# Patient Record
Sex: Female | Born: 1980 | Hispanic: Yes | Marital: Married | Smoking: Never smoker
Health system: Southern US, Community
[De-identification: ages and names within clinical notes are randomized; demographics above are authoritative.]

## PROBLEM LIST (undated history)

## (undated) ENCOUNTER — Inpatient Hospital Stay (HOSPITAL_COMMUNITY): Payer: Self-pay

## (undated) DIAGNOSIS — Z789 Other specified health status: Secondary | ICD-10-CM

## (undated) DIAGNOSIS — O24419 Gestational diabetes mellitus in pregnancy, unspecified control: Secondary | ICD-10-CM

## (undated) DIAGNOSIS — N96 Recurrent pregnancy loss: Secondary | ICD-10-CM

## (undated) DIAGNOSIS — J40 Bronchitis, not specified as acute or chronic: Secondary | ICD-10-CM

## (undated) HISTORY — PX: NO PAST SURGERIES: SHX2092

## (undated) HISTORY — DX: Gestational diabetes mellitus in pregnancy, unspecified control: O24.419

---
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
## 2000-01-30 ENCOUNTER — Emergency Department (HOSPITAL_COMMUNITY): Admission: EM | Admit: 2000-01-30 | Discharge: 2000-01-30 | Payer: Self-pay | Admitting: Emergency Medicine

## 2000-02-06 ENCOUNTER — Encounter: Admission: RE | Admit: 2000-02-06 | Discharge: 2000-02-06 | Payer: Self-pay | Admitting: Family Medicine

## 2000-02-18 ENCOUNTER — Encounter: Admission: RE | Admit: 2000-02-18 | Discharge: 2000-02-18 | Payer: Self-pay | Admitting: Family Medicine

## 2000-03-26 ENCOUNTER — Encounter: Admission: RE | Admit: 2000-03-26 | Discharge: 2000-03-26 | Payer: Self-pay | Admitting: Sports Medicine

## 2000-04-05 ENCOUNTER — Ambulatory Visit (HOSPITAL_COMMUNITY): Admission: RE | Admit: 2000-04-05 | Discharge: 2000-04-05 | Payer: Self-pay | Admitting: *Deleted

## 2000-04-30 ENCOUNTER — Encounter: Admission: RE | Admit: 2000-04-30 | Discharge: 2000-04-30 | Payer: Self-pay | Admitting: Family Medicine

## 2000-05-31 ENCOUNTER — Encounter: Admission: RE | Admit: 2000-05-31 | Discharge: 2000-05-31 | Payer: Self-pay | Admitting: Family Medicine

## 2000-06-16 ENCOUNTER — Encounter: Admission: RE | Admit: 2000-06-16 | Discharge: 2000-06-16 | Payer: Self-pay | Admitting: Family Medicine

## 2000-07-01 ENCOUNTER — Encounter: Admission: RE | Admit: 2000-07-01 | Discharge: 2000-07-01 | Payer: Self-pay | Admitting: Family Medicine

## 2000-07-12 ENCOUNTER — Encounter: Admission: RE | Admit: 2000-07-12 | Discharge: 2000-07-12 | Payer: Self-pay | Admitting: Family Medicine

## 2000-07-20 ENCOUNTER — Encounter: Admission: RE | Admit: 2000-07-20 | Discharge: 2000-07-20 | Payer: Self-pay | Admitting: Sports Medicine

## 2000-07-28 ENCOUNTER — Encounter: Admission: RE | Admit: 2000-07-28 | Discharge: 2000-07-28 | Payer: Self-pay | Admitting: Family Medicine

## 2000-08-05 ENCOUNTER — Encounter: Admission: RE | Admit: 2000-08-05 | Discharge: 2000-08-05 | Payer: Self-pay | Admitting: Family Medicine

## 2000-08-11 ENCOUNTER — Encounter (HOSPITAL_COMMUNITY): Admission: RE | Admit: 2000-08-11 | Discharge: 2000-08-16 | Payer: Self-pay | Admitting: Obstetrics

## 2000-08-11 ENCOUNTER — Encounter: Admission: RE | Admit: 2000-08-11 | Discharge: 2000-08-11 | Payer: Self-pay | Admitting: Family Medicine

## 2000-08-13 ENCOUNTER — Inpatient Hospital Stay (HOSPITAL_COMMUNITY): Admission: AD | Admit: 2000-08-13 | Discharge: 2000-08-16 | Payer: Self-pay | Admitting: *Deleted

## 2000-09-16 ENCOUNTER — Encounter: Admission: RE | Admit: 2000-09-16 | Discharge: 2000-09-16 | Payer: Self-pay | Admitting: Sports Medicine

## 2002-05-17 ENCOUNTER — Encounter: Admission: RE | Admit: 2002-05-17 | Discharge: 2002-05-17 | Payer: Self-pay | Admitting: Family Medicine

## 2002-05-24 ENCOUNTER — Encounter: Admission: RE | Admit: 2002-05-24 | Discharge: 2002-05-24 | Payer: Self-pay | Admitting: Family Medicine

## 2002-05-24 ENCOUNTER — Encounter (INDEPENDENT_AMBULATORY_CARE_PROVIDER_SITE_OTHER): Payer: Self-pay | Admitting: *Deleted

## 2002-05-30 ENCOUNTER — Encounter: Admission: RE | Admit: 2002-05-30 | Discharge: 2002-05-30 | Payer: Self-pay | Admitting: Sports Medicine

## 2002-06-01 ENCOUNTER — Ambulatory Visit (HOSPITAL_COMMUNITY): Admission: RE | Admit: 2002-06-01 | Discharge: 2002-06-01 | Payer: Self-pay | Admitting: Family Medicine

## 2002-06-28 ENCOUNTER — Encounter: Admission: RE | Admit: 2002-06-28 | Discharge: 2002-06-28 | Payer: Self-pay | Admitting: Family Medicine

## 2002-07-14 ENCOUNTER — Encounter: Admission: RE | Admit: 2002-07-14 | Discharge: 2002-07-14 | Payer: Self-pay | Admitting: Family Medicine

## 2002-07-25 ENCOUNTER — Encounter: Admission: RE | Admit: 2002-07-25 | Discharge: 2002-07-25 | Payer: Self-pay | Admitting: Family Medicine

## 2002-08-01 ENCOUNTER — Encounter: Admission: RE | Admit: 2002-08-01 | Discharge: 2002-08-01 | Payer: Self-pay | Admitting: Sports Medicine

## 2002-08-09 ENCOUNTER — Encounter: Admission: RE | Admit: 2002-08-09 | Discharge: 2002-08-09 | Payer: Self-pay | Admitting: Family Medicine

## 2002-08-14 ENCOUNTER — Ambulatory Visit (HOSPITAL_COMMUNITY): Admission: RE | Admit: 2002-08-14 | Discharge: 2002-08-14 | Payer: Self-pay | Admitting: Family Medicine

## 2002-08-14 ENCOUNTER — Encounter: Admission: RE | Admit: 2002-08-14 | Discharge: 2002-08-14 | Payer: Self-pay | Admitting: *Deleted

## 2002-08-17 ENCOUNTER — Encounter: Admission: RE | Admit: 2002-08-17 | Discharge: 2002-08-17 | Payer: Self-pay | Admitting: Family Medicine

## 2002-08-17 ENCOUNTER — Encounter: Admission: RE | Admit: 2002-08-17 | Discharge: 2002-08-17 | Payer: Self-pay | Admitting: *Deleted

## 2002-08-18 ENCOUNTER — Inpatient Hospital Stay (HOSPITAL_COMMUNITY): Admission: AD | Admit: 2002-08-18 | Discharge: 2002-08-20 | Payer: Self-pay | Admitting: *Deleted

## 2002-10-09 ENCOUNTER — Encounter: Admission: RE | Admit: 2002-10-09 | Discharge: 2002-10-09 | Payer: Self-pay | Admitting: Family Medicine

## 2002-11-15 ENCOUNTER — Encounter: Admission: RE | Admit: 2002-11-15 | Discharge: 2002-11-15 | Payer: Self-pay | Admitting: Family Medicine

## 2003-02-05 ENCOUNTER — Encounter: Admission: RE | Admit: 2003-02-05 | Discharge: 2003-02-05 | Payer: Self-pay | Admitting: Family Medicine

## 2003-09-27 ENCOUNTER — Encounter: Admission: RE | Admit: 2003-09-27 | Discharge: 2003-09-27 | Payer: Self-pay | Admitting: Family Medicine

## 2003-11-01 ENCOUNTER — Encounter: Admission: RE | Admit: 2003-11-01 | Discharge: 2003-11-01 | Payer: Self-pay | Admitting: Sports Medicine

## 2004-08-25 ENCOUNTER — Ambulatory Visit: Payer: Self-pay | Admitting: Family Medicine

## 2005-11-21 ENCOUNTER — Encounter (INDEPENDENT_AMBULATORY_CARE_PROVIDER_SITE_OTHER): Payer: Self-pay | Admitting: *Deleted

## 2005-12-09 ENCOUNTER — Ambulatory Visit: Payer: Self-pay | Admitting: Family Medicine

## 2005-12-16 ENCOUNTER — Ambulatory Visit: Payer: Self-pay | Admitting: Family Medicine

## 2006-05-21 ENCOUNTER — Encounter (INDEPENDENT_AMBULATORY_CARE_PROVIDER_SITE_OTHER): Payer: Self-pay | Admitting: *Deleted

## 2006-07-15 ENCOUNTER — Ambulatory Visit: Payer: Self-pay | Admitting: Sports Medicine

## 2007-02-04 ENCOUNTER — Ambulatory Visit: Payer: Self-pay | Admitting: Family Medicine

## 2007-02-07 ENCOUNTER — Ambulatory Visit: Payer: Self-pay | Admitting: Family Medicine

## 2007-02-23 ENCOUNTER — Ambulatory Visit: Payer: Self-pay | Admitting: Family Medicine

## 2007-02-23 ENCOUNTER — Encounter (INDEPENDENT_AMBULATORY_CARE_PROVIDER_SITE_OTHER): Payer: Self-pay | Admitting: Family Medicine

## 2007-02-23 LAB — CONVERTED CEMR LAB

## 2007-02-28 ENCOUNTER — Encounter: Payer: Self-pay | Admitting: Family Medicine

## 2007-02-28 LAB — CONVERTED CEMR LAB
Basophils Relative: 0 % (ref 0–1)
Eosinophils Relative: 1 % (ref 0–5)
Lymphs Abs: 1.8 10*3/uL (ref 0.7–4.0)
MCV: 90.7 fL (ref 78.0–100.0)
Neutro Abs: 4.8 10*3/uL (ref 1.7–7.7)
Neutrophils Relative %: 69 % (ref 43–77)
Platelets: 266 10*3/uL (ref 150–400)
RBC: 4.42 M/uL (ref 3.87–5.11)
RDW: 13.9 % (ref 11.5–15.5)
Rh Type: POSITIVE

## 2007-03-02 ENCOUNTER — Encounter (INDEPENDENT_AMBULATORY_CARE_PROVIDER_SITE_OTHER): Payer: Self-pay | Admitting: Family Medicine

## 2007-03-02 ENCOUNTER — Ambulatory Visit: Payer: Self-pay | Admitting: Family Medicine

## 2007-03-02 DIAGNOSIS — E669 Obesity, unspecified: Secondary | ICD-10-CM | POA: Insufficient documentation

## 2007-03-02 LAB — CONVERTED CEMR LAB
Blood in Urine, dipstick: NEGATIVE
Glucose, Urine, Semiquant: NEGATIVE
Nitrite: NEGATIVE
Urobilinogen, UA: 1
WBC Urine, dipstick: NEGATIVE
Whiff Test: NEGATIVE
pH: 8.5

## 2007-03-03 ENCOUNTER — Telehealth (INDEPENDENT_AMBULATORY_CARE_PROVIDER_SITE_OTHER): Payer: Self-pay | Admitting: *Deleted

## 2007-03-07 ENCOUNTER — Ambulatory Visit (HOSPITAL_COMMUNITY): Admission: RE | Admit: 2007-03-07 | Discharge: 2007-03-07 | Payer: Self-pay | Admitting: Sports Medicine

## 2007-03-07 ENCOUNTER — Encounter (INDEPENDENT_AMBULATORY_CARE_PROVIDER_SITE_OTHER): Payer: Self-pay | Admitting: *Deleted

## 2007-03-08 ENCOUNTER — Ambulatory Visit: Payer: Self-pay | Admitting: Family Medicine

## 2007-03-08 LAB — CONVERTED CEMR LAB: GTT: 168

## 2007-03-10 ENCOUNTER — Ambulatory Visit: Payer: Self-pay | Admitting: Family Medicine

## 2007-03-10 ENCOUNTER — Encounter (INDEPENDENT_AMBULATORY_CARE_PROVIDER_SITE_OTHER): Payer: Self-pay | Admitting: Family Medicine

## 2007-03-14 ENCOUNTER — Encounter (INDEPENDENT_AMBULATORY_CARE_PROVIDER_SITE_OTHER): Payer: Self-pay | Admitting: *Deleted

## 2007-03-18 ENCOUNTER — Inpatient Hospital Stay (HOSPITAL_COMMUNITY): Admission: AD | Admit: 2007-03-18 | Discharge: 2007-03-18 | Payer: Self-pay | Admitting: Obstetrics and Gynecology

## 2007-03-20 ENCOUNTER — Inpatient Hospital Stay (HOSPITAL_COMMUNITY): Admission: AD | Admit: 2007-03-20 | Discharge: 2007-03-21 | Payer: Self-pay | Admitting: Obstetrics & Gynecology

## 2007-03-20 ENCOUNTER — Encounter: Payer: Self-pay | Admitting: Obstetrics & Gynecology

## 2007-03-23 ENCOUNTER — Telehealth (INDEPENDENT_AMBULATORY_CARE_PROVIDER_SITE_OTHER): Payer: Self-pay | Admitting: *Deleted

## 2007-04-06 ENCOUNTER — Ambulatory Visit: Payer: Self-pay | Admitting: Family Medicine

## 2007-04-06 LAB — CONVERTED CEMR LAB: Whiff Test: NEGATIVE

## 2007-07-22 ENCOUNTER — Encounter: Payer: Self-pay | Admitting: *Deleted

## 2007-08-03 ENCOUNTER — Ambulatory Visit: Payer: Self-pay | Admitting: Family Medicine

## 2007-10-17 ENCOUNTER — Encounter (INDEPENDENT_AMBULATORY_CARE_PROVIDER_SITE_OTHER): Payer: Self-pay | Admitting: Family Medicine

## 2007-10-17 ENCOUNTER — Ambulatory Visit: Payer: Self-pay | Admitting: Family Medicine

## 2007-10-17 LAB — CONVERTED CEMR LAB
Basophils Absolute: 0 10*3/uL (ref 0.0–0.1)
Hepatitis B Surface Ag: NEGATIVE
Lymphocytes Relative: 25 % (ref 12–46)
MCHC: 33 g/dL (ref 30.0–36.0)
MCV: 90.2 fL (ref 78.0–100.0)
Monocytes Absolute: 0.3 10*3/uL (ref 0.1–1.0)
Monocytes Relative: 4 % (ref 3–12)
Neutro Abs: 5 10*3/uL (ref 1.7–7.7)
Neutrophils Relative %: 69 % (ref 43–77)
Platelets: 251 10*3/uL (ref 150–400)
WBC: 7.3 10*3/uL (ref 4.0–10.5)

## 2007-10-24 ENCOUNTER — Encounter (INDEPENDENT_AMBULATORY_CARE_PROVIDER_SITE_OTHER): Payer: Self-pay | Admitting: Family Medicine

## 2007-10-24 ENCOUNTER — Ambulatory Visit: Payer: Self-pay | Admitting: Sports Medicine

## 2007-10-24 LAB — CONVERTED CEMR LAB
Chlamydia, DNA Probe: NEGATIVE
GC Probe Amp, Genital: NEGATIVE
Glucose, Urine, Semiquant: NEGATIVE

## 2007-10-25 ENCOUNTER — Encounter (INDEPENDENT_AMBULATORY_CARE_PROVIDER_SITE_OTHER): Payer: Self-pay | Admitting: Family Medicine

## 2007-10-26 ENCOUNTER — Ambulatory Visit: Payer: Self-pay | Admitting: Family Medicine

## 2007-10-26 LAB — CONVERTED CEMR LAB: GTT, 1 hr: 163 mg/dL

## 2007-11-01 ENCOUNTER — Encounter (INDEPENDENT_AMBULATORY_CARE_PROVIDER_SITE_OTHER): Payer: Self-pay | Admitting: Family Medicine

## 2007-11-01 ENCOUNTER — Ambulatory Visit: Payer: Self-pay | Admitting: Family Medicine

## 2007-11-18 ENCOUNTER — Encounter (INDEPENDENT_AMBULATORY_CARE_PROVIDER_SITE_OTHER): Payer: Self-pay | Admitting: Family Medicine

## 2007-11-18 ENCOUNTER — Ambulatory Visit: Payer: Self-pay | Admitting: Family Medicine

## 2007-11-18 LAB — CONVERTED CEMR LAB: Glucose, Urine, Semiquant: NEGATIVE

## 2007-12-13 ENCOUNTER — Encounter (INDEPENDENT_AMBULATORY_CARE_PROVIDER_SITE_OTHER): Payer: Self-pay | Admitting: Family Medicine

## 2007-12-13 ENCOUNTER — Ambulatory Visit (HOSPITAL_COMMUNITY): Admission: RE | Admit: 2007-12-13 | Discharge: 2007-12-13 | Payer: Self-pay | Admitting: Family Medicine

## 2007-12-19 ENCOUNTER — Ambulatory Visit: Payer: Self-pay | Admitting: Family Medicine

## 2007-12-19 LAB — CONVERTED CEMR LAB: Glucose, Urine, Semiquant: NEGATIVE

## 2008-01-12 ENCOUNTER — Telehealth: Payer: Self-pay | Admitting: *Deleted

## 2008-01-13 ENCOUNTER — Ambulatory Visit: Payer: Self-pay | Admitting: Family Medicine

## 2008-01-17 ENCOUNTER — Ambulatory Visit: Payer: Self-pay | Admitting: Family Medicine

## 2008-01-17 LAB — CONVERTED CEMR LAB: Glucose, Urine, Semiquant: NEGATIVE

## 2008-02-13 ENCOUNTER — Encounter (INDEPENDENT_AMBULATORY_CARE_PROVIDER_SITE_OTHER): Payer: Self-pay | Admitting: Family Medicine

## 2008-02-13 ENCOUNTER — Ambulatory Visit: Payer: Self-pay | Admitting: Family Medicine

## 2008-02-13 LAB — CONVERTED CEMR LAB

## 2008-02-20 ENCOUNTER — Ambulatory Visit: Payer: Self-pay | Admitting: Family Medicine

## 2008-02-20 ENCOUNTER — Encounter (INDEPENDENT_AMBULATORY_CARE_PROVIDER_SITE_OTHER): Payer: Self-pay | Admitting: Family Medicine

## 2008-02-28 ENCOUNTER — Ambulatory Visit: Payer: Self-pay | Admitting: Family Medicine

## 2008-03-13 ENCOUNTER — Ambulatory Visit: Payer: Self-pay | Admitting: Family Medicine

## 2008-03-13 ENCOUNTER — Encounter (INDEPENDENT_AMBULATORY_CARE_PROVIDER_SITE_OTHER): Payer: Self-pay | Admitting: Family Medicine

## 2008-03-29 ENCOUNTER — Ambulatory Visit: Payer: Self-pay | Admitting: Family Medicine

## 2008-04-12 ENCOUNTER — Encounter (INDEPENDENT_AMBULATORY_CARE_PROVIDER_SITE_OTHER): Payer: Self-pay | Admitting: Family Medicine

## 2008-04-12 ENCOUNTER — Ambulatory Visit: Payer: Self-pay | Admitting: Family Medicine

## 2008-04-18 ENCOUNTER — Encounter (INDEPENDENT_AMBULATORY_CARE_PROVIDER_SITE_OTHER): Payer: Self-pay | Admitting: Family Medicine

## 2008-04-19 ENCOUNTER — Ambulatory Visit: Payer: Self-pay | Admitting: Family Medicine

## 2008-04-26 ENCOUNTER — Ambulatory Visit: Payer: Self-pay | Admitting: Family Medicine

## 2008-05-02 ENCOUNTER — Ambulatory Visit: Payer: Self-pay | Admitting: Family Medicine

## 2008-05-09 ENCOUNTER — Ambulatory Visit: Payer: Self-pay | Admitting: Family Medicine

## 2008-05-11 ENCOUNTER — Ambulatory Visit: Payer: Self-pay | Admitting: Obstetrics & Gynecology

## 2008-05-15 ENCOUNTER — Ambulatory Visit: Payer: Self-pay | Admitting: Family Medicine

## 2008-05-15 ENCOUNTER — Ambulatory Visit: Payer: Self-pay | Admitting: Obstetrics & Gynecology

## 2008-05-17 ENCOUNTER — Inpatient Hospital Stay (HOSPITAL_COMMUNITY): Admission: AD | Admit: 2008-05-17 | Discharge: 2008-05-18 | Payer: Self-pay | Admitting: Obstetrics & Gynecology

## 2008-05-17 ENCOUNTER — Ambulatory Visit: Payer: Self-pay | Admitting: Advanced Practice Midwife

## 2008-05-17 ENCOUNTER — Ambulatory Visit: Payer: Self-pay | Admitting: Family Medicine

## 2008-06-28 ENCOUNTER — Ambulatory Visit: Payer: Self-pay | Admitting: Family Medicine

## 2008-06-28 DIAGNOSIS — R51 Headache: Secondary | ICD-10-CM | POA: Insufficient documentation

## 2008-06-28 DIAGNOSIS — R300 Dysuria: Secondary | ICD-10-CM | POA: Insufficient documentation

## 2008-06-28 DIAGNOSIS — R519 Headache, unspecified: Secondary | ICD-10-CM | POA: Insufficient documentation

## 2008-06-28 LAB — CONVERTED CEMR LAB
Ketones, urine, test strip: NEGATIVE
Nitrite: NEGATIVE
Protein, U semiquant: NEGATIVE
Specific Gravity, Urine: 1.015
Urobilinogen, UA: 0.2
WBC Urine, dipstick: NEGATIVE
pH: 7

## 2008-08-07 ENCOUNTER — Telehealth (INDEPENDENT_AMBULATORY_CARE_PROVIDER_SITE_OTHER): Payer: Self-pay | Admitting: *Deleted

## 2008-08-15 ENCOUNTER — Ambulatory Visit: Payer: Self-pay | Admitting: Family Medicine

## 2008-08-15 ENCOUNTER — Encounter (INDEPENDENT_AMBULATORY_CARE_PROVIDER_SITE_OTHER): Payer: Self-pay | Admitting: Family Medicine

## 2008-08-15 LAB — CONVERTED CEMR LAB: Beta hcg, urine, semiquantitative: NEGATIVE

## 2008-11-06 ENCOUNTER — Ambulatory Visit: Payer: Self-pay | Admitting: Family Medicine

## 2008-12-15 IMAGING — US US OB DETAIL+14 WK
2 series · 14 of 28 positions shown · non-contrast
Comparison: none

OBSTETRICAL ULTRASOUND:
 This ultrasound exam was performed in the [HOSPITAL] Ultrasound Department.  The OB US report was generated in the AS system, and faxed to the ordering physician.  This report is also available in [REDACTED] PACS.

[Series 1: us ob detail +14 wk · 76 acquisitions, 13 frames shown (1 of 2)]
[im 3/76]
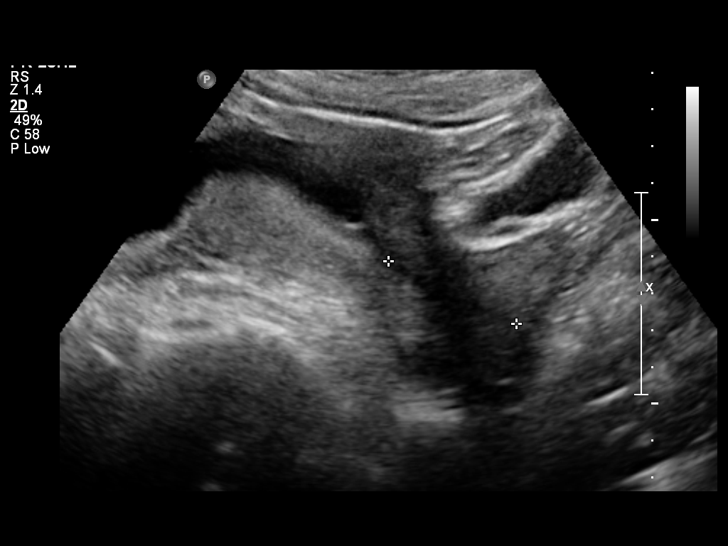
[im 9/76]
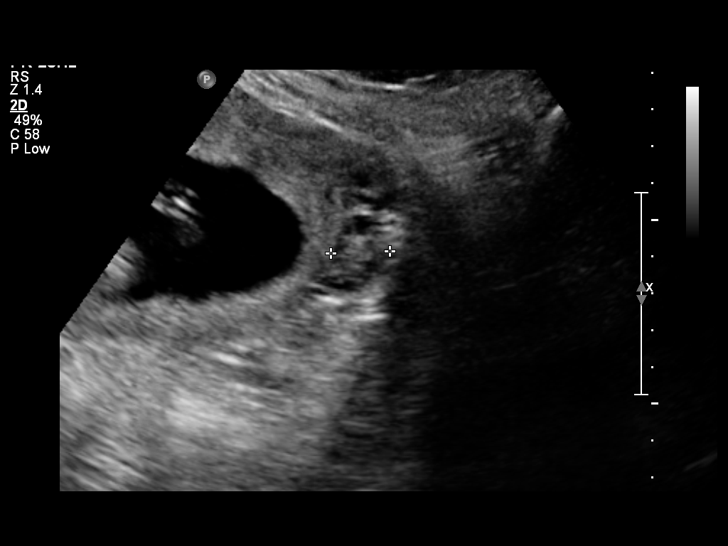
[im 15/76]
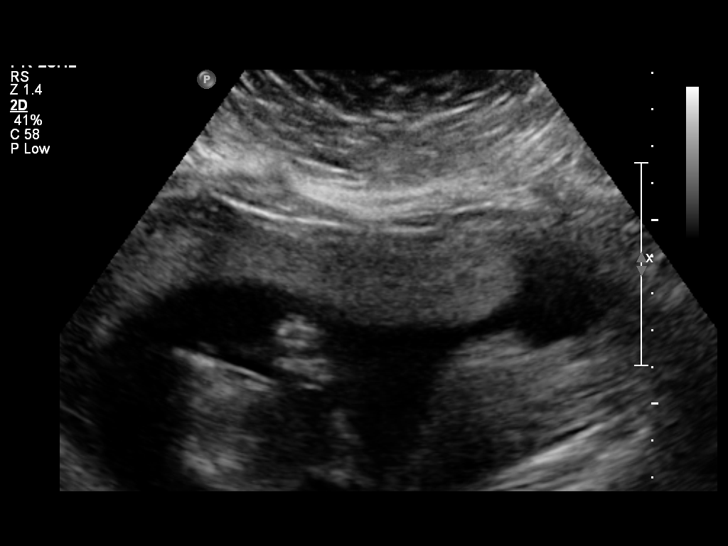
[im 21/76]
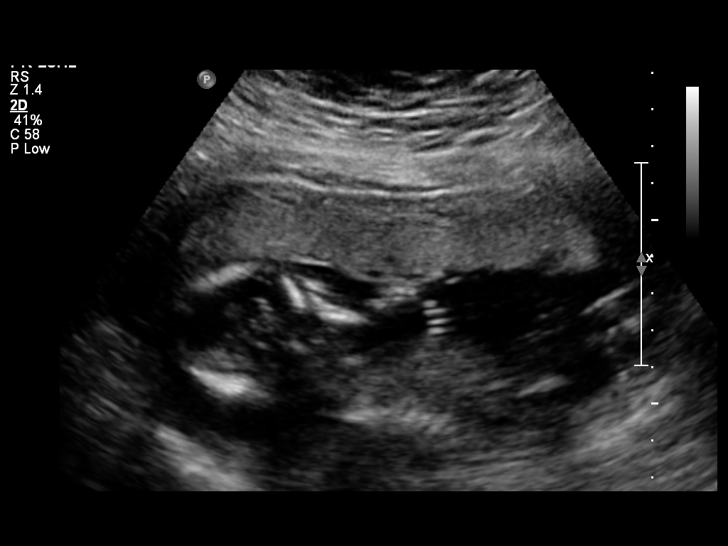
[im 26/76]
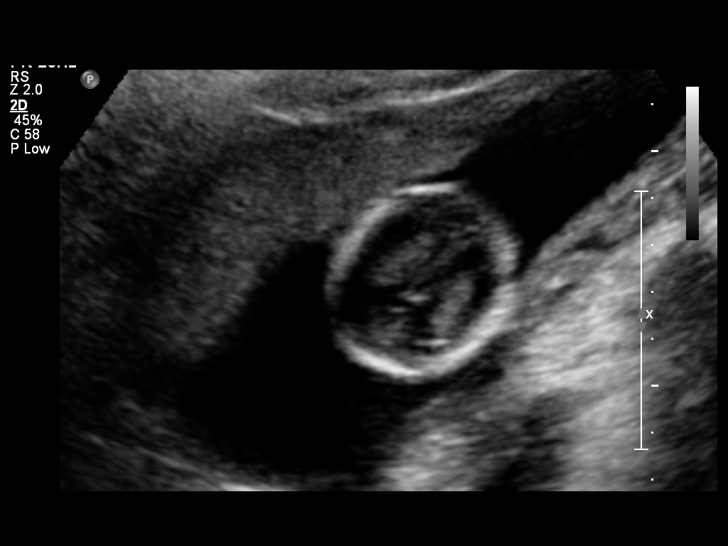
[im 32/76]
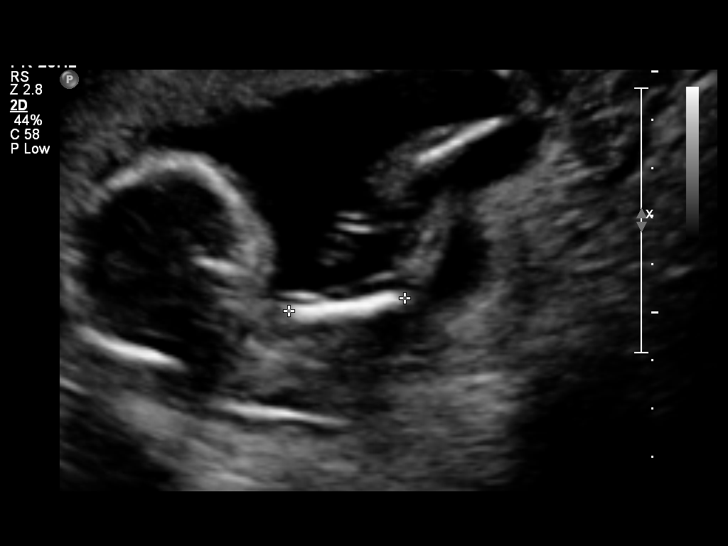
[im 38/76]
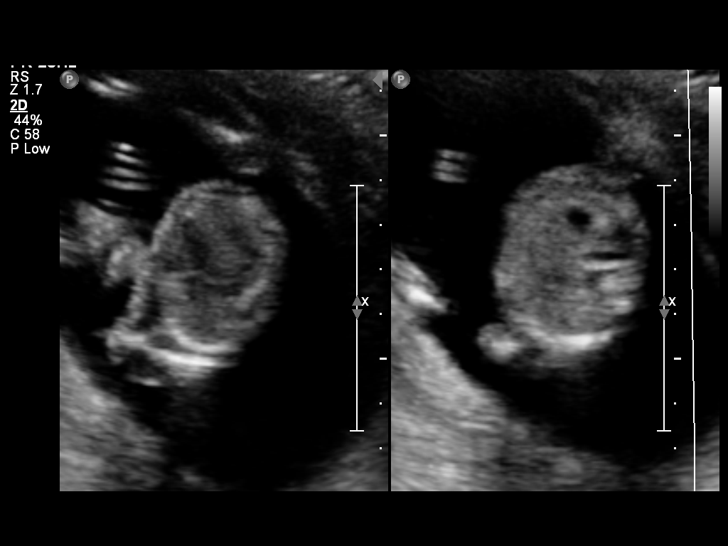
[im 44/76]
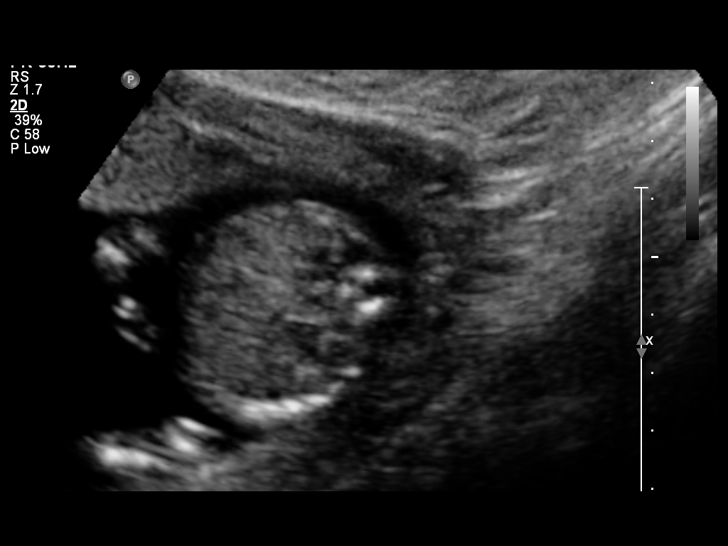
[im 50/76]
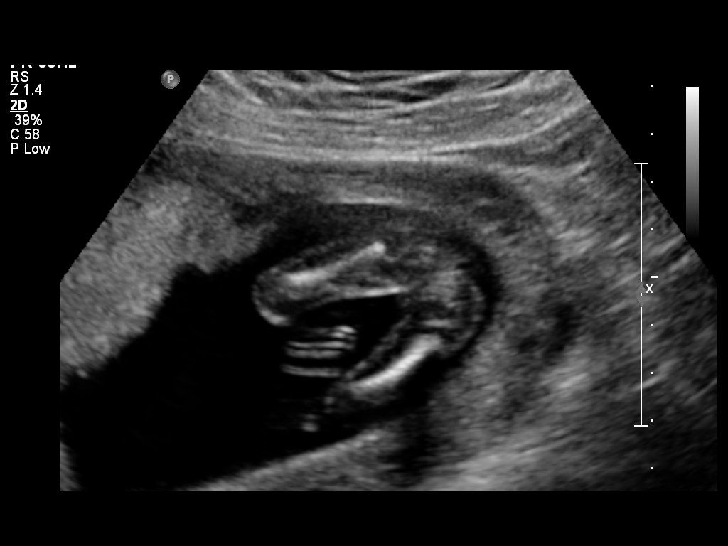
[im 55/76]
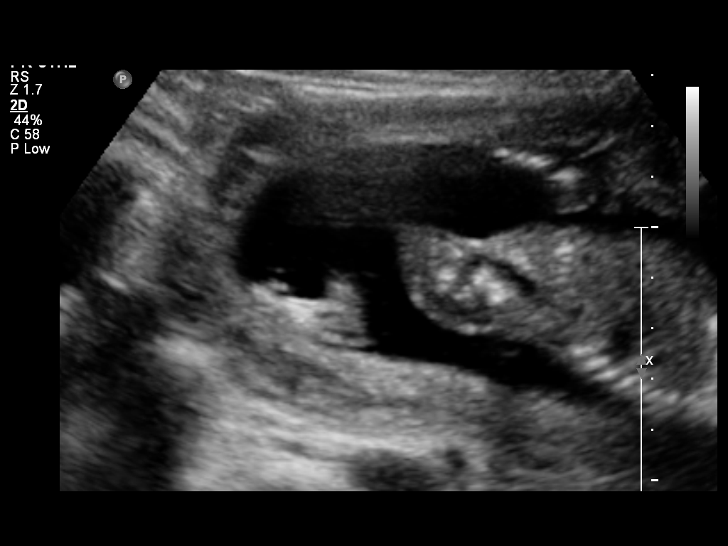
[im 61/76]
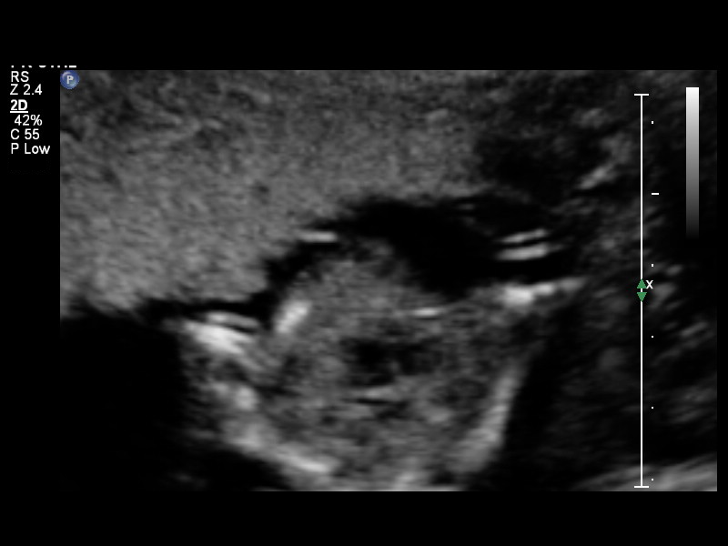
[im 67/76]
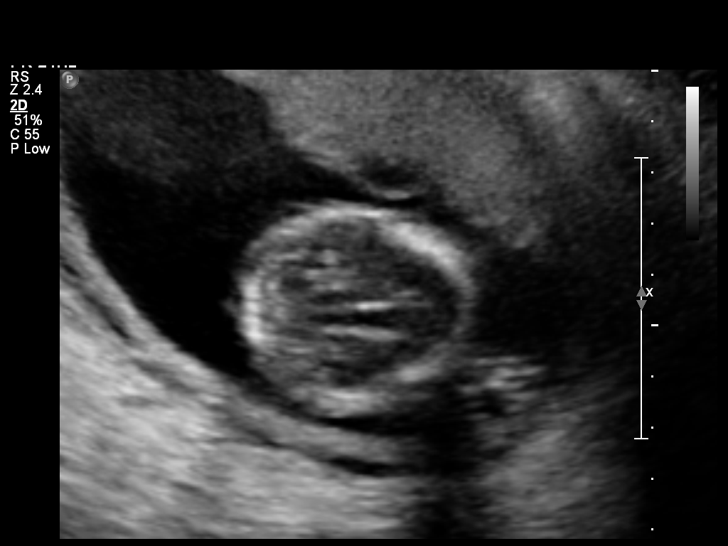
[im 73/76]
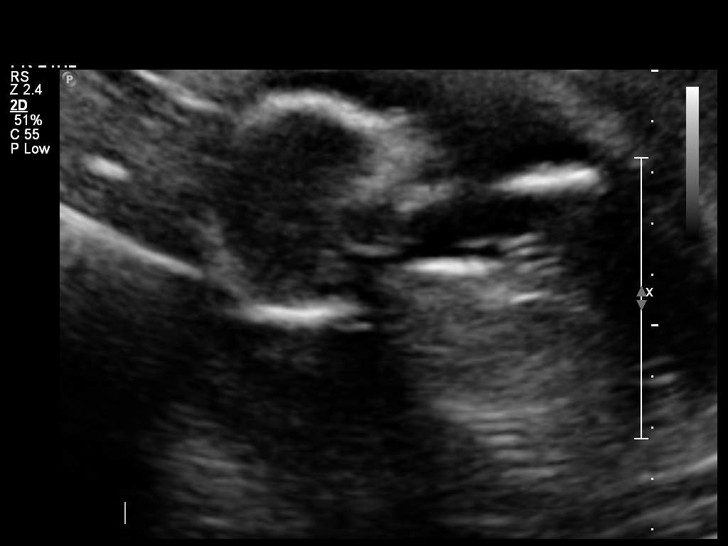

[Series 1: us ob detail +14 wk · 1 of 2 slices shown (2 of 2)]
[im 1/2]
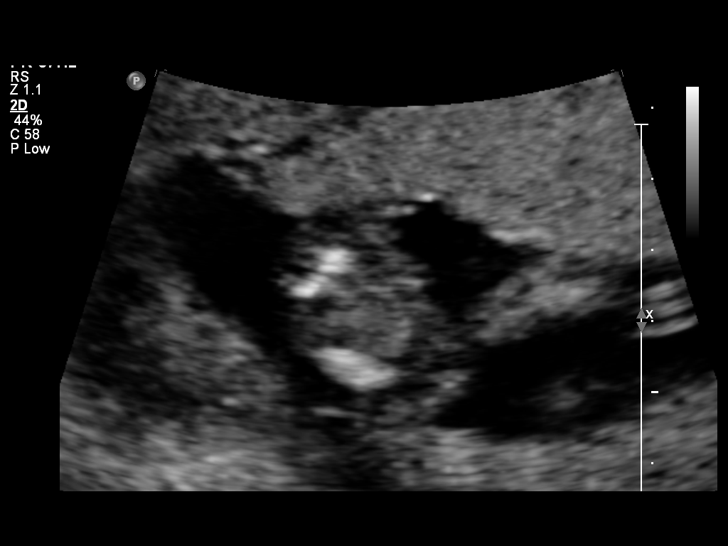

[14 of 28 positions shown; findings below may reference images not displayed]

IMPRESSION: See AS Obstetric US report.

## 2009-06-03 ENCOUNTER — Inpatient Hospital Stay (HOSPITAL_COMMUNITY): Admission: AD | Admit: 2009-06-03 | Discharge: 2009-06-03 | Payer: Self-pay | Admitting: Obstetrics & Gynecology

## 2009-06-07 ENCOUNTER — Encounter: Payer: Self-pay | Admitting: Family Medicine

## 2009-06-11 ENCOUNTER — Encounter (INDEPENDENT_AMBULATORY_CARE_PROVIDER_SITE_OTHER): Payer: Self-pay | Admitting: Family Medicine

## 2009-06-11 ENCOUNTER — Ambulatory Visit: Payer: Self-pay | Admitting: Family Medicine

## 2009-06-11 LAB — CONVERTED CEMR LAB
MCV: 93.5 fL (ref 78.0–100.0)
Platelets: 219 10*3/uL (ref 150–400)
WBC: 4.9 10*3/uL (ref 4.0–10.5)
hCG, Beta Chain, Quant, S: 14.5 milliintl units/mL

## 2009-06-12 ENCOUNTER — Telehealth: Payer: Self-pay | Admitting: *Deleted

## 2010-04-13 ENCOUNTER — Encounter: Payer: Self-pay | Admitting: Sports Medicine

## 2010-04-13 ENCOUNTER — Encounter: Payer: Self-pay | Admitting: Family Medicine

## 2010-04-22 NOTE — Assessment & Plan Note (Signed)
Summary: ob visit/per ennis/eo   Vital Signs:  Patient Profile:   30 Years Old Female Height:     60 inches Weight:      180.1 pounds BMI:     35.30 Pulse rate:   96 / minute BP sitting:   101 / 71  (left arm)  Pt. in pain?   no  Vitals Entered By: Modesta Messing LPN (April 19, 2008 1:48 PM)                  Chief Complaint:  Follow up OB..    Current Allergies: No known allergies     Risk Factors:       Impression & Recommendations:  Problem # 1:  PREGNANCY, NORMAL, MULTIGRAVIDA (ICD-V22.1) Assessment: Unchanged 30 yo O+ [id number] at 36 6/7 (EDC 05/11/08) by sure LMP (18 week Anatomy scan [facility] 05/19/08 - thus kept LMP date).  Had 1st trimester SAB in 12/08.  Taking Prenatal vitamins.  Declined Integrated/Quad Screen and CF Screening.  Anatomy scan normal.  Passed early 3 hr GTT screen.  Failed 1 hr GTT, but passed 3 hr GTT at 27 weeks.   Gaining weight appropriately - not too much.  HIV, RPR, and Hgb normal at 27 weeks.  H1N1 and Flu vaccine given previously. GC/CT negative. GBS negative.  FHT reassuring with good fetal movement.  RTC in 1 week.  Labor precautions.    Baby Girl, Mia Pediatrician: [facility] Breast/Bottle Feeding:  Breast PP Contraception:  Depo prior to leaving hospital, then Mirena   Orders: Other OB visit- [facility] ([facility])   Complete Medication List: 1)  Prenatal Vitamins 0.8 Mg Tabs (Prenatal multivit-min-fe-fa) .... One daily   Patient Instructions: 1)  Please schedule a return OB visit in 1 week with Dr. Deirdre Peer. 2)  Llame la clinica si tiene mas pression, calambres, contracciones, flujo, o sangrando.  Tambien llame si no puede sentir su bebe [employer]. 3)  Si no hay alguien a la clinica y tiene problemas, vaya a la hospital de [place]. 4)  Continue sus vitaminas diario.      Flowsheet View for Follow-up Visit    Estimated weeks of       gestation:     36 6/7    Weight:     180.1    Blood pressure:   101 / 71    Hx headache?     No  Nausea/vomiting?   No    Edema?     TrLE    Bleeding?     no    Leakage/discharge?   no    Fetal activity:       yes    Labor symptoms?   few ctx    Fundal height:      38    FHR:       140s    Cx dilation:     FT    Cx effacement:   30%    Fetal station:     -3    Taking Vitamins?   Y    Smoking PPD:   n/a    Comment:     Doing well. No concerns. Few contractions.     Next visit:     1 wk    Resident:     Earlene Plater    Preceptor:     Chambliss  Prenatal Visit      This is a 30 years old female G4, T2, PT0, LC2 who is in for a prenatal visit.  Since the last visit, she notes that she is doing well and has no concerns.

## 2010-04-22 NOTE — Progress Notes (Signed)
  Phone Note Outgoing Call   Call placed by: Jettie Pagan MD Call placed to: Patient Reason for Call: Discuss lab or test results Summary of Call: Spoke with patient using interpreter Eda Royal at [facility], regarding results of labs for follow up on SAB (miscarriage).  BHCG 14.5, trended down appropriately and CBC normal.  At this time a TVUS is not necessary as labs are reassuring that SAB is now complete.  Patient states pain relieved with Ibuprofen.  Patient is self-pay.  Please cancel Ultrasound.  Will also refill her OCPs at her request.  She would like an IUD placed in the near future (6 wks post SAB).  She will call to make an appointment for IUD placement.  Thank you - Jettie Pagan MD  Follow-up for Phone Call        Ultrasound canceled. Follow-up by: Garen Grams LPN,  June 12, 2009 11:54 AM    Prescriptions: SPRINTEC 28 0.25-35 MG-MCG TABS (NORGESTIMATE-ETH ESTRADIOL) One tablet by mouth at the same time each day for birth control. Start the first pill on Sunday 11/11/08.  #1 x 12   Entered and Authorized by:   Leslie Drapiza MD   Signed by:   Leslie Drapiza MD on 06/12/2009   Method used:   [facility]  [address] [id number]       [address]       [place], [place]  [address]       Ph: [phone]       Fax: [phone]   RxID:   [id number]

## 2010-04-22 NOTE — Assessment & Plan Note (Signed)
Summary: f/u miscarriage/[place]/breen   Vital Signs:  Patient profile:   30 year old female Height:      60 inches Weight:      169 pounds BMI:     33.12 BSA:     1.74 Temp:     97.9 degrees F Pulse rate:   67 / minute BP sitting:   117 / 79  Vitals Entered By: Jone Baseman CMA (June 11, 2009 9:09 AM) CC: f/u miscarriage Is Patient Diabetic? No Pain Assessment Patient in pain? yes     Location: stomach Intensity: 4 Type: cramps   Primary Care Provider:  Drue Dun MD  CC:  f/u miscarriage.  History of Present Illness: Patient is a [id number] hispainc female, here today for continued pain after miscarriage one week ago.  She was seen at [facility] [place] at that time c/w Missed Ab.  Patient states she went home and the next day passed tissue that looked like a small sac.  She continued to bleed for 4 days, then it stopped abruptly. She has been taking Ibuprofen 200mg  two times a day for the pain.  Although the pregnancy was unintentional, she was not on birth control at the time of conception. She was on OCPs previously.  We discussed that her Ibuprofen dose was likely not high enough.  Her main concern is that this miscarriage was very different from her last one, where she bled for much longer and her pain was not as severe.  Interview conducted with Spanish interpreter.  Habits & Providers  Alcohol-Tobacco-Diet     Tobacco Status: never  Allergies: No Known Drug Allergies  Physical Exam  General:  Well-developed,well-nourished,in no acute distress; alert,appropriate and cooperative throughout examination Lungs:  Normal respiratory effort, chest expands symmetrically. Lungs are clear to auscultation, no crackles or wheezes. Heart:  Normal rate and regular rhythm. S1 and S2 normal without gallop, murmur, click, rub or other extra sounds. Abdomen:  Bowel sounds positive,abdomen soft and  mildly tender in suprapubic area and LLQ, without masses.   Impression &  Recommendations:  Problem # 1:  MISSED ABORTION (ICD-632) Will check BHCG to insure it is trending down.  Will also check CBC and TVUS to r/o retained POCs or other pathology.  Will contact patient with results. [phone]. Rx for higher dose of Ibuprofen.  Orders: CBC-[facility] ([address]) B-HCG Quant-[facility] ([id number]) Ultrasound (Ultrasound) [facility]- Est Level  3 ([address]657)  Complete Medication List: 1)  Prenatal Vitamins 0.8 Mg Tabs (Prenatal multivit-min-fe-fa) .... One daily 2)  Sprintec 28 0.25-35 Mg-mcg Tabs (Norgestimate-eth estradiol) .... One tablet by mouth at the same time each day for birth control. start the first pill on sunday 11/11/08. 3)  Ibuprofen 600 Mg Tabs (Ibuprofen) .... One tablet by mouth q 6 hours as needed pain Prescriptions: IBUPROFEN 600 MG TABS (IBUPROFEN) one tablet by mouth q 6 hours as needed pain  #30 x 0   Entered and Authorized by:   Ubaldo Daywalt MD   Signed by:   Kirrah Mustin MD on 06/11/2009   Method used:   [facility]  [address] [id number]       [address]       [place], [place]  [address]       Ph: [phone]       Fax: [phone]   RxID:   [id number]

## 2010-04-22 NOTE — Miscellaneous (Signed)
Summary: pain  Clinical Lists Changes pt called c/o pain from miscarriage . work in at [employer]. interpretor arranged.Golden Circle RN  June 07, 2009 9:17 AM

## 2010-05-14 ENCOUNTER — Encounter: Payer: Self-pay | Admitting: *Deleted

## 2010-06-13 LAB — CBC
MCHC: 34.2 g/dL (ref 30.0–36.0)
MCV: 92 fL (ref 78.0–100.0)
Platelets: 197 10*3/uL (ref 150–400)
RBC: 4.03 MIL/uL (ref 3.87–5.11)
WBC: 5.6 10*3/uL (ref 4.0–10.5)

## 2010-06-13 LAB — POCT PREGNANCY, URINE: Preg Test, Ur: POSITIVE

## 2010-06-13 LAB — GC/CHLAMYDIA PROBE AMP, GENITAL: Chlamydia, DNA Probe: NEGATIVE

## 2010-06-13 LAB — WET PREP, GENITAL: Trich, Wet Prep: NONE SEEN

## 2010-06-13 LAB — URINALYSIS, ROUTINE W REFLEX MICROSCOPIC
Bilirubin Urine: NEGATIVE
Ketones, ur: NEGATIVE mg/dL
Leukocytes, UA: NEGATIVE
Nitrite: NEGATIVE
Urobilinogen, UA: 0.2 mg/dL (ref 0.0–1.0)
pH: 5.5 (ref 5.0–8.0)

## 2010-06-13 LAB — HCG, QUANTITATIVE, PREGNANCY: hCG, Beta Chain, Quant, S: 5991 m[IU]/mL — ABNORMAL HIGH (ref <5)

## 2010-06-13 LAB — ABO/RH: ABO/RH(D): O POS

## 2010-07-08 LAB — CBC
HCT: 30.5 % — ABNORMAL LOW (ref 36.0–46.0)
HCT: 36.5 % (ref 36.0–46.0)
Hemoglobin: 10.3 g/dL — ABNORMAL LOW (ref 12.0–15.0)
Hemoglobin: 12.1 g/dL (ref 12.0–15.0)
MCHC: 33.7 g/dL (ref 30.0–36.0)
MCV: 86.7 fL (ref 78.0–100.0)
MCV: 86.8 fL (ref 78.0–100.0)
RBC: 3.52 MIL/uL — ABNORMAL LOW (ref 3.87–5.11)
RDW: 17.5 % — ABNORMAL HIGH (ref 11.5–15.5)
WBC: 7.3 10*3/uL (ref 4.0–10.5)

## 2010-11-18 ENCOUNTER — Encounter: Payer: Self-pay | Admitting: Family Medicine

## 2010-11-18 ENCOUNTER — Other Ambulatory Visit (HOSPITAL_COMMUNITY)
Admission: RE | Admit: 2010-11-18 | Discharge: 2010-11-18 | Disposition: A | Discharge status: Home / Self Care | Payer: Self-pay | Source: Ambulatory Visit | Attending: Family Medicine | Admitting: Family Medicine

## 2010-11-18 ENCOUNTER — Ambulatory Visit (INDEPENDENT_AMBULATORY_CARE_PROVIDER_SITE_OTHER): Payer: Self-pay | Admitting: Family Medicine

## 2010-11-18 VITALS — BP 119/78 | HR 72 | Wt 175.0 lb

## 2010-11-18 DIAGNOSIS — R300 Dysuria: Secondary | ICD-10-CM

## 2010-11-18 DIAGNOSIS — Z124 Encounter for screening for malignant neoplasm of cervix: Secondary | ICD-10-CM

## 2010-11-18 DIAGNOSIS — Z01419 Encounter for gynecological examination (general) (routine) without abnormal findings: Secondary | ICD-10-CM | POA: Insufficient documentation

## 2010-11-18 DIAGNOSIS — Z309 Encounter for contraceptive management, unspecified: Secondary | ICD-10-CM | POA: Insufficient documentation

## 2010-11-18 LAB — POCT UA - MICROSCOPIC ONLY

## 2010-11-18 LAB — POCT URINALYSIS DIPSTICK
Ketones, UA: NEGATIVE
Protein, UA: NEGATIVE
Spec Grav, UA: 1.025
pH, UA: 5.5

## 2010-11-18 MED ORDER — NORGESTIMATE-ETH ESTRADIOL 0.25-35 MG-MCG PO TABS: 1.0000 | ORAL_TABLET | Freq: Every day | ORAL | Status: DC

## 2010-11-18 NOTE — Assessment & Plan Note (Signed)
Patient prefers to have IUD removed and return to OCPs.  Nonsmoker, previously tolerated SPrintec without difficulty. IUD removed today.  Counseled to use alternative method to prevent pregnancy for the first month of OCP use.  PAP collected today.  UA collected due to ROS positive for occasional dysuria, UA negative.

## 2010-11-18 NOTE — Progress Notes (Signed)
  Subjective:    Patient ID: Amber Branch, female    DOB: 1980/06/11, 30 y.o.   MRN: [id number]  HPI Visit conducted in Spanish.  Patient comes in with complaint of pelvic bloating and dyspareunia that began after placement of Mirena IUD in March 2011 at [facility] Department.  She has been uncomfortable with the absence of menses which disappeared about 4 months after IUD placement. Feels emotional lability which started after IUD placement.  She did not ever have depressive symptoms before; now she is prone to crying without cause, Gets aggravated with children very easily.   Gets tired easily.   Would like the IUD removed; prefers to have menses.  Had been on Sprintec OCPs before IUD; tolerated them well. Switched to IUD for convenience.   Review of Systems  Denies fevers or chjlls.  Denies vaginal discharge.  Reports occasional urinary discomfort when she voids.      Objective:   Physical Exam Well appearing, no apparent distress.  HEENT Neck supple. MMM Pelvic: Abdomen soft and nontender. No masses.  No CMT on bimanual. Cervix not friable.  IUD strings visible.  Removed without difficulty.       Assessment & Plan:

## 2010-11-18 NOTE — Patient Instructions (Addendum)
Fue un Research officer, trade union.  Hoy sacamos el dispositivo; mande' la receta para las pastillas a la farmacia en [employer] [employer].  Debe usar otro metodo (como el preservativo) por el proximo mes para Location manager.  Quiero verle de nuevo en 4 a 6 semanas.  Le llamo con el resultado de la prueba de orina que hicimos en la oficina hoy.  PATIENT FOR FOLLOW UP APPT WITH DR Mauricio Po IN 4 TO 6 WEEKS  DAUGHTER MYA TOLEDO-CRUZ FOR APPT WITH DR Mauricio Po ALSO, IN COMING 2 WEEKS

## 2010-11-20 ENCOUNTER — Encounter: Payer: Self-pay | Admitting: Family Medicine

## 2010-12-12 ENCOUNTER — Encounter: Payer: Self-pay | Admitting: Family Medicine

## 2010-12-12 ENCOUNTER — Ambulatory Visit (INDEPENDENT_AMBULATORY_CARE_PROVIDER_SITE_OTHER): Payer: Self-pay | Admitting: Family Medicine

## 2010-12-12 VITALS — BP 97/70 | HR 74 | Temp 98.3°F | Wt 177.7 lb

## 2010-12-12 DIAGNOSIS — L309 Dermatitis, unspecified: Secondary | ICD-10-CM

## 2010-12-12 DIAGNOSIS — E669 Obesity, unspecified: Secondary | ICD-10-CM

## 2010-12-12 DIAGNOSIS — Z309 Encounter for contraceptive management, unspecified: Secondary | ICD-10-CM

## 2010-12-12 DIAGNOSIS — R5383 Other fatigue: Secondary | ICD-10-CM

## 2010-12-12 DIAGNOSIS — Z23 Encounter for immunization: Secondary | ICD-10-CM

## 2010-12-12 DIAGNOSIS — L259 Unspecified contact dermatitis, unspecified cause: Secondary | ICD-10-CM

## 2010-12-12 DIAGNOSIS — R5381 Other malaise: Secondary | ICD-10-CM

## 2010-12-12 LAB — COMPREHENSIVE METABOLIC PANEL
ALT: 12 U/L (ref 0–35)
AST: 14 U/L (ref 0–37)
Calcium: 9 mg/dL (ref 8.4–10.5)
Chloride: 101 mEq/L (ref 96–112)
Creat: 0.38 mg/dL — ABNORMAL LOW (ref 0.50–1.10)
Potassium: 4.1 mEq/L (ref 3.5–5.3)
Sodium: 138 mEq/L (ref 135–145)
Total Protein: 7.3 g/dL (ref 6.0–8.3)

## 2010-12-12 LAB — TSH: TSH: 1.323 u[IU]/mL (ref 0.350–4.500)

## 2010-12-12 LAB — LIPID PANEL: Total CHOL/HDL Ratio: 4 Ratio

## 2010-12-12 MED ORDER — TRIAMCINOLONE ACETONIDE 0.5 % EX OINT: TOPICAL_OINTMENT | Freq: Two times a day (BID) | CUTANEOUS | Status: DC

## 2010-12-12 NOTE — Assessment & Plan Note (Signed)
Has worsening problems with low energy and perceived weight gain. Increased stressors at home.  Depression screen today is negative.  Will do lab evaluation and to call her at [phone] cell phone when results available.   She has family history of DM and she has acanthosis nigricans; today's labs are fasting (she confirms that she has not had anything to eat or drink today); therefore, any sugar greater than 100 will prompt an A1c check.

## 2010-12-12 NOTE — Progress Notes (Signed)
  Subjective:    Patient ID: Amber Branch, female    DOB: 1980-06-26, 30 y.o.   MRN: [id number]  HPI Visit conducted in Spanish.  Feels better than she did before the IUD was removed.  Had a 3-day menstrual bleed which started the day after IUD removal.   Feels stressed by economic pressures on husband's job; he is a Surveyor, minerals and has trouble getting reliable help.  His workers frequently get drunk and then don't show up for work.  He does not drink.  She would like to register him [facility]) with me here.  She does not feel that she is depressed; her PHQ9 is 5 today (none for Q#9; 3 for #4 (low energy) and 1 each for #3 and #5 (sleep, appetite).  Sister in law had thyroid checked, Teondra wouuld like hers checked as well.  No family history of thyroid disease in blood relatives; father's family all have obesity issues, mother has DM.   Review of Systems  Trouble with low energy; perceives increased weight not borne out by reviewing flow chart; anxious which appears situational.  No diarrhea or constipation; reports some generalized hair loss. Less abdominal bloating than before IUD was removed.  Reports good marital relationship with husband, no concerns about home safety.      Objective:   Physical Exam Well appearing, upbeat.  No apparent distress.  HEENT Neck supple. No thyroid nodules; question of mild acanthosis nigricans along neck line and axillae.  No cervical adenopathy. COR RRR, no extra sounds PULM Clear bilaterally, no rales or wheezes.  LEs; No edema noted; palpable dp pulses bilaterally.       Assessment & Plan:

## 2010-12-12 NOTE — Assessment & Plan Note (Signed)
Doing well on Sprintec. No concerns.  To continue.

## 2010-12-12 NOTE — Progress Notes (Signed)
Addended by: Farrell Ours on: 12/12/2010 04:51 PM   Modules accepted: Orders

## 2010-12-12 NOTE — Patient Instructions (Signed)
Fue un Research officer, trade union.  Estamos chequandole la tiroide, la glucosa y el perfil metabolico y colesterol en [place].  Quiero volver a verle en 4 a 6 semanas; le llamo al [phone] la semana que viene con los resultados y ahi vemos si hay que hacer alguna intervencion sobre ellos.  MAKE FOLLOW UP APPT WITH DR Mauricio Po IN 4 TO 6 WEEKS.

## 2010-12-17 ENCOUNTER — Telehealth: Payer: Self-pay | Admitting: Family Medicine

## 2010-12-17 NOTE — Telephone Encounter (Signed)
Called patient to discuss labs, call completed in Spanish.  Discussed normal glucose (83), normal TSH, elevated TGs.  Discussed dietary changes to reduce starches and sugars, increase fiber.  Initiate physical activity on regular basis to help with her tiredness.  Keep her usual regulated sleep hygiene.  She is to keep upcoming appointment with me to discuss; consider nutrition consult or health coach with interpreter. Amber Branch O

## 2010-12-23 LAB — GLUCOSE, CAPILLARY
Glucose-Capillary: 139 — ABNORMAL HIGH
Glucose-Capillary: 96

## 2010-12-26 LAB — CBC
HCT: 36
MCV: 87.1
Platelets: 235
RBC: 4.13
RDW: 13.8
WBC: 6.3

## 2010-12-26 LAB — GC/CHLAMYDIA PROBE AMP, GENITAL
Chlamydia, DNA Probe: NEGATIVE
GC Probe Amp, Genital: NEGATIVE

## 2010-12-26 LAB — URINALYSIS, ROUTINE W REFLEX MICROSCOPIC
Bilirubin Urine: NEGATIVE
Ketones, ur: NEGATIVE
Nitrite: NEGATIVE
Specific Gravity, Urine: 1.015
Urobilinogen, UA: 1

## 2010-12-26 LAB — WET PREP, GENITAL
Clue Cells Wet Prep HPF POC: NONE SEEN
Trich, Wet Prep: NONE SEEN

## 2010-12-26 LAB — URINE MICROSCOPIC-ADD ON

## 2011-01-09 ENCOUNTER — Encounter: Payer: Self-pay | Admitting: Family Medicine

## 2011-01-09 ENCOUNTER — Ambulatory Visit (INDEPENDENT_AMBULATORY_CARE_PROVIDER_SITE_OTHER): Payer: Self-pay | Admitting: Family Medicine

## 2011-01-09 VITALS — BP 111/78 | HR 72 | Ht 60.0 in | Wt 180.0 lb

## 2011-01-09 DIAGNOSIS — E781 Pure hyperglyceridemia: Secondary | ICD-10-CM | POA: Insufficient documentation

## 2011-01-09 DIAGNOSIS — R51 Headache: Secondary | ICD-10-CM

## 2011-01-09 LAB — POCT GLYCOSYLATED HEMOGLOBIN (HGB A1C): Hemoglobin A1C: 5.3

## 2011-01-09 MED ORDER — NORTRIPTYLINE HCL 25 MG PO CAPS: 25.0000 mg | ORAL_CAPSULE | Freq: Every day | ORAL | Status: DC

## 2011-01-09 NOTE — Assessment & Plan Note (Signed)
Discussed diet again today; will refer to Health coach for additional support on lifestyle measures to control weight and TGs, increase energy.

## 2011-01-09 NOTE — Progress Notes (Signed)
  Subjective:    Patient ID: Amber Branch, female    DOB: July 02, 1980, 30 y.o.   MRN: [id number]  HPI Visit conducted in Spanish. Amber Branch is here for follow up of her loss of energy, feeling tired all the time, also with nightly headaches in the evenings.  The HAs are worse when she eats crackers or other starchy foods.  At last visit she had lab workup for possible organic causes; her TSH and fasting glucose are within normal limits. Her TGs are quite high, almost double the normal level.    Additionally, PHQ9 done at last visit was a 5. She does not feel depressed.   States taht she goes to sleep at 930pm; sleeps through the night until 6am, when she gets her children ready for school.  Goes back to sleep from 715 to 9am.  Then awake the rest of the day, usually cleaning and housework.  Does not have dedicated physical activity other than chores.    Review of Systems Does not snore.  Feels tired in the mornings.  Generalized headaches that do not awaken her from sleep, no nausea/vomiting or motor changes with HA.    Objective:   Physical Exam Well appearing, no apparent distress.        Assessment & Plan:

## 2011-01-09 NOTE — Patient Instructions (Signed)
Fue un Research officer, trade union.    Para prevencion de los dolores de [place], estoy recetandole Pena para tomar todas las noches una hora antes de [place].  Tomesela TODAS LAS NOCHES, una hora antes de la hora de dormir.  QUiero que [employer] agenda con los dias/noches en que tiene dolor de [place].   Estoy chequeando otro estudio para descartar por completo la posibilidad de la diabetes.   Como [employer], tiene los [employer].  Eso puede tener mucho que ver con la alimentacion y el ejercicio.  Quiero que limite el consumo de comidas con harinas y [place], como el arroz blanco, la tortilla, el pan y las sodas.  Quiero ponerle en contacto con una "entrenadora de la salud" ("Health Coach"), Arlys John.  Vamos a tratar de Research scientist (medical) cita con ella y con la interprete para ver cuales otras maneras le podemos ayudar a tener una vida mas activa.  PLEASE MAKE APPT WITH HEALTH COACH SUZANNE LINEBERRY AND SCHEDULE SPANISH-LANGUAGE INTERPETER, TO HELP WITH INCREASING PHYSICAL ACTIVITY FOR WEIGHT LOSS, DIETARY CHANGES FOR HYPERTRIGLYCERIDEMIA.  FOLLOW UP WITH DR Mauricio Po IN 6 WEEKS.

## 2011-01-09 NOTE — Assessment & Plan Note (Signed)
Likely associated with stress, possibly poor sleep quality. No snoring, consider OSA as possible cause.  Patient to start on pamelor nightly for sleep and headache prophylaxis.

## 2011-01-15 ENCOUNTER — Ambulatory Visit: Payer: Self-pay | Admitting: Home Health Services

## 2011-01-23 ENCOUNTER — Ambulatory Visit (INDEPENDENT_AMBULATORY_CARE_PROVIDER_SITE_OTHER): Payer: Self-pay | Admitting: Family Medicine

## 2011-01-23 ENCOUNTER — Encounter: Payer: Self-pay | Admitting: Family Medicine

## 2011-01-23 VITALS — BP 112/74 | HR 71 | Temp 97.9°F | Ht 59.0 in | Wt 177.5 lb

## 2011-01-23 DIAGNOSIS — R51 Headache: Secondary | ICD-10-CM

## 2011-01-23 DIAGNOSIS — E669 Obesity, unspecified: Secondary | ICD-10-CM

## 2011-01-23 NOTE — Progress Notes (Signed)
  Subjective:    Patient ID: Amber Branch, female    DOB: Aug 19, 1980, 30 y.o.   MRN: [id number]  HPI  OBESITY  Current weight/BMI :  177.5 lbs  How long have been obese:  3+ years Course:  Slight improvement Problems or symptoms it causes:  hyperglycemia   Things have tried to improve:  Herbal life, bariatric clinic, diet changes  Patient Identified Concern:  Weight loss, cholesterol Stage of Change Patient Is In:  Contemplation (patient planning on making changes within next 6 months) Patient Reported Barriers:  Habit, stress, social influences, sweets taste good Patient Reported Perceived Benefits:  Health, pregnancy, weight loss, healthy example to children Patient Reports Self-Efficacy:   Pt demonstrates some self-efficacy and a hx of weight loss success Behavior Change Supports:  Husband is willing to make diet changes in the home with her.  Goals:  Keep 3 day food journal, limit soda, increase daily water, start limiting daily intake of sweets.  Patient Education:  We discussed similarities between sweet tea and soda, food journals, importance of water, and limiting sweets.    HEADACHES Discussed with the patient in Spanish. She reports that she has had improvement in her sleep disturbances and her headaches while taking Pamelor. She had noted online that Pamelor is used as an antidepressant, and then she stopped taking it. She has not taken in the past 3-4 nights and has been having difficulty with sleep. She reports no adverse effects from the medication.    Review of Systems     Objective:   Physical Exam  No acute distress.       Assessment & Plan:

## 2011-01-23 NOTE — Patient Instructions (Addendum)
Por favor empieze de nuevo la medicina,mantenga por favor las anotaciones de cuando tiene los dolores de [place] y  de [place] duerme.  Recuerde sustituir los dulce por frutas en especial como lo discutimos hoy, despues que su esposo regrese del trabajo y [employer] las anotaciones de sus comidas po tres dias y Scientist, forensic la proxima semana para Radio producer seguimiento y saber como esta todo.  Dr. Gregary Cromer verla en un mes.

## 2011-01-23 NOTE — Assessment & Plan Note (Signed)
Plan is to resume Pamelor 25 mg nightly at bedtime for prophylaxis of headaches as well as improved sleep. I have asked her to keep a headache and sleep log for the next one month and to see me in followup at that time. She expresses agreement with this plan and will call ahead of time if there are any other new problems.

## 2011-01-23 NOTE — Assessment & Plan Note (Signed)
Assessment: Obesity Control: slight improvement Progress toward goals: Pt is willing to work on weight loss.  Pt is considering a future pregnancy.  Currently, the pt is on a herbal life diet where she is on meal replacement drinks 3 times a day.  Pt also eats cookies and sweets throughout the day. Barriers to meeting goals: Habit, some dietary eduction, stress, social influences.  Plan: Obesity treatment: Pt will keep food journal for 3 days and follow up with health coach in 1 week on telephone.  Pt also set goal to replace cookies with fruit for her afternoon snack.  Pt will continue to only drink soda 2 x a week and focus on drinking more water throughout the day.  Continue counseling with health educator.

## 2011-02-10 ENCOUNTER — Telehealth: Payer: Self-pay | Admitting: Clinical

## 2011-02-10 ENCOUNTER — Telehealth: Payer: Self-pay | Admitting: Home Health Services

## 2011-02-10 NOTE — Telephone Encounter (Signed)
Clinical Social Worker is currently with Staff Rosalita Chessman to assist pt. With wellness. Clinical Social Worker contacted pt. To discuss how her overall health and social life has been going. Pt. Reported sleeping 8 hours a day and taking her sleeping medication as prescribe. Pt. Reports eliminating soda intake and  feeling better and more energized since doing so. Pt. Did state that she has not completely eliminated caffeine from her children however has decreased their intake amount. Clinical Social Worker provided education in the risks associated with children drinking caffeine such as dependence, cavities, lack of concentration in school, and several others. Pt. Was receptive and stated she didn't realize the risks and will keep that in mind. Pt. Also stated that she is not on any birth control as her and her husband are currently trying to have another child. Pt. Stated she currently only experiences minimal stress when working on the finances/budget but denies any other stressors. Clinical Social Worker informed pt. That she is available if she has any other needs. Theresia Bough, MSW, Theresia Majors [phone]

## 2011-02-10 NOTE — Telephone Encounter (Signed)
Pt is not drinking more sodas and eating less carbohydrate pt feels well, pt is eating more frutes and vegetables, Pt has just one meal at noon time and herbalife shakes. Pt is taking medic and pt is sleeping good.  [place]    

## 2011-02-17 ENCOUNTER — Ambulatory Visit: Payer: Self-pay | Admitting: Family Medicine

## 2011-02-19 ENCOUNTER — Telehealth: Payer: Self-pay | Admitting: Clinical

## 2011-02-19 NOTE — Telephone Encounter (Signed)
Clinical Child psychotherapist (CSW) spoke with pt. Amber Branch over the phone.  -Pt stated she is doing very well and has continued on her long term goal of removing soda and bread from her diet. -Pt did not report having any more issues with sleep and denies feeling stressed. -Pt stated she did not complete her weekly goal of re framing from tortillas as she stated it is very difficult because her family eats them every day.  -Pt stated she would like to continue on staying focused on her long term goal this week and prefers not to add a new weekly goal as she stated she tends to make too many goals and accomplish few.   Theresia Bough, MSW, Theresia Majors [phone]

## 2011-02-26 ENCOUNTER — Telehealth: Payer: Self-pay | Admitting: Clinical

## 2011-02-26 NOTE — Telephone Encounter (Signed)
Clinical Child psychotherapist (CSW) contacted pt over the phone.  -Pt reports sleeping approximately 8 hours a day and having no issues with sleep. -Pt states that she found out she is two weeks pregnant and has therefore decided to discontinue her medication for sleep. CSW informed pt she would inform MD. -Pt denies any feelings of stress and reports feeling "good." -Pt continues taking herbal life shakes however understands that this may not be the best diet for her especially considering that she is now pregnant. -Pt continues on goal: Decreasing soda and bread from her diet and desires to continue on this goal.  Pt next scheduled appointment is March 04, 2011.   Theresia Bough, MSW, Theresia Majors [phone]

## 2011-03-04 ENCOUNTER — Ambulatory Visit (INDEPENDENT_AMBULATORY_CARE_PROVIDER_SITE_OTHER): Payer: Self-pay | Admitting: Family Medicine

## 2011-03-04 ENCOUNTER — Encounter: Payer: Self-pay | Admitting: Family Medicine

## 2011-03-04 VITALS — BP 103/69 | HR 65 | Temp 98.3°F | Ht 59.0 in | Wt 180.0 lb

## 2011-03-04 DIAGNOSIS — R5383 Other fatigue: Secondary | ICD-10-CM

## 2011-03-04 DIAGNOSIS — Z3201 Encounter for pregnancy test, result positive: Secondary | ICD-10-CM | POA: Insufficient documentation

## 2011-03-04 DIAGNOSIS — R5381 Other malaise: Secondary | ICD-10-CM

## 2011-03-04 DIAGNOSIS — Z309 Encounter for contraceptive management, unspecified: Secondary | ICD-10-CM

## 2011-03-04 MED ORDER — GNP PRENATAL VITAMINS 28-0.8 MG PO TABS: 1.0000 | ORAL_TABLET | Freq: Every day | ORAL | Status: DC

## 2011-03-04 NOTE — Patient Instructions (Addendum)
Felicidades!  La prueba de embarazo esta' positiva.  Por la ultima menstruacion (Oct 24), esta' con 7 semanas de embarazo hoy.   Vaya a Adopt A Mom para ser encaminada a nostros para los laboratorios.  Me alegro que esta' considerando nuestras visitas de embarazo en grupo!  PRENATAL LAB APPT, NOB VISIT WITH CAVINESS OR MCGILL

## 2011-03-04 NOTE — Progress Notes (Signed)
  Subjective:    Patient ID: Amber Branch, female    DOB: [birth year], 30 y.o.   MRN: [id number]  HPI Visit conducted in Spanish.   Amber Branch reports that she is feeling well; not feeling depressed. Sleep is much better, continues to take the medication (Pamelor).   She stopped the OCPs about 2 months ago, hoping for pregnancy.  LMP Jan 14, 2011.  Checked home pregnancy test 1 week ago, was positive.  Comes today for confirmation.   Prior pregnancy (daugther Amber Branch) with Adopt-A-Mom support, prenatal care in our office. Would like to be in our practice again for this pregnancy.   Review of Systems Some nausea without vomiting; some breast tenderness.      Objective:   Physical Exam Well appearing, no apparent distress       Assessment & Plan:

## 2011-03-04 NOTE — Assessment & Plan Note (Signed)
Resolved with Nortriptyline.  Just confirmed her to be pregnant about 7 weeks by LMP.  WIll hold the Nortriptyline (cat C), may use benadryl at bedtime for sleep at least through first trimester.

## 2011-03-12 ENCOUNTER — Telehealth: Payer: Self-pay | Admitting: Clinical

## 2011-03-12 NOTE — Telephone Encounter (Signed)
Clinical Child psychotherapist (CSW) attempted to contact pt to provide one last follow up as pt will be receiving services/assistances from [facility]. CSW unable to get a hold of pt, CSW left a message.  Theresia Bough, MSW, Theresia Majors [phone]

## 2011-03-18 ENCOUNTER — Telehealth (HOSPITAL_COMMUNITY): Payer: Self-pay | Admitting: Family Medicine

## 2011-03-18 NOTE — Telephone Encounter (Signed)
Pt called and stated is having brown discharge. After I spoke with triage nurse, she recommended to pt  go to [facility]. Pt was ok with it and Pt  will make future appt with Dr. Mauricio Po. Amber Branch

## 2011-03-21 ENCOUNTER — Inpatient Hospital Stay (HOSPITAL_COMMUNITY): Payer: Self-pay

## 2011-03-21 ENCOUNTER — Encounter (HOSPITAL_COMMUNITY): Payer: Self-pay | Admitting: *Deleted

## 2011-03-21 ENCOUNTER — Inpatient Hospital Stay (HOSPITAL_COMMUNITY)
Admission: AD | Admit: 2011-03-21 | Discharge: 2011-03-21 | Disposition: A | Discharge status: Home / Self Care | Payer: Self-pay | Source: Ambulatory Visit | Attending: Obstetrics & Gynecology | Admitting: Obstetrics & Gynecology

## 2011-03-21 DIAGNOSIS — O209 Hemorrhage in early pregnancy, unspecified: Secondary | ICD-10-CM | POA: Insufficient documentation

## 2011-03-21 LAB — HCG, QUANTITATIVE, PREGNANCY: hCG, Beta Chain, Quant, S: 3667 m[IU]/mL — ABNORMAL HIGH (ref <5)

## 2011-03-21 LAB — CBC
HCT: 36.1 % (ref 36.0–46.0)
MCHC: 33.5 g/dL (ref 30.0–36.0)
Platelets: 163 10*3/uL (ref 150–400)
RDW: 13.5 % (ref 11.5–15.5)
WBC: 4.3 10*3/uL (ref 4.0–10.5)

## 2011-03-21 LAB — WET PREP, GENITAL
Clue Cells Wet Prep HPF POC: NONE SEEN
Trich, Wet Prep: NONE SEEN

## 2011-03-21 MED ORDER — ACETAMINOPHEN 500 MG PO TABS
1000.0000 mg | ORAL_TABLET | Freq: Once | ORAL | Status: AC
  Administered 2011-03-21: 1000 mg via ORAL
  Filled 2011-03-21: qty 2

## 2011-03-21 NOTE — Progress Notes (Signed)
Patient with c/o headache, has not taken any medications for it. (she will like something for headache). She also states that she noticed brown to red spotting after wiping. She does not have a pad on. She denies any pain or cramping. [facility].

## 2011-03-21 NOTE — Progress Notes (Signed)
Pt c/o headache x2 days. Reports havingg brownish disharge x 1 week that has turned into bright red bleeding when wiping. Denies abd pain or cramping.

## 2011-03-21 NOTE — ED Provider Notes (Signed)
History     No chief complaint on file.  HPI  Pt c/o headache x2 days. Reports havingg brownish disharge x 1 week that has turned into bright red bleeding when wiping. Denies abd pain or cramping.   History reviewed. No pertinent past medical history.  History reviewed. No pertinent past surgical history.  History reviewed. No pertinent family history.  History  Substance Use Topics  . Smoking status: Never Smoker   . Smokeless tobacco: Not on file  . Alcohol Use: No    Allergies: No Known Allergies  Prescriptions prior to admission  Medication Sig Dispense Refill  . Prenatal Vit-Fe Fumarate-FA (PRENATAL MULTIVITAMIN) TABS Take 1 tablet by mouth daily.          Review of Systems  Eyes: Negative.   Respiratory: Negative.   Cardiovascular: Negative.   Gastrointestinal: Negative.   Genitourinary: Negative.        Vaginal bleeding, small clots  Neurological: Positive for headaches.   Physical Exam   Blood pressure 128/62, pulse 118, temperature 100.3 F (37.9 C), temperature source Oral, resp. rate 18, height 4\' 11"  (1.499 m), weight 81.738 kg (180 lb 3.2 oz), last menstrual period 01/15/2011.  Physical Exam  Constitutional: She is oriented to person, place, and time. She appears well-developed and well-nourished.       Uncomfortable appearing  HENT:  Head: Normocephalic.  Eyes: Pupils are equal, round, and reactive to light.  Neck: Normal range of motion. Neck supple.  Cardiovascular: Normal rate, regular rhythm and normal heart sounds.  Exam reveals no gallop and no friction rub.   No murmur heard. Respiratory: Effort normal and breath sounds normal. No respiratory distress.  GI: Soft. She exhibits no mass. There is tenderness (suprapubic). There is no rebound and no guarding.  Genitourinary: There is bleeding (moderate; +clots) around the vagina.  Neurological: She is alert and oriented to person, place, and time.  Skin: Skin is warm and dry.    MAU Course    Procedures  [place]: A single intrauterine gestational sac is visualized. The fetal  pole and yolk sac are observed. No fetal cardiac activity is  demonstrated. This could be due to early gestational age by early  changes of fetal demise are not excluded. Recommend follow-up with  serial quantitative beta HCG levels and short term follow-up  ultrasound for further evaluation is clinically indicated.  Results for orders placed during the hospital encounter of 03/21/11 (from the past 24 hour(s))  WET PREP, GENITAL     Status: Abnormal   Collection Time   03/21/11  7:20 PM      Component Value Range   Yeast, Wet Prep NONE SEEN  NONE SEEN    Trich, Wet Prep NONE SEEN  NONE SEEN    Clue Cells, Wet Prep NONE SEEN  NONE SEEN    WBC, Wet Prep HPF POC RARE (*) NONE SEEN   CBC     Status: Normal   Collection Time   03/21/11  8:55 PM      Component Value Range   WBC 4.3  4.0 - 10.5 (K/uL)   RBC 4.00  3.87 - 5.11 (MIL/uL)   Hemoglobin 12.1  12.0 - 15.0 (g/dL)   HCT 40.9  81.1 - 91.4 (%)   MCV 90.3  78.0 - 100.0 (fL)   MCH 30.3  26.0 - 34.0 (pg)   MCHC 33.5  30.0 - 36.0 (g/dL)   RDW 78.2  95.6 - 21.3 (%)   Platelets 163  150 - 400 (K/uL)  HCG, QUANTITATIVE, PREGNANCY     Status: Abnormal   Collection Time   03/21/11  8:55 PM      Component Value Range   hCG, Beta Chain, Quant, S 3667 (*) <5 (mIU/mL)     Assessment and Plan  Bleeding during Pregnancy  Plan: Return in 48 hours for BHCG Bleeding Precautions Explained highly probable that it is an IUFD  [employer] 03/21/2011, 8:47 PM

## 2011-03-23 ENCOUNTER — Other Ambulatory Visit: Payer: Self-pay | Admitting: Obstetrics and Gynecology

## 2011-03-23 ENCOUNTER — Inpatient Hospital Stay (HOSPITAL_COMMUNITY): Admit: 2011-03-23 | Payer: Self-pay

## 2011-03-23 ENCOUNTER — Inpatient Hospital Stay (HOSPITAL_COMMUNITY)
Admission: AD | Admit: 2011-03-23 | Discharge: 2011-03-23 | Disposition: A | Discharge status: Home / Self Care | Payer: Self-pay | Source: Ambulatory Visit | Attending: Obstetrics and Gynecology | Admitting: Obstetrics and Gynecology

## 2011-03-23 ENCOUNTER — Encounter (HOSPITAL_COMMUNITY): Payer: Self-pay

## 2011-03-23 DIAGNOSIS — O039 Complete or unspecified spontaneous abortion without complication: Secondary | ICD-10-CM | POA: Insufficient documentation

## 2011-03-23 LAB — HCG, QUANTITATIVE, PREGNANCY: hCG, Beta Chain, Quant, S: 2361 m[IU]/mL — ABNORMAL HIGH (ref <5)

## 2011-03-23 LAB — GC/CHLAMYDIA PROBE AMP, GENITAL
Chlamydia, DNA Probe: NEGATIVE
GC Probe Amp, Genital: NEGATIVE

## 2011-03-23 MED ORDER — IBUPROFEN 800 MG PO TABS
800.0000 mg | ORAL_TABLET | Freq: Once | ORAL | Status: AC
  Administered 2011-03-23: 800 mg via ORAL
  Filled 2011-03-23: qty 1

## 2011-03-23 MED ORDER — ACETAMINOPHEN-CODEINE #3 300-30 MG PO TABS: 1.0000 | ORAL_TABLET | Freq: Four times a day (QID) | ORAL | Status: DC | PRN

## 2011-03-23 MED ORDER — KETOROLAC TROMETHAMINE 60 MG/2ML IM SOLN: 60.0000 mg | Freq: Once | INTRAMUSCULAR | Status: DC

## 2011-03-23 NOTE — Progress Notes (Signed)
Midlevel in discussing miscarriage with pt and husb through interpreteur

## 2011-03-23 NOTE — Progress Notes (Signed)
[facility] Department of Clinical Social Work Documentation of Interpretation   I assisted __Sara RN_________________ with interpretation of _____questions_________________ for this patient.

## 2011-03-26 ENCOUNTER — Ambulatory Visit (INDEPENDENT_AMBULATORY_CARE_PROVIDER_SITE_OTHER): Payer: Self-pay | Admitting: Family Medicine

## 2011-03-26 VITALS — BP 104/69 | HR 90 | Temp 98.3°F | Ht 59.0 in | Wt 180.0 lb

## 2011-03-26 DIAGNOSIS — O039 Complete or unspecified spontaneous abortion without complication: Secondary | ICD-10-CM

## 2011-03-26 DIAGNOSIS — R21 Rash and other nonspecific skin eruption: Secondary | ICD-10-CM

## 2011-03-26 MED ORDER — DIPHENHYDRAMINE HCL 50 MG PO CAPS
50.0000 mg | ORAL_CAPSULE | Freq: Once | ORAL | Status: AC
  Administered 2011-03-26: 50 mg via ORAL

## 2011-03-26 NOTE — Patient Instructions (Signed)
It was nice to meet you today You are likely allergic to codeine.  I would like for you to stop the medicine that your were prescribed for pain Take benadryl every 6 hours as needed once you get home, we are giving you one dose here so you will not need another dose for 6 hours If the rash becomes worse please call us back.  If you have difficulty breathing please call EMS If you have any questions call our office.  Fue un Research officer, trade union. Usted es alergica a la codeina. Me gustaria que usted parara de tomar la medicina preescrita para Chief Technology Officer. Tome Benadril cada 6 horas tanto como lo necesite en casa. Nosotros le estamos dando una dosis aqui usted necesitara una en 6 horas. Si su erupcion en la piel se empeora por favor llamenos de regreso. Si tiene dificultad respirando por favor llame EMS ([phone]) Si tiene alguna pregunta llame a la oficina

## 2011-03-26 NOTE — Progress Notes (Signed)
  Subjective:    Patient ID: Amber Branch, female    DOB: 14-Jun-1980, 31 y.o.   MRN: [id number]  HPI 1. Rash: Given Tylenol #3 for pain after having miscarriage on Monday.  Tuesday she started noticing redness on her arms and legs.  Areas with itching and occasional burning.  Reports no fevers since Monday.  Not taking any other medications and d/c'ed Tylenol #3 yesterday.  Still with itching today but seems to be improving some.  2. Miscarriage:  Over the weekend had fever, headache and mild cramping.  Started having vaginal bleeding on Monday.  At that time went to [facility] and found to have no Fetal heart activity.  Diagnosed with miscarriage at that time, she is supposed to follow up on 03/29/10 for follow up HCG. Very sad about miscarriage as she wanted to be pregnant at this time.     Review of Systems     Objective:   Physical Exam Generally: NAD Pulm: CTAB, without wheezing Skin: Diffuse erythematous rash on all extremities. Some excoriation on arms.  Does not appear cellulitic in nature.          Assessment & Plan:

## 2011-03-26 NOTE — Assessment & Plan Note (Signed)
Appears to be handling well, although she does report being very sad.  [facility] to be sure this is trending down.  Will forward to Dr. Mauricio Po as Lorain Childes

## 2011-04-02 ENCOUNTER — Ambulatory Visit (INDEPENDENT_AMBULATORY_CARE_PROVIDER_SITE_OTHER): Payer: Self-pay | Admitting: Obstetrics & Gynecology

## 2011-04-02 ENCOUNTER — Encounter: Payer: Self-pay | Admitting: Obstetrics & Gynecology

## 2011-04-02 VITALS — BP 110/68 | HR 73 | Temp 97.5°F | Ht 60.0 in | Wt 177.8 lb

## 2011-04-02 DIAGNOSIS — O039 Complete or unspecified spontaneous abortion without complication: Secondary | ICD-10-CM

## 2011-04-02 DIAGNOSIS — N96 Recurrent pregnancy loss: Secondary | ICD-10-CM

## 2011-04-02 NOTE — Patient Instructions (Signed)
Cuidados preventivos en las mujeres adultas  (Preventative Care for Adults, Female) Un estilo de vida saludable y los cuidados preventivos pueden favorecer la salud y el bienestar. Las guas para conservar la salud para las mujeres incluyen las siguientes prcticas clave:   Un examen fsico de rutina anual es un buen modo de controlar su salud y realizar estudios preventivos. Le da la posibilidad de compartir preocupaciones y conocer el estado de su salud, y que le realicen estudios completos.   Consulte al dentista para realizar un examen de rutina y cuidados preventivos cada 6 meses. Cepllese los dientes al menos dos veces por da y psese el hilo dental al menos una vez por da. Una buena higiene bucal evita caries y enfermedades de las encas.   La frecuencia con que debe hacerse exmenes de la vista depende de la edad, el estado de salud, la historia familiar, el uso de lentes de contacto y otros factores. Siga las recomendaciones del mdico para saber con qu frecuencia debe hacerse exmenes de la vista.   Consuma una dieta saludable. Los alimentos como vegetales, frutas, granos enteros, productos lcteos descremados y protenas magras contienen los nutrientes que usted necesita sin necesidad de consumir muchas caloras. Disminuya el consume de alimentos con alto contenido de grasas slidas, azcar y sal agregadas. Consuma la cantidad de caloras adecuada para usted.Si es necesario, pdale una dieta adecuada al profesional que lo asiste.   La actividad fsica regular es una de las cosas ms importantes que puede hacer por su salud. Los adultos deben hacer al menos 150 minutos de ejercicios de actividad de intensidad moderada (toda actividad que aumente la frecuencia cardaca y lo haga transpirar) cada semana. Adems, la mayora de los adultos necesita ejercicios de fortalecimiento muscular 2  ms das por semana.   Hay que mantener un peso saludable. El ndice de masa corporal (IMC) es una  herramienta que identifica posibles problemas con el peso. Proporciona una estimacin de la grasa corporal basndose en el peso y la altura. El mdico podr determinar si IMC y podr ayudarlo a lograr o mantener un peso saludable.Para los adultos de 20 aos o ms:   Un IMC menor a 18,5 se considera bajo peso.   Un IMC entre 18,5 y 24,9 es normal.   Un IMC entre 25 y 29,9 es sobrepeso.   Un IMC entre 30 o ms es obesidad.   Mantenga un nivel normal de lpidos y colesterol en sangre practicando actividad fsica y minimizando la ingesta de grasas saturadas. Consuma una dieta balanceada y saludable, e incluya variedad de frutas y vegetales. Los anlisis de lpidos y colesterol en sangre deben comenzar a los 20 aos y repetirse cada 5 aos. Si los niveles de colesterol son altos, tiene ms de 50 aos o tiene riesgo elevado de sufrir enfermedades cardacas necesitar controlarse con ms frecuencia.Si tiene niveles elevados de lpidos y colesterol, debe recibir tratamiento con medicamentos, si la dieta y el ejercicio no son efectivos.   Si fuma, consulte con el profesional acerca de las opciones para dejar de hacerlo. Si no lo hace, no comience.   Si est embarazada no beba alcohol. Si est amamantando, beba alcohol con prudencia. Si elige beber alcohol, no se exceda de 1 medida por da. Se considera una medida a 12 onzas (355 ml) de cerveza, 5 onzas (148 ml) de vino, o 1,5 onzas (44 ml) de licor.   Evite el alcohol y el consumo de drogas. No comparta agujas. Pida ayuda si necesita   asistencia o instrucciones con respecto a abandonar el consumo de alcohol, cigarrillos o drogas.   La hipertensin arterial causa enfermedades cardacas y aumenta el riesgo de ictus. Debe controlar su presin arterial al menos cada 1 o 2 aos. La presin arterial elevada que persiste debe tratarse con medicamentos si la prdida de peso y el ejercicio no son efectivos.   Si tiene entre 55 y 79 aos, consulte a su mdico si  debe tomar aspirina para prevenir enfermedades cardacas.   Los anlisis para la diabetes incluyen la toma de una muestra de sangre para controlar el nivel de azcar en la sangre durante el ayuno. Debe hacerlo cada 3 aos despus de los 45 aos si est dentro de su peso normal y sin factores de riesgo para la diabetes. Las pruebas deben comenzar a edades tempranas o llevarse a cabo con ms frecuencia si tiene sobrepeso y al menos 1 factor de riesgo para la diabetes.   Las evaluaciones para detectar el cncer de mama son un mtodo preventivo fundamental para las mujeres. Debe practicar la "autoconciencia de las mamas". Esto significa que debe comprender como es la apariencia normal y como se sienten sus mamas e incluir un autoexamen. Si detecta algn cambio, no importa cun pequeo sea, debe informarlo a su mdico. Las mujeres entre 20 y 30 aos deben hacer un examen clnico de las mamas como parte del examen regular de salud, cada 1 a 3 aos. Despus de los 40 aos deben hacerlo todos los aos. Deben hacerse una mamografa cada ao, comenzando a los 40 aos. Las mujeres con historia familiar de cncer de mama deben hablar con el mdico para hacer un estudio gentico. Las que tienen ms riesgo deben hacerse una ecografa y una mamografa todos los aos.   Un papanicolau se realiza para diagnosticar cncer de cuello de tero. Muestra los cambios celulares en el cuello que pueden transformarse en cncer si no se tratan. El papanicolau es un procedimiento por el que se obtienen clulas de la parte inferior del tero (cuello) y son examinadas.   Las mujeres deben hacerse un papanicolau a partir de los 21 aos.   Entre los 21 y los 29 aos debe repetirse cada dos aos.   Luego de los 30 aos, debe realizarse un Papanicolaou cada tres aos siempre que los 3 estudios anteriores sean normales.   Algunas mujeres sufren problemas mdicos que aumenta la probabilidad de contraer cncer cervical. Consulte a su  mdico acerca de estos problemas. Es muy importante que le informe a su mdico si aparecen nuevos problemas poco despus de su ltimo Papanicolaou. En estos casos, el mdico podr indicar que se realice el Papanicolaou con ms frecuencia.   Estas recomendaciones son las mismas para todas las mujeres hayan recibido o no la vacuna para el VPH (virus del papiloma humano).   Si le han realizado una histerectoma por un problema que no era cncer u otra enfermedad que podra causar cncer, ya no necesitar un Papanicolaou. Sin embargo, si ya no necesita hacerse un Papanicolau, es una buena idea hacerse un examen regularmente para asegurarse de que no hay otros problemas.   Si tiene entre 65 y 70 aos y ha tenido un Papanicolaou normal en los ltimos 10 aos, ya no ser necesario realizarlo. Sin embargo, si ya no necesita hacerse un Papanicolau, es una buena idea hacerse un examen regularmente para asegurarse de que no hay otros problemas.   Si ha recibido un tratamiento para el cncer cervical o para   una enfermedad que podra causar cncer, necesitar realizar un Papanicolaou y controles durante al menos 20 aos de concluir el tratamiento   Si no se ha hecho el examen con regularidad, debern volver a evaluarse los factores de riesgo (como el tener un nuevo compaero sexual) para determinar si debe volver a realizarse los estudios.   La prueba del VPH es un anlisis adicional que puede usarse para detectar cncer de cuello de tero. Esta prueba busca la presencia del virus que causa los cambios en el cuello. Las clulas que se recolectan durante el papanicolau pueden usarse para el VPH. La prueba para el VPH puede usarse para evaluar a mujeres de ms de 30 aos y debe usarse en mujeres de cualquier edad cuyos resultados del papanicolau no sean claros. Despus de los 30 aos, las mujeres deben hacerse el anlisis para el VPH con la misma frecuencia que el papanicolau.   El cncer colorectal puede detectarse  y con fecuencia puede prevenirse. La mayor parte de los estudios de rutina comienzan a los 50 aos y continan hasta los 75 aos. Sin embargo, el mdico podr aconsejarle que lo haga antes, si tiene factores de riesgo para el cncer de colon. Una vez por ao, el profesional le dar un kit de prueba para hallar sangre oculta en la materia fecal. Utiliza un tubo con una pequea cmara en su extremo para examinar directamente el colon (sigmoidoscopa o colonoscopa), para detectar formas temprana de cncer colorectal. Hable con su mdico si tiene 50 aos, cuando comience con los estudios de rutina. El examen directo del colon debe repetirse cada 5 a 10 aos, hasta los 75 aos, excepto que se encuentren formas tempranas de plipos precancerosos o pequeos bultos.   Practique el sexo seguro. Use condones y evite las prcticas sexuales riesgosas para disminuir el contagio de enfermedades de transmisin sexual. Las enfermedades de transmisin sexual son la gonorrea, clamidia, sfilis, tricomonas, herpes, VPH y el virus de inmunodeficiencia humana (VIH). El herpes, el VIH y el VPH son enfermedades virales que no tienen cura. Pueden producir discapacidad, cncer y hasta la muerte. Las mujeres sexualmente activas de 25 aos o menos deben ser evaluadas para detectar clamidia. Las mujeres mayores que tengan mltiples compaeros tambin deben hacerse el anlisis para detectar clamidia. Se recomienda realizar anlisis para detectar otras enfermedades de transmisin sexual si es sexualmente activa y tiene riesgos.   La osteoporosis es una enfermedad en la que los huesos pierden los minerales y la fuerza por el avance de la edad. El resultado pueden ser fracturas graves en los huesos. El riesgo de osteoporosis puede identificarse con una prueba de densidad sea. Las mujeres de ms de 65 aos y las que tengan riesgos de sufrir fracturas u osteoporosis deben pedir consejo a su mdico. Consulte a su mdico si debe tomar un  suplemento de calcio o de vitamina D para reducir el riesgo de osteoporosis.   La menopausia se asocia a sntomas y riesgos fsicos. Se dispone de una terapia de reemplazo hormonal para disminuir los sntomas y los riesgos. Consulte a su mdico para saber si la terapia de reemplazo hormonal es conveniente para usted.   Use una pantalla solar con un factor SPF de 30 o mayor. Aplique pantalla de manera libre y repetida a lo largo del da. Pngase al resguardo del sol cuando la sombra sea ms pequea que usted. Protjase usando mangas y pantalones largos, un sombrero de ala ancha y gafas para el sol todo el ao, siempre que   se encuentre en el exterior.   Una vez por mes hgase un examen de la piel de todo el cuerpo usando un espejo para ver la espalda. nforme al mdico si aparecen nuevos lunares, los que ya estn tienen bordes irregulares, los que sean ms grandes que una goma de lpiz o los que hayan cambiado su forma o color.   Mantngase al da con las vacunas.   Gripe: Debe aplicarse una dosis todos en cada otoo (o invierno). La composicin de la vacuna de la gripe cambia todos los aos, por lo tanto no es suficiente con vacunarse una vez.   Vacuna antineumocccica de polisacridos Debe aplicarse 1  2 dosis si fuma o si sufre ciertas enfermedades crnicas. Necesitar 1 dosis a los 65 aos (o ms) si nunca se ha vacunado.   Vacuna difteria, ttanos, tos convulsa (DTP). Aplquese una dosis de la vacuna DTaP (vacuna contra la tos convulsa para adultos) si es menor de 65 aos, si tiene ms de 65 aos y est en contacto con un beb, es un trabajador de la salud, es una mujer embarazada o simplemente quiere estar protegido de la enfermedad. Luego necesitar un refuerzo de DT cada 10 aos. Consulte con su mdico si no ha recibido al menos 3 dosis de la vacuna contra el ttanos (y la difteria) en algn momento de su vida o tiene una herida profunda o sucia.   VPH: Debe aplicarse esta vacuna si tiene 26  aos o menos. La vacuna se administra en 3 dosis, generalmente durante el curso de 6 meses.   MMR (sarampin, paperas, rubola) Debe aplicarse al menos una dosis de MMR si ha nacido en 1957 o despus. Podra tambin necesitar una segunda dosis.   Antimeningocccica Si tiene entre 19 y 21 aos y es un estudiante universitario de primer ao que vive en una residencia estudiantil, o tiene alguna enfermedad mdica, debe recibir esta vacuna. Podra tambin necesitar dosis de refuerzo.   Herpes zoster (culebrilla). Si tiene 60 aos o ms debe aplicarse esta vacuna ahora.   Varicela Si nunca se vacun o slo recibi una dosis, hable con su mdico para averiguar si necesita aplicarse esta vacuna.   Hepatitis A. Debe aplicarse esta vacuna si tiene un factor de riesgo especfico para contraer una infeccin por el virus de la hepatitis A, o simplemente desea estar protegido contra la enfermedad. La vacuna se administra en 2 dosis, con una diferencia entre 6 y 18 meses.   Hepatitis B. Debe aplicarse esta vacuna si tiene un factor de riesgo especfico para contraer una infeccin por el virus de la hepatitis B, o simplemente desea estar protegido contra la enfermedad. La vacuna se administra en 3 dosis, generalmente durante el curso de 6 meses.  Controles preventivos - Frecuencia Edad 19 a 39  Control de la presin arterial** / Cada 1 a 2 aos.   Control de lpidos y colesterol. **/ Cada 5 aos, comenzando a los 20 aos   Examen clnico de mamas** /Cada 3 aos en las mujeres entre los 20 y los 30 aos.   Papanicolau** Cada 2 aos entre los 21 y los 29 aos. Despus de los 30 aos, y hasta los 65 o 70, con una historia de 3 papanicolau normales consecutivos.   Pruebas para el VPH** /Cada 3 aos, a partir de los 30 aos, y hasta los 65 o 70, con una historia de 3 papanicolau normales consecutivos.   Autoexamen de piel /todos los meses   Vacuna contra la   gripe** /Todos los aos   Vacuna antineumocccica  de polisacridos ** /Debe aplicarse 1  2 dosis si fuma o si sufre ciertas enfermedades crnicas.   Vacuna difteria, ttanos, tos convulsa (Tdap, Td). /Una dosis nica de vacuna Tdap. Luego necesitar un refuerzo de DT cada 10 aos.   Vacuna contra el VPH. /3 dosis en el curso de 6 meses, si tiene 26 aos o menos.   MMR (sarampin, paperas, rubola) Debe aplicarse al menos una dosis de MMR si ha nacido en 1957 o despus. Podra tambin necesitar una segunda dosis.   Vacunacin antimeningocccica. Si tiene entre 19 y 21 aos y es un estudiante universitario de primer ao que vive en una residencia estudiantil, o tiene alguna enfermedad mdica, debe recibir esta vacuna. Podra tambin necesitar dosis de refuerzo.   Vacuna contra la varicela **/ Consltelo con el mdico.   Vacuna contra la hepatitis A **/ Consltelo con el mdico. 2 dosis, con un intervalo entre 6 a 18 meses.   Vacuna contra la hepatitis B** / Consltelo con el mdico. 3 dosis en el curso de 6 meses.  Edad 40 a 64  Control de la presin arterial** / Cada 1 a 2 aos.   Control de lpidos y colesterol. **/ Cada 5 aos, comenzando a los 20 aos   Examen clnico de mamas** /Todos los aos despus de los 40 aos.   Mamografa** /Una vez por ao a partir de los 40 aos y continuando siempre que tenga buena salud. Consulte con el mdico.   Papanicolau** /Cada 3 aos despus de los 30 aos, y hasta los 65 o 70, con una historia de 3 papanicolau normales consecutivos.   Pruebas para el VPH** /Cada 3 aos despus de los 30 aos, y hasta los 65 o 70, con una historia de 3 papanicolau normales consecutivos.   Prueba de sangre oculta en materia fecal. /Cada ao comenzando a los 50 aos continuando hasta los 75. No tendr que hacerlo si se ha hecho una colonoscopa cada 10 aos.   Sigmoidoscopa flexible** o colonoscopa. /Cada 5 aos para la sigmoidoscopa flexible o cada 10 aos para la colonoscopa, comenzando a los 50 aos y  continuando hasta los 75 aos.   Autoexamen de piel /todos los meses   Vacuna contra la gripe** /Todos los aos   Vacuna antineumocccica de polisacridos ** /Debe aplicarse 1  2 dosis si fuma o si sufre ciertas enfermedades crnicas.   Vacuna difteria, ttanos, tos convulsa (DTP). /Una dosis nica de vacuna Tdap. Luego necesitar un refuerzo de DT cada 10 aos.   MMR (sarampin, paperas, rubola) Debe aplicarse al menos una dosis de MMR si ha nacido en 1957 o despus. Podra tambin necesitar una segunda dosis.   Vacuna contra la varicela **/ Consltelo con el mdico.   Vacunacin antimeningocccica. / Consltelo con el mdico.     Vacuna contra la hepatitis A **/ Consltelo con el mdico. 2 dosis, con un intervalo entre 6 a 18 meses.   Vacuna contra la hepatitis B** / Consltelo con el mdico. 3 dosis en el curso de 6 meses.  Edad 65 o ms  Control de la presin arterial** / Cada 1 a 2 aos.   Control de lpidos y colesterol. **/ Cada 5 aos, comenzando a los 20 aos   Examen clnico de mamas** /Todos los aos despus de los 40 aos.   Mamografa** /Una vez por ao a partir de los 40 aos y continuando siempre que tenga buena salud. Consulte con   el mdico.   Papanicolau **/Cada 3 aos despus de los 30 aos, y hasta los 65 o 70, con una historia de 3 papanicolau normales consecutivos. Las pruebas pueden detenerse entre los 65 y los 70 aos, si tiene 3 papanicolau consecutivos normales y no tuvo un papanicoalu ni prueba de VPH anormales en los ltimos 10 aos.   Pruebas para el VPH** /Cada 3 aos despus de los 30 aos, y hasta los 65 o 70, con una historia de 3 papanicolau normales consecutivos. Las pruebas pueden detenerse entre los 65 y los 70 aos, si tiene 3 papanicolau consecutivos normales y no tuvo un papanicoalu ni prueba de VPH anormales en los ltimos 10 aos.   Prueba de sangre oculta en materia fecal. /Cada ao comenzando a los 50 aos continuando hasta los 75. No  tendr que hacerlo si se ha hecho una colonoscopa cada 10 aos.   Sigmoidoscopa flexible** o colonoscopa. /Cada 5 aos para la sigmoidoscopa flexible o cada 10 aos para la colonoscopa, comenzando a los 50 aos y continuando hasta los 75 aos.   Pruebas para la osteoporosis** /Por nica vez en mujeres de ms de 65 aos que tengan riesgo de fracturas u osteoporosis.   Autoexamen de piel /todos los meses   Vacuna contra la gripe** /Todos los aos   Vacuna antineumocccica de polisacridos ** /Necesitar 1 dosis a los 65 aos (o ms) si nunca se ha vacunado.   Vacuna difteria, ttanos, tos convulsa (Tdap, Td). Aplquese una dosis de la vacuna DTaP (vacuna contra la tos convulsa para adultos) si tiene ms de 65 aos y est en contacto con un beb, es un trabajador de la salud, es una mujer embarazada o simplemente quiere estar protegido de la enfermedad. Luego necesitar un refuerzo de DT cada 10 aos.   Vacuna contra la varicela **/ Consltelo con el mdico.   Vacunacin antimeningocccica. / Consltelo con el mdico.   Vacuna contra la hepatitis A **/ Consltelo con el mdico. 2 dosis, con un intervalo entre 6 a 18 meses.   Vacuna contra la hepatitis B** /Consulte con el mdico. 3 dosis en el curso de 6 meses.  **La historia familiar y personal de riesgos y enfermedades puede cambiar las recomendaciones del mdico.. Document Released: 12/17/2004 Document Revised: 11/19/2010 [employer] Patient Information 2012 [employer]. 

## 2011-04-02 NOTE — Progress Notes (Signed)
History:  31 y.o. [id number] here today for follow up miscarriage.  Was seen in the [facility] and has declining quants, also brought in some tissue that was passed at home, pathology positive for products of conception.  No bleeding since. No intercourse yet, no other concerns.  Patient reports having a Rx for OCPs. Desires testing to figure out why she keeps having miscarriages, she has 3 SABs.  The following portions of the patient's history were reviewed and updated as appropriate: allergies, current medications, past family history, past medical history, past social history, past surgical history and problem list.   Objective:  Physical Exam Blood pressure 110/68, pulse 73, temperature 97.5 F (36.4 C), temperature source Oral, height 5' (1.524 m), weight 177 lb 12.8 oz (80.65 kg), last menstrual period 01/15/2011, unknown if currently breastfeeding. Deferred  Assessment & Plan:  Recurrent miscarriage Will check BHCG today and thrombophilia panel for evaluation of recurrent miscarriages Follow up results and manage accordingly

## 2011-04-03 LAB — CARDIOLIPIN ANTIBODIES, IGG, IGM, IGA
Anticardiolipin IgA: 16 APL U/mL (ref <22)
Anticardiolipin IgM: 29 MPL U/mL — ABNORMAL HIGH (ref <11)

## 2011-04-03 LAB — HCG, QUANTITATIVE, PREGNANCY: hCG, Beta Chain, Quant, S: <2 m[IU]/mL

## 2011-04-03 LAB — LUPUS ANTICOAGULANT PANEL: PTT Lupus Anticoagulant: 42.4 secs (ref 28.0–43.0)

## 2011-04-03 LAB — PROTEIN C, TOTAL: Protein C, Total: 155 % (ref 72–160)

## 2011-04-06 LAB — FACTOR 5 LEIDEN

## 2011-07-07 ENCOUNTER — Encounter: Payer: Self-pay | Admitting: Family Medicine

## 2011-07-07 ENCOUNTER — Ambulatory Visit (INDEPENDENT_AMBULATORY_CARE_PROVIDER_SITE_OTHER): Payer: Self-pay | Admitting: Family Medicine

## 2011-07-07 VITALS — BP 104/71 | HR 69 | Ht 60.0 in | Wt 182.0 lb

## 2011-07-07 DIAGNOSIS — N912 Amenorrhea, unspecified: Secondary | ICD-10-CM

## 2011-07-07 DIAGNOSIS — Z3201 Encounter for pregnancy test, result positive: Secondary | ICD-10-CM

## 2011-07-07 LAB — POCT URINE PREGNANCY: Preg Test, Ur: POSITIVE

## 2011-07-07 MED ORDER — PRENATAL MULTIVITAMIN CH: 1.0000 | ORAL_TABLET | Freq: Every day | ORAL | Status: DC

## 2011-07-07 NOTE — Patient Instructions (Signed)
La prueba de embarazo en la oficina salio' positiva.  Felicidades!  Por la fecha de la ultima [place], tiene hoy 10 semanas con 3 dias de [place].  La fecha anticipada de [place] 09, 2013.  Mande' otra receta a la farmacia para vitaminas antenatales.  Merian Capron carta para llevar al programa [facility].

## 2011-07-07 NOTE — Assessment & Plan Note (Signed)
Patient is now G7P3; given her prior three SABs, will enroll in [facility] and then proceed with initial OB visit, prenatal labs, and 1hGTT, then for referral to high-risk OB consult.  Explained to patient, who is happy about the pregnancy but also cautious given her recent history of recurrent SAB.  Letter for [facility] enrollment given.  With current LMP, makes her [redacted]w[redacted]d today, with EDD 01/30/12.

## 2011-07-07 NOTE — Progress Notes (Signed)
  Subjective:    Patient ID: Amber Branch, female    DOB: Aug 11, 1980, 31 y.o.   MRN: [id number]  HPI Visit conducted in Spanish.  Patient here to confirm positive pregnancy test done at home.  LMP Apr 25, 2011.  Recent miscarriage first trimester, with subsequent visit with [facility] at [facility], hypercoag workup done.  OB HX:  G7 P3: 2002: term SVD (son) 2004: term SVD (daughter Jazzmine) 2008: first trimester SAB 2010: term SVD, daughter (Mya) 2011: first trimester SAB Jan 2013: first Trimester SAB Present pregnancy   Review of Systems  Denies nausea or vomiting;  Is taking PNVs since last miscarriage.      Objective:   Physical Exam  Well appearing, no apparent distress       Assessment & Plan:

## 2011-07-11 ENCOUNTER — Inpatient Hospital Stay (HOSPITAL_COMMUNITY): Payer: Self-pay

## 2011-07-11 ENCOUNTER — Inpatient Hospital Stay (HOSPITAL_COMMUNITY)
Admission: AD | Admit: 2011-07-11 | Discharge: 2011-07-11 | Disposition: A | Discharge status: Home / Self Care | Payer: Self-pay | Source: Ambulatory Visit | Attending: Obstetrics & Gynecology | Admitting: Obstetrics & Gynecology

## 2011-07-11 ENCOUNTER — Encounter (HOSPITAL_COMMUNITY): Payer: Self-pay | Admitting: *Deleted

## 2011-07-11 DIAGNOSIS — O26899 Other specified pregnancy related conditions, unspecified trimester: Secondary | ICD-10-CM

## 2011-07-11 DIAGNOSIS — O99891 Other specified diseases and conditions complicating pregnancy: Secondary | ICD-10-CM | POA: Insufficient documentation

## 2011-07-11 DIAGNOSIS — R109 Unspecified abdominal pain: Secondary | ICD-10-CM

## 2011-07-11 DIAGNOSIS — R1032 Left lower quadrant pain: Secondary | ICD-10-CM | POA: Insufficient documentation

## 2011-07-11 LAB — CBC
HCT: 37.2 % (ref 36.0–46.0)
Hemoglobin: 12.5 g/dL (ref 12.0–15.0)
MCH: 30 pg (ref 26.0–34.0)
MCHC: 33.6 g/dL (ref 30.0–36.0)
MCV: 89.4 fL (ref 78.0–100.0)
Platelets: 215 10*3/uL (ref 150–400)
RBC: 4.16 MIL/uL (ref 3.87–5.11)
RDW: 13.4 % (ref 11.5–15.5)
WBC: 5.7 10*3/uL (ref 4.0–10.5)

## 2011-07-11 LAB — ABO/RH: ABO/RH(D): O POS

## 2011-07-11 LAB — HCG, QUANTITATIVE, PREGNANCY: hCG, Beta Chain, Quant, S: 8676 m[IU]/mL — ABNORMAL HIGH (ref <5)

## 2011-07-11 NOTE — MAU Provider Note (Signed)
History     CSN: [id number]  Arrival date & time 07/11/11  1348   None     Chief Complaint  Patient presents with  . Abdominal Cramping    (Consider location/radiation/quality/duration/timing/severity/associated sxs/prior treatment) HPI Amber Branch is a 31 y.o. [id number].Her LMP was 04/25/11 = 11 1/[redacted] wks EGA.  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 The pain is off/on, pressure.  No bleeding, change in discharge, UTI S&S or GI change.   No past medical history on file.  History reviewed. No pertinent past surgical history.  Family History  Problem Relation Age of Onset  . Anesthesia problems Neg Hx   . Hypotension Neg Hx   . Malignant hyperthermia Neg Hx   . Pseudochol deficiency Neg Hx     History  Substance Use Topics  . Smoking status: Never Smoker   . Smokeless tobacco: Never Used  . Alcohol Use: No    OB History    Grav Para Term Preterm Abortions TAB SAB Ect Mult Living   8 3 3  2  2   3       Review of Systems  Constitutional: Negative for fever and chills.  Gastrointestinal:       Abd pressure  Genitourinary: Negative for dysuria, urgency, frequency, vaginal bleeding and vaginal discharge.    Allergies  Codeine  Home Medications  No current outpatient prescriptions on file.  BP 117/67  Pulse 93  Temp(Src) 98.1 F (36.7 C) (Oral)  Resp 18  Ht 5' (1.524 m)  Wt 182 lb (82.555 kg)  BMI 35.54 kg/m2  LMP 05/05/2011  Physical Exam  Constitutional: She appears well-developed and well-nourished.  Abdominal: Soft. There is no tenderness. There is no rebound and no guarding.  Genitourinary: Vagina normal and uterus normal.       Cx closed, no bleeding  Musculoskeletal: Normal range of motion.  Neurological: She is alert.  Skin: Skin is warm and dry.  Psychiatric: She has a normal mood and affect. Her behavior is normal.    ED Course  Procedures (including critical care  time)  Labs Reviewed  POCT PREGNANCY, URINE - Abnormal; Notable for the following:    Preg Test, Ur POSITIVE (*)    All other components within normal limits  CBC  HCG, QUANTITATIVE, PREGNANCY  ABO/RH   Results for orders placed during the hospital encounter of 07/11/11 (from the past 24 hour(s))  POCT PREGNANCY, URINE     Status: Abnormal   Collection Time   07/11/11  2:42 PM      Component Value Range   Preg Test, Ur POSITIVE (*) NEGATIVE   CBC     Status: Normal   Collection Time   07/11/11  2:52 PM      Component Value Range   WBC 5.7  4.0 - 10.5 (K/uL)   RBC 4.16  3.87 - 5.11 (MIL/uL)   Hemoglobin 12.5  12.0 - 15.0 (g/dL)   HCT 14.7  82.9 - 56.2 (%)   MCV 89.4  78.0 - 100.0 (fL)   MCH 30.0  26.0 - 34.0 (pg)   MCHC 33.6  30.0 - 36.0 (g/dL)   RDW 13.0  86.5 - 78.4 (%)   Platelets 215  150 - 400 (K/uL)  HCG, QUANTITATIVE, PREGNANCY     Status: Abnormal   Collection Time   07/11/11  2:52 PM      Component Value Range  hCG, Beta Chain, [place] Longest 1610 (*) <5 (mIU/mL)  ABO/RH     Status: Normal   Collection Time   07/11/11  2:52 PM      Component Value Range   ABO/RH(D) O POS     U/S- 6 1/7 wk IUGS, no yolk sac or fetal pole.No adnexal masses, no free fluid. ASSESSMENT:  6 1/7 wk IUGS, no yolk sac No results found. PLAN:  Repeat U/S in 1 wk Precautions reviewed  No diagnosis found.    MDM

## 2011-07-11 NOTE — MAU Note (Signed)
Cramping since last night.  Having problems with feeling fluid leaking with bending over since last Sunday. Last intercourse 2 days ago

## 2011-07-13 ENCOUNTER — Other Ambulatory Visit (HOSPITAL_COMMUNITY): Payer: Self-pay | Admitting: Obstetrics and Gynecology

## 2011-07-13 DIAGNOSIS — O3680X0 Pregnancy with inconclusive fetal viability, not applicable or unspecified: Secondary | ICD-10-CM

## 2011-07-20 ENCOUNTER — Encounter (HOSPITAL_COMMUNITY): Payer: Self-pay

## 2011-07-20 ENCOUNTER — Inpatient Hospital Stay (HOSPITAL_COMMUNITY)
Admission: AD | Admit: 2011-07-20 | Discharge: 2011-07-20 | Disposition: A | Discharge status: Home / Self Care | Payer: Self-pay | Source: Ambulatory Visit | Attending: Obstetrics & Gynecology | Admitting: Obstetrics & Gynecology

## 2011-07-20 ENCOUNTER — Ambulatory Visit (HOSPITAL_COMMUNITY)
Admission: RE | Admit: 2011-07-20 | Discharge: 2011-07-20 | Disposition: A | Discharge status: Home / Self Care | Payer: Self-pay | Source: Ambulatory Visit | Attending: Obstetrics and Gynecology | Admitting: Obstetrics and Gynecology

## 2011-07-20 DIAGNOSIS — O3680X0 Pregnancy with inconclusive fetal viability, not applicable or unspecified: Secondary | ICD-10-CM | POA: Insufficient documentation

## 2011-07-20 DIAGNOSIS — Z3689 Encounter for other specified antenatal screening: Secondary | ICD-10-CM | POA: Insufficient documentation

## 2011-07-20 DIAGNOSIS — O039 Complete or unspecified spontaneous abortion without complication: Secondary | ICD-10-CM | POA: Insufficient documentation

## 2011-07-20 NOTE — MAU Note (Signed)
Follow up after ultrasound for confirmation. Patient denies any pain or bleeding.

## 2011-07-20 NOTE — MAU Provider Note (Signed)
Amber Branch is a 31 y.o. female @ [redacted]w[redacted]d gestation who presents to MAU for follow up ultrasound. The ultrasound today shows no progression since the previous ultrasound. Consistent with failed pregnancy.   I have reviewed this patient's vital signs, nurses notes, appropriate labs and imaging. I discussed the findings with Dr. Penne Lash. Will offer patient Cytotec and have her follow up in the GYN Clinic.  I discussed in detail with patient and her husband, using our hospital Spanish translator [employer], failed pregnancy and treatment options. Patient request expectant management. She has had several miscarriages in the past. She is a patient in the GYN Clinic.   Will schedule follow up for 2 weeks. She will return here as needed.  Comfort pack given.

## 2011-07-20 NOTE — Discharge Instructions (Signed)
Aborto espontneo (Miscarriage) Una interrupcin temprana del embarazo o aborto involuntario (aborto espontneo) es un problema frecuente. Generalmente ocurre cuando el embarazo no se desarrolla normalmente. Es muy improbable que usted o su pareja hayan hecho algo para que sucediera esto, aunque el humo del cigarrillo, las enfermedades de transmisin sexual, la ingesta excesiva de alcohol o abuso de drogas pueden aumentar el riesgo. Otras causas son:  Anormalidades del tero.   Problemas hormonales u otros trastornos.   Traumatismos y problemas genticos (cromosmicos).  Sufrir un aborto espontneo no modifica sus probabilidades de tener un embarazo normal en el futuro. El mdico le aconsejar cundo puede tratar de quedar embarazada otra vez. DESPUS DE UN ABORTO ESPONTNEO  Un aborto espontneo es inevitable cuando hay una hemorragia abundante y continua, clicos, dilatacin del cuello del tero o se eliminan tejidos del embarazo. La hemorragia y los clicos continuarn hasta que todos los tejidos se hayan retirado del tero.   A veces el tero no elimina completamente todos los tejidos, entonces es necesario administrar medicamentos o realizar una dilatacin y curetaje para retirar los restos de tejidos del embarazo. La dilatacin y curetaje raspa o succiona los tejidos para retirarlos.   Si usted es Rh negativa, usted puede necesitar tener inmunoglobulina Rh para evitar problemas de Rh.   Le darn medicamentos para combatir infecciones si el aborto fue provocado por un germen.  INSTRUCCIONES PARA EL CUIDADO DOMICILIARIO  Deber reposar durante los siguientes 2 a 3 das.   No tome baos de inmersin ni se haga duchas vaginales y no coloque nada en la vagina incluyendo tampones.   No mantenga relaciones sexuales hasta que su mdico lo autorice.   Evite los ejercicios o las actividades extenuantes hasta que su mdico lo autorice.   Guarde cualquier secrecin vaginal que pueda parecer un  tejido. Consulte al mdico si es necesario inspeccionar ese tejido.   Si usted o su pareja sufren culpa o duelo intenso, hable con su mdico para buscar la ayuda psicolgica que los ayude a enfrentar la prdida del embarazo.   Permtase el tiempo suficiente de duelo antes de quedar embarazada nuevamente.  SOLICITE ATENCIN MDICA DE INMEDIATO SI:  Observa una secrecin vaginal anormal, abundante o con mal olor.   Siente dolor abdominal o plvico continuo.   La temperatura oral le sube a ms de 102 F (38.9 C) y no puede bajarla con medicamentos.   Siente debilidad intensa, se desmaya, o sufre vmitos.   Comienza a sentir escalofros.   Es vctima de violencia familiar.  ASEGURESE DE QUE:  Comprende estas instrucciones.   Controlar su enfermedad.   Solicitar ayuda inmediatamente si no mejora o si empeora.  Document Released: 03/09/2005 Document Revised: 02/26/2011 [employer] Patient Information 2012 [employer]. 

## 2011-07-21 NOTE — MAU Provider Note (Signed)
Medical Screening exam and patient care preformed by advanced practice provider.  Agree with the above management.  

## 2011-07-22 ENCOUNTER — Encounter: Payer: Self-pay | Admitting: *Deleted

## 2011-07-27 ENCOUNTER — Other Ambulatory Visit: Payer: Self-pay

## 2011-07-30 ENCOUNTER — Inpatient Hospital Stay (HOSPITAL_COMMUNITY): Payer: Self-pay

## 2011-07-30 ENCOUNTER — Inpatient Hospital Stay (HOSPITAL_COMMUNITY)
Admission: AD | Admit: 2011-07-30 | Discharge: 2011-07-30 | Disposition: A | Discharge status: Home / Self Care | Payer: Self-pay | Source: Ambulatory Visit | Attending: Obstetrics & Gynecology | Admitting: Obstetrics & Gynecology

## 2011-07-30 ENCOUNTER — Encounter (HOSPITAL_COMMUNITY): Payer: Self-pay | Admitting: *Deleted

## 2011-07-30 DIAGNOSIS — O039 Complete or unspecified spontaneous abortion without complication: Secondary | ICD-10-CM

## 2011-07-30 HISTORY — DX: Other specified health status: Z78.9

## 2011-07-30 MED ORDER — IBUPROFEN 600 MG PO TABS: 600.0000 mg | ORAL_TABLET | Freq: Four times a day (QID) | ORAL | Status: AC | PRN

## 2011-07-30 MED ORDER — KETOROLAC TROMETHAMINE 60 MG/2ML IM SOLN
60.0000 mg | Freq: Once | INTRAMUSCULAR | Status: AC
  Administered 2011-07-30: 60 mg via INTRAMUSCULAR
  Filled 2011-07-30: qty 2

## 2011-07-30 NOTE — MAU Provider Note (Signed)
History     CSN: [id number]  Arrival date & time 07/30/11  1905   None     Chief Complaint  Patient presents with  . Vaginal Bleeding  . Abdominal Pain    HPI Amber Branch is a 31 y.o. female who presents to MAU for vaginal bleeding. She was evaluated here last week and has a follow up scheduled with the GYN Clinic for 5/13. On her initial visit her ultrasound showed an early pregnancy that was not consistent with her dates. She returned for a follow up ultrasound that showed a failed pregnancy. She elected expectant management and today began having heavy bleeding and cramping. She passed tissue that she saved. The history was provided by the patient using the hospital Spanish translator.  Past Medical History  Diagnosis Date  . No pertinent past medical history     Past Surgical History  Procedure Date  . No past surgeries     No family history on file.  History  Substance Use Topics  . Smoking status: Never Smoker   . Smokeless tobacco: Not on file  . Alcohol Use: No    OB History    Grav Para Term Preterm Abortions TAB SAB Ect Mult Living   7 3 3  0 3 0 3 0 0 3      Review of Systems  Constitutional: Negative for fever, chills and activity change.  HENT: Negative.   Eyes: Negative.   Respiratory: Negative.   Gastrointestinal: Positive for abdominal pain. Negative for nausea and vomiting.  Genitourinary: Positive for vaginal bleeding. Negative for dysuria and frequency.  Musculoskeletal: Positive for back pain.  Skin: Negative.   Neurological: Negative for dizziness and headaches.  Psychiatric/Behavioral: Negative for confusion and agitation.    Allergies  Codeine  Home Medications  No current outpatient prescriptions on file.  BP 109/79  Pulse 92  Temp(Src) 97.4 F (36.3 C) (Oral)  Resp 20  Ht 4' 11.5" (1.511 m)  Wt 188 lb (85.276 kg)  BMI 37.34 kg/m2  SpO2 100%  LMP 05/05/2011  Physical Exam  Nursing note and vitals  reviewed. Constitutional: She is oriented to person, place, and time. She appears well-developed and well-nourished. No distress.  HENT:  Head: Normocephalic.  Eyes: EOM are normal.  Neck: Neck supple.  Cardiovascular: Normal rate.   Pulmonary/Chest: Effort normal.  Abdominal: Soft. There is tenderness.       Mildly tender lower abdomen with palpation.  Musculoskeletal: Normal range of motion.  Neurological: She is alert and oriented to person, place, and time. No cranial nerve deficit.  Skin: Skin is warm and dry.  Psychiatric: She has a normal mood and affect. Her behavior is normal. Judgment and thought content normal.   US Ob Transvaginal  07/30/2011  *RADIOLOGY REPORT*  Clinical Data: Failed early intrauterine pregnancy.  Heavy bleeding.  TRANSVAGINAL OB ULTRASOUND  Technique:  Transvaginal ultrasound was performed for evaluation of the gestation as well as the maternal uterus and adnexal regions.  Comparison: 07/20/2011.  Findings: Transvaginal ultrasound was performed.  This demonstrates fluid in the cervix, likely representing abortion in progress and expulsion of the gestational sac.  The uterus appears normal. Ovaries have a normal physiologic appearance bilaterally.  The mean sac diameter is in the cervix is 1.09 cm.  The right ovary measures 29 mm x 14 mm x 20 mm.  The left ovary measures 30 mm x 14 mm x 20 mm.  Endometrium not specifically evaluated.  IMPRESSION: Findings compatible with failed  early pregnancy.  Gestational sac is in the cervix.  No fetus or embryo.  Original Report Authenticated By: Andreas Newport, M.D.   Blood type O positive. ED Course  Procedures Spec. Exam: using ring forceps tissue removed from cervical os. Bleeding decreased, cramping less.  Assessment: Complete SAB  Plan:  Toradol 60 mg IM   Keep appointment in GYN Clinic as scheduled   Return here as needed.   POC sent to pathology  MDM: Note the patient has 2 MR#.

## 2011-07-30 NOTE — Discharge Instructions (Signed)
Follow up in the GYN Clinic as scheduled. Return here as needed.  Aborto espontneo (Miscarriage) Una interrupcin temprana del embarazo o aborto involuntario (aborto espontneo) es un problema frecuente. Generalmente ocurre cuando el embarazo no se desarrolla normalmente. Es muy improbable que usted o su pareja hayan hecho algo para que sucediera esto, aunque el humo del [place], las enfermedades de transmisin sexual, la ingesta excesiva de alcohol o abuso de drogas pueden aumentar el riesgo. Burna Cash causas son:  Anormalidades del tero.   Problemas hormonales u otros trastornos.   Traumatismos y problemas genticos (cromosmicos).  Sufrir un aborto espontneo no modifica sus probabilidades de tener un embarazo normal en el futuro. El mdico le aconsejar cundo puede tratar de quedar embarazada otra vez. DESPUS DE UN ABORTO ESPONTNEO  Un aborto espontneo es inevitable cuando hay una hemorragia abundante y continua, clicos, dilatacin del cuello del tero o se eliminan tejidos del Psychiatrist. La hemorragia y los clicos continuarn hasta que todos los tejidos se hayan retirado del tero.   A veces el tero no elimina completamente todos los tejidos, entonces es necesario administrar medicamentos o realizar una dilatacin y curetaje para retirar los restos de tejidos del Psychiatrist. La dilatacin y curetaje raspa o succiona los tejidos para retirarlos.   Si usted es Rh negativa, usted puede [employer]er inmunoglobulina Rh para evitar problemas de Rh.   Le darn medicamentos para combatir infecciones si el aborto fue provocado por un germen.  INSTRUCCIONES PARA EL CUIDADO DOMICILIARIO  Deber reposar [employer] siguientes 2 a 3 das.   No tome baos de inmersin ni se haga duchas vaginales y no coloque nada en la vagina incluyendo tampones.   No mantenga relaciones sexuales hasta que su mdico lo autorice.   Evite los ejercicios o las actividades extenuantes hasta que su mdico lo  autorice.   Guarde cualquier secrecin vaginal que pueda parecer un tejido. Consulte al mdico si es necesario inspeccionar ese tejido.   Si usted o su pareja sufren culpa o duelo intenso, hable con su mdico para buscar la [employer] ayude a Runner, broadcasting/film/video la prdida del Psychiatrist.   Permtase el tiempo suficiente de duelo antes de quedar embarazada nuevamente.  SOLICITE ATENCIN MDICA DE INMEDIATO SI:  Brett Fairy secrecin vaginal anormal, abundante o con mal olor.   Siente dolor abdominal o plvico continuo.   La temperatura oral le sube a ms de 102 F (38.9 C) y no puede bajarla con medicamentos.   Siente debilidad intensa, se desmaya, o sufre vmitos.   Comienza a sentir escalofros.   Es vctima de Advertising copywriter.  ASEGURESE DE QUE:  Comprende estas instrucciones.   Controlar su enfermedad.   Solicitar ayuda inmediatamente si no mejora o si empeora.  Document Released: 03/09/2005 Document Revised: 02/26/2011 [facility] Patient Information 2012 [place], [name].

## 2011-07-30 NOTE — MAU Note (Signed)
Pt reports she was seen here 04/29 and was told the preg was not developing. Pt reports she has been bleeding x 2 weeks, about 40 minutes ago began to bleed heavily. Lower abd pain back pain started about 45 minutes ago and passed ?something. Pain continues. Pt states she has saturated a pad and is passing clots. LMP 02/02

## 2011-07-31 ENCOUNTER — Encounter (HOSPITAL_COMMUNITY): Payer: Self-pay

## 2011-08-01 ENCOUNTER — Encounter (HOSPITAL_COMMUNITY): Payer: Self-pay

## 2011-08-01 ENCOUNTER — Inpatient Hospital Stay (HOSPITAL_COMMUNITY)
Admission: AD | Admit: 2011-08-01 | Discharge: 2011-08-01 | Disposition: A | Discharge status: Home / Self Care | Payer: Self-pay | Source: Ambulatory Visit | Attending: Obstetrics and Gynecology | Admitting: Obstetrics and Gynecology

## 2011-08-01 DIAGNOSIS — O039 Complete or unspecified spontaneous abortion without complication: Secondary | ICD-10-CM | POA: Insufficient documentation

## 2011-08-01 DIAGNOSIS — R109 Unspecified abdominal pain: Secondary | ICD-10-CM | POA: Insufficient documentation

## 2011-08-01 DIAGNOSIS — M549 Dorsalgia, unspecified: Secondary | ICD-10-CM | POA: Insufficient documentation

## 2011-08-01 LAB — CBC
HCT: 36.1 % (ref 36.0–46.0)
Hemoglobin: 11.9 g/dL — ABNORMAL LOW (ref 12.0–15.0)
MCH: 29.8 pg (ref 26.0–34.0)
MCHC: 33 g/dL (ref 30.0–36.0)
MCV: 90.3 fL (ref 78.0–100.0)
RBC: 4 MIL/uL (ref 3.87–5.11)

## 2011-08-01 LAB — URINALYSIS, ROUTINE W REFLEX MICROSCOPIC
Glucose, UA: NEGATIVE mg/dL
Ketones, ur: NEGATIVE mg/dL
Protein, ur: NEGATIVE mg/dL
Urobilinogen, UA: 0.2 mg/dL (ref 0.0–1.0)

## 2011-08-01 LAB — URINE MICROSCOPIC-ADD ON

## 2011-08-01 LAB — WET PREP, GENITAL: Yeast Wet Prep HPF POC: NONE SEEN

## 2011-08-01 LAB — OB RESULTS CONSOLE GC/CHLAMYDIA: Gonorrhea: NEGATIVE

## 2011-08-01 MED ORDER — OXYCODONE-ACETAMINOPHEN 5-325 MG PO TABS
2.0000 | ORAL_TABLET | ORAL | Status: AC
  Administered 2011-08-01: 2 via ORAL
  Filled 2011-08-01: qty 2

## 2011-08-01 MED ORDER — OXYCODONE-ACETAMINOPHEN 5-325 MG PO TABS: 2.0000 | ORAL_TABLET | ORAL | Status: AC | PRN

## 2011-08-01 NOTE — Discharge Instructions (Signed)
Aborto (aborto espontneo) (Miscarriage [Spontaneous Miscarriage]) Se denomina aborto a la prdida del beb antes de la vigsima semana de embarazo. Ocurren en el 15 al 20% de los embarazos. La mayor parte sucede en las primeras 13 semanas. En trminos mdicos se denomina aborto espontneo o prdida prematura del embarazo. No es necesario realizar un tratamiento adicional cuando el aborto es completo y todos los componentes de la concepcin se han eliminado. Casi siempre puede tratar de embarazarse nuevamente tan pronto el profesional la autorice. CAUSAS  La mayora de las causas no se conocen.   Problemas genticos como cromosomas anormales, insuficientes o excesivos.   Infeccin en el cuello del tero.   tero de forna anormal, fibromas o anormalidades congnitas.   Problemas hormonales.   Problemas mdicos.   El cuello del tero es incompetente, los tejidos no son lo suficientemente fuertes como para contener el embarazo.   El hbito de fumar, ingesta excesiva de alcohol y consumo de drogas.   Traumatismos.  SNTOMAS  Hemorragia o manchado por la vagina.   Clicos en la zona abdominal inferior.   Eliminacin de lquido por la vagina, con o sin clicos o dolor.   Eliminacin de tejidos fetales.  TRATAMIENTO  Si se eliminan todos los tejidos del tero no es necesario realizar tratamiento.   Si no se eliminan completamente los tejidos uterinos o la placenta queda dentro del tero (aborto incompleto), pueden infectarse. En muchos casos es necesario realizar dilatacin y curetaje por succin o legrado del tero para retirar los restos de tejido. El procedimiento slo se lleva a cabo cuando el profesional sabe que no existe posibilidad de que el embarazo contine. Esta decisin se toma luego de realizar un examen fsico, se realiza un test de embarazo y ste es negativo, los resultados de los anlisis de sangre y de la ecografa indican que hay un feto muerto o no hay feto en  desarrollo debido a algn problema ocurrido en el momento de la concepcin (cuando el esperma y el vulo se unen).   Podr ser necesario que tome medicamentos, antibiticos si hay infeccin o medicamentos para contraer el tero si tiene una hemorragia abundante.   Si su sangre es Rh negativa y su pareja es Rh positivo, necesitar la vacuna Rho-gam (inmunoglobulina). De este modo evitar sufrir problemas de compatibilidad Rh en futuros embarazos.  INSTRUCCIONES PARA EL CUIDADO DOMICILIARIO  El mdico podr indicarle reposo en cama (slo podr levantarse para ir al bao). Podr autorizarla a realizar una actividad ligera. En este momento usted necesitar hacer algunos arreglos para que otra persona se ocupe del cuidado de los nios y de otras responsabilidades adicionales.   Lleve un registro de la cantidad y la saturacin de las toallas higinicas que utiliza cada da. Anote esta informacin.   No use tampones. No se haga duchas vaginales ni tenga relaciones sexuales hasta que el mdico la autorice.   Tome slo medicamentos de venta libre o prescriptos para aliviar el dolor, las molestias o para bajar la fiebre, segn las indicaciones del mdico.   No tome aspirina, ya que puede causar hemorragias.   Es muy importante que cumpla con todas las citas de control para realizar nuevas evaluaciones y continuar el tratamiento.   Informe a su mdico si sufre violencia domstica.   Las mujeres con tipo sanguneo Rh negativo (A, B, AB o 0 negativo deben recibir una droga denominada inmuno globulina Rh (D) (RhoGam ) Este medicamento ayuda a proteger al feto de futuros problemas que puedan   ocurrir si una madre Rh negativo da a luz a un beb Rh positivo.   Si usted o su pareja sufren culpa o duelo intenso, hable con su mdico para buscar la ayuda psicolgica que los ayude a enfrentar la prdida del embarazo. Permtase el tiempo suficiente de duelo antes de quedar embarazada nuevamente.  SOLICITE ATENCIN  MDICA DE INMEDIATO SI:  Siente calambres intensos en el estmago, en la espalda o en el vientre (abdomen).   Tiene fiebre.   Elimina cogulos o tejidos grandes. Guarde una muestra de esos tejidos para que el profesional lo inspeccione.   La hemorragia aumenta.   Se siente mareada, dbil o tiene episodios de desmayo.   Comienza a sentir escalofros  Document Released: 12/17/2004 Document Revised: 02/26/2011 [employer] Patient Information 2012 [employer]. 

## 2011-08-01 NOTE — MAU Note (Signed)
Patient is here with c/o of lower abdominal pain and back pain including a headache. She states that she took ibuprophen 600mg  about without relief. She is having scant amount of bleeding. She c/o lower abdominal tenderness.

## 2011-08-01 NOTE — MAU Provider Note (Signed)
History     CSN: [id number]  Arrival date and time: 08/01/11 2138   First Provider Initiated Contact with Patient 08/01/11 2202     31 [id number] with complete SAB diagnosed in MAU on 07/30/11  Chief Complaint  Patient presents with  . Abdominal Pain   HPI Pt called MAU, then presented with abdominal and back pain and h/a.  Her abdominal and back pain is described as low in the front of her abdomen and in her back and is cramping pain not relieved by ibuprofen prescribed on Thursday.  Her h/a started this morning and is described as dull, frontal, and severe.  She denies n/v, dizziness, urinary symptoms, vaginal itching/burning, or fever/chills.    OB History    Grav Para Term Preterm Abortions TAB SAB Ect Mult Living   7 3 3  4  4   3       Past Medical History  Diagnosis Date  . No pertinent past medical history     Past Surgical History  Procedure Date  . No past surgeries     Family History  Problem Relation Age of Onset  . Anesthesia problems Neg Hx   . Hypotension Neg Hx   . Malignant hyperthermia Neg Hx   . Pseudochol deficiency Neg Hx     History  Substance Use Topics  . Smoking status: Never Smoker   . Smokeless tobacco: Not on file  . Alcohol Use: No    Allergies:  Allergies  Allergen Reactions  . Codeine Rash    Can take tylenol with codeine    Prescriptions prior to admission  Medication Sig Dispense Refill  . acetaminophen-codeine (TYLENOL #3) 300-30 MG per tablet Take 1-2 tablets by mouth every 6 (six) hours as needed. As needed for pain      . ibuprofen (ADVIL,MOTRIN) 600 MG tablet Take 1 tablet (600 mg total) by mouth every 6 (six) hours as needed for pain.  30 tablet  0  . Prenatal Vit-Fe Fumarate-FA (PRENATAL MULTIVITAMIN) TABS Take 1 tablet by mouth at bedtime.        Review of Systems  Constitutional: Negative for fever, chills and malaise/fatigue.  Eyes: Negative for blurred vision.  Respiratory: Negative for cough and shortness of  breath.   Cardiovascular: Negative for chest pain.  Gastrointestinal: Positive for abdominal pain. Negative for heartburn, nausea and vomiting.  Genitourinary: Negative for dysuria, urgency and frequency.  Musculoskeletal: Positive for back pain.  Neurological: Positive for headaches. Negative for dizziness.  Psychiatric/Behavioral: Negative for depression.   Physical Exam   Blood pressure 119/70, pulse 109, temperature 98.3 F (36.8 C), temperature source Oral, resp. rate 20, height 4' 10.75" (1.492 m), weight 84.936 kg (187 lb 4 oz), last menstrual period 05/05/2011, unknown if currently breastfeeding.  Physical Exam  Nursing note and vitals reviewed. Constitutional: She is oriented to person, place, and time. She appears well-developed and well-nourished.  Neck: Normal range of motion.  Cardiovascular: Normal rate, regular rhythm and normal heart sounds.   Respiratory: Effort normal and breath sounds normal.  GI: Soft.  Genitourinary:       Pelvic exam: Cervix pink, visually closed, moderate amount watery bleeding in vaginal vault, vaginal walls and external genitalia normal  Bimanual exam: Cervix 0/long/high, soft, anterior, neg CMT, uterus with significant tenderness upon palpation, slightly enlarged, adnexa without enlargement or mass   Musculoskeletal: Normal range of motion.  Neurological: She is alert and oriented to person, place, and time.  Skin: Skin is warm and  dry.  Psychiatric: She has a normal mood and affect. Her behavior is normal. Judgment and thought content normal.   Results for orders placed during the hospital encounter of 08/01/11 (from the past 24 hour(s))  URINALYSIS, ROUTINE W REFLEX MICROSCOPIC     Status: Abnormal   Collection Time   08/01/11  9:51 PM      Component Value Range   Color, Urine RED (*) YELLOW    APPearance CLEAR  CLEAR    Specific Gravity, Urine 1.025  1.005 - 1.030    pH 5.5  5.0 - 8.0    Glucose, UA NEGATIVE  NEGATIVE (mg/dL)   Hgb  urine dipstick LARGE (*) NEGATIVE    Bilirubin Urine NEGATIVE  NEGATIVE    Ketones, ur NEGATIVE  NEGATIVE (mg/dL)   Protein, ur NEGATIVE  NEGATIVE (mg/dL)   Urobilinogen, UA 0.2  0.0 - 1.0 (mg/dL)   Nitrite NEGATIVE  NEGATIVE    Leukocytes, UA MODERATE (*) NEGATIVE   URINE MICROSCOPIC-ADD ON     Status: Normal   Collection Time   08/01/11  9:51 PM      Component Value Range   Squamous Epithelial / LPF RARE  RARE    WBC, UA 3-6  <3 (WBC/hpf)   RBC / HPF TOO NUMEROUS TO COUNT  <3 (RBC/hpf)   Urine-Other MUCOUS PRESENT    HCG, QUANTITATIVE, PREGNANCY     Status: Abnormal   Collection Time   08/01/11 10:02 PM      Component Value Range   hCG, Beta Chain, Quant, S 141 (*) <5 (mIU/mL)  CBC     Status: Abnormal   Collection Time   08/01/11 10:14 PM      Component Value Range   WBC 6.6  4.0 - 10.5 (K/uL)   RBC 4.00  3.87 - 5.11 (MIL/uL)   Hemoglobin 11.9 (*) 12.0 - 15.0 (g/dL)   HCT 45.4  09.8 - 11.9 (%)   MCV 90.3  78.0 - 100.0 (fL)   MCH 29.8  26.0 - 34.0 (pg)   MCHC 33.0  30.0 - 36.0 (g/dL)   RDW 14.7  82.9 - 56.2 (%)   Platelets 173  150 - 400 (K/uL)  WET PREP, GENITAL     Status: Abnormal   Collection Time   08/01/11 10:15 PM      Component Value Range   Yeast Wet Prep HPF POC NONE SEEN  NONE SEEN    Trich, Wet Prep NONE SEEN  NONE SEEN    Clue Cells Wet Prep HPF POC NONE SEEN  NONE SEEN    WBC, Wet Prep HPF POC FEW (*) NONE SEEN    MAU Course  Procedures Percocet 5/325 tabs x2 given in MAU   Assessment and Plan  SAB Significant drop in quantitative hcg since 07/30/11   D/C home with bleeding precautions Additional tissue sent to pathology following pelvic exam Percocet 5/325 1-2 tabs Q4 hours PRN Continue ibuprofen as prescribed Return to MAU as needed  LEFTWICH-KIRBY, Glorya Bartley 08/01/2011, 10:26 PM

## 2011-08-03 ENCOUNTER — Encounter: Payer: Self-pay | Admitting: Family Medicine

## 2011-08-03 ENCOUNTER — Encounter: Payer: Self-pay | Admitting: Obstetrics and Gynecology

## 2011-08-03 ENCOUNTER — Ambulatory Visit (INDEPENDENT_AMBULATORY_CARE_PROVIDER_SITE_OTHER): Payer: Self-pay | Admitting: Obstetrics and Gynecology

## 2011-08-03 VITALS — BP 108/68 | HR 90 | Temp 97.6°F | Ht <= 58 in | Wt 186.4 lb

## 2011-08-03 DIAGNOSIS — O039 Complete or unspecified spontaneous abortion without complication: Secondary | ICD-10-CM

## 2011-08-03 LAB — GC/CHLAMYDIA PROBE AMP, GENITAL
Chlamydia, DNA Probe: NEGATIVE
GC Probe Amp, Genital: NEGATIVE

## 2011-08-03 NOTE — MAU Provider Note (Signed)
Agree with above note.  Amber Branch 08/03/2011 12:00 PM

## 2011-08-03 NOTE — Patient Instructions (Signed)
Aborto espontneo (Miscarriage) Una interrupcin temprana del embarazo o aborto involuntario (aborto espontneo) es un problema frecuente. Generalmente ocurre cuando el embarazo no se desarrolla normalmente. Es muy improbable que usted o su pareja hayan hecho algo para que sucediera esto, aunque el humo del cigarrillo, las enfermedades de transmisin sexual, la ingesta excesiva de alcohol o abuso de drogas pueden aumentar el riesgo. Otras causas son:  Anormalidades del tero.   Problemas hormonales u otros trastornos.   Traumatismos y problemas genticos (cromosmicos).  Sufrir un aborto espontneo no modifica sus probabilidades de tener un embarazo normal en el futuro. El mdico le aconsejar cundo puede tratar de quedar embarazada otra vez. DESPUS DE UN ABORTO ESPONTNEO  Un aborto espontneo es inevitable cuando hay una hemorragia abundante y continua, clicos, dilatacin del cuello del tero o se eliminan tejidos del embarazo. La hemorragia y los clicos continuarn hasta que todos los tejidos se hayan retirado del tero.   A veces el tero no elimina completamente todos los tejidos, entonces es necesario administrar medicamentos o realizar una dilatacin y curetaje para retirar los restos de tejidos del embarazo. La dilatacin y curetaje raspa o succiona los tejidos para retirarlos.   Si usted es Rh negativa, usted puede necesitar tener inmunoglobulina Rh para evitar problemas de Rh.   Le darn medicamentos para combatir infecciones si el aborto fue provocado por un germen.  INSTRUCCIONES PARA EL CUIDADO DOMICILIARIO  Deber reposar durante los siguientes 2 a 3 das.   No tome baos de inmersin ni se haga duchas vaginales y no coloque nada en la vagina incluyendo tampones.   No mantenga relaciones sexuales hasta que su mdico lo autorice.   Evite los ejercicios o las actividades extenuantes hasta que su mdico lo autorice.   Guarde cualquier secrecin vaginal que pueda parecer un  tejido. Consulte al mdico si es necesario inspeccionar ese tejido.   Si usted o su pareja sufren culpa o duelo intenso, hable con su mdico para buscar la ayuda psicolgica que los ayude a enfrentar la prdida del embarazo.   Permtase el tiempo suficiente de duelo antes de quedar embarazada nuevamente.  SOLICITE ATENCIN MDICA DE INMEDIATO SI:  Observa una secrecin vaginal anormal, abundante o con mal olor.   Siente dolor abdominal o plvico continuo.   La temperatura oral le sube a ms de 102 F (38.9 C) y no puede bajarla con medicamentos.   Siente debilidad intensa, se desmaya, o sufre vmitos.   Comienza a sentir escalofros.   Es vctima de violencia familiar.  ASEGURESE DE QUE:  Comprende estas instrucciones.   Controlar su enfermedad.   Solicitar ayuda inmediatamente si no mejora o si empeora.  Document Released: 03/09/2005 Document Revised: 02/26/2011 Exit[employer] Patient Information 2012 [employer]. 

## 2011-08-03 NOTE — MAU Provider Note (Signed)
Medical Screening exam and patient care preformed by advanced practice provider.  Agree with the above management.  

## 2011-08-03 NOTE — Progress Notes (Signed)
Patient ID: Amber Branch, female   DOB: [birth year], 31 y.o.   MRN: [id number] Amber Branch [id number]  Chief Complaint  Patient presents with  . Follow-up    missed AB      SUBJECTIVE  HPI: SAB dx'd at 07/30/11 visit. Hx c/w blighted ovum->SAB. No YS was documented on [place]. She had a quant on 08/01/10 which was down to 141. POC sent on 5/11 but no path result yet.  She is having light staining only and has mild cramping. Has ibuprofen and Percocet supply.  She presents today very upset that she has had 4 SABs (1 of her 3 FT births was after the 2nd SAB) and did have full APLS labs done 03/2011. States no one ever explained results or can tell her why she keeps miscarrying. Very upset and very worried about the financial burden of the tests and visits. Offered for her to speak to Amber Branch or genetic counseler/MFM but she declines. She initially declined to get a quant today but after talking with Amber Branch will proceed with that. Requests all records of APLS w/u. TSH was normall in 2012. Hgb A1C was 5.3 12/2010.   Past Medical History  Diagnosis Date  . No pertinent past medical history    Past Surgical History  Procedure Date  . No past surgeries    History   Social History  . Marital Status: Single    Spouse Name: N/A    Number of Children: N/A  . Years of Education: N/A   Occupational History  . Not on file.   Social History Main Topics  . Smoking status: Never Smoker   . Smokeless tobacco: Not on file  . Alcohol Use: No  . Drug Use: No  . Sexually Active: Yes    Birth Control/ Protection: None   Other Topics Concern  . Not on file   Social History Narrative   ** Merged History Encounter **    Current Outpatient Prescriptions on File Prior to Visit  Medication Sig Dispense Refill  . oxyCODONE-acetaminophen (PERCOCET) 5-325 MG per tablet Take 2 tablets by mouth every 4 (four) hours as needed for pain.  20 tablet  0  . Prenatal Vit-Fe Fumarate-FA (PRENATAL  MULTIVITAMIN) TABS Take 1 tablet by mouth at bedtime.      Marland Kitchen ibuprofen (ADVIL,MOTRIN) 600 MG tablet Take 1 tablet (600 mg total) by mouth every 6 (six) hours as needed for pain.  30 tablet  0   Allergies  Allergen Reactions  . Codeine Rash    Can take tylenol with codeine    ROS: Pertinent items in HPI  OBJECTIVE Blood pressure 108/68, pulse 90, temperature 97.6 F (36.4 C), temperature source Oral, height 4\' 9"  (1.448 m), weight 186 lb 6.4 oz (84.55 kg), last menstrual period 05/05/2011, not currently breastfeeding.  GENERAL: Well-developed, well-nourished female  HEENT: Normocephalic, good dentition HEART: normal rate RESP: normal effort Pelvic exam deferred per pt request   LAB RESULTS  Reviewed APLS lab results with Amber Branch: ACA IgM elevated and Pro C elevated. (not c/w APLS)     ASSESSMENTPLAN S/P SAB with falling quant _> follow to <5 since no YS on [place]. Will repeat today if consents.  Repetitive pregnancy losses-> Records of results of APLS work up and TSH given to pt. 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  D/W Amber Branch Interpreter from [facility] and [facility] both reiterated this with pt. Still declines genetic counseler or MFM referral. Will  see Amber Branch here.  Will call pt with results of pathology and quant.     Amber Branch 08/03/2011 2:50 PM

## 2011-08-04 LAB — HCG, QUANTITATIVE, PREGNANCY: hCG, Beta Chain, Quant, S: 44.3 m[IU]/mL

## 2011-08-10 ENCOUNTER — Other Ambulatory Visit: Payer: Self-pay

## 2011-08-11 ENCOUNTER — Other Ambulatory Visit: Payer: Self-pay

## 2011-08-11 DIAGNOSIS — O039 Complete or unspecified spontaneous abortion without complication: Secondary | ICD-10-CM

## 2011-08-12 ENCOUNTER — Telehealth: Payer: Self-pay | Admitting: *Deleted

## 2011-08-12 NOTE — Telephone Encounter (Signed)
Message copied by Mannie Stabile on Wed Aug 12, 2011  4:15 PM ------      Message from: Levie Heritage      Created: Wed Aug 12, 2011  2:15 PM       Please call patient with result - quant normal now.  No need for further blood work.

## 2011-08-13 NOTE — Telephone Encounter (Signed)
Called pt with Spanish interpreter, Amber Branch, and informed pt that her quant levels are low and that there is no need for further lab work.  Pt stated understanding and had no further questions.

## 2011-10-01 ENCOUNTER — Inpatient Hospital Stay (HOSPITAL_COMMUNITY): Payer: Self-pay

## 2011-10-01 ENCOUNTER — Inpatient Hospital Stay (HOSPITAL_COMMUNITY)
Admission: AD | Admit: 2011-10-01 | Discharge: 2011-10-01 | Disposition: A | Discharge status: Home / Self Care | Payer: Self-pay | Source: Ambulatory Visit | Attending: Obstetrics & Gynecology | Admitting: Obstetrics & Gynecology

## 2011-10-01 ENCOUNTER — Encounter (HOSPITAL_COMMUNITY): Payer: Self-pay | Admitting: *Deleted

## 2011-10-01 DIAGNOSIS — Z3490 Encounter for supervision of normal pregnancy, unspecified, unspecified trimester: Secondary | ICD-10-CM

## 2011-10-01 DIAGNOSIS — O99891 Other specified diseases and conditions complicating pregnancy: Secondary | ICD-10-CM | POA: Insufficient documentation

## 2011-10-01 DIAGNOSIS — R1032 Left lower quadrant pain: Secondary | ICD-10-CM | POA: Insufficient documentation

## 2011-10-01 LAB — URINALYSIS, ROUTINE W REFLEX MICROSCOPIC
Leukocytes, UA: NEGATIVE
Protein, ur: NEGATIVE mg/dL
Specific Gravity, Urine: 1.01 (ref 1.005–1.030)
Urobilinogen, UA: 0.2 mg/dL (ref 0.0–1.0)

## 2011-10-01 NOTE — MAU Note (Signed)
Patient states she has had pain on the left lower abdomen for several days. Denies any bleeding. Had a miscarriage on 5-29 and has not had a period.

## 2011-10-01 NOTE — MAU Provider Note (Signed)
History     CSN: [id number]  Arrival date and time: 10/01/11 1630   None     Chief Complaint  Patient presents with  . Abdominal Pain   HPI This is a 31 y.o. female at [redacted]w[redacted]d by LMP who presents with c/o LLQ and LMQ pain for several days. Had miscarriage in May. Last quant was <2 then. Denies bleeding. Denies N/V/D/C or fever.   OB History    Grav Para Term Preterm Abortions TAB SAB Ect Mult Living   8 3 3  4  4   3       Past Medical History  Diagnosis Date  . No pertinent past medical history     Past Surgical History  Procedure Date  . No past surgeries     Family History  Problem Relation Age of Onset  . Anesthesia problems Neg Hx   . Hypotension Neg Hx   . Malignant hyperthermia Neg Hx   . Pseudochol deficiency Neg Hx   . Other Neg Hx     History  Substance Use Topics  . Smoking status: Never Smoker   . Smokeless tobacco: Not on file  . Alcohol Use: No    Allergies:  Allergies  Allergen Reactions  . Codeine Rash    Can take tylenol with codeine    Prescriptions prior to admission  Medication Sig Dispense Refill  . Prenatal Vit-Fe Fumarate-FA (PRENATAL MULTIVITAMIN) TABS Take 1 tablet by mouth at bedtime.        ROS As in HPI  Physical Exam   Blood pressure 117/69, pulse 83, temperature 98.4 F (36.9 C), temperature source Oral, resp. rate 20, height 4\' 11"  (1.499 m), weight 189 lb 9.6 oz (86.002 kg), last menstrual period 07/22/2011, SpO2 100.00%.  Physical Exam  Constitutional: She is oriented to person, place, and time. She appears well-developed and well-nourished. No distress.  Cardiovascular: Normal rate.   Respiratory: Effort normal.  GI: Soft. She exhibits no distension and no mass. There is no tenderness. There is no rebound and no guarding.  Genitourinary: Vagina normal and uterus normal. No vaginal discharge found.  Musculoskeletal: Normal range of motion.  Neurological: She is alert and oriented to person, place, and time.    Skin: Skin is warm and dry.  Psychiatric: She has a normal mood and affect.   US Ob Transvaginal  10/01/2011  *RADIOLOGY REPORT*  Clinical Data: Left lower quadrant pain  OBSTETRIC <14 WK [place] AND TRANSVAGINAL OB US  Technique:  Both transabdominal and transvaginal ultrasound examinations were performed for complete evaluation of the gestation as well as the maternal uterus, adnexal regions, and pelvic cul-de-sac.  Transvaginal technique was performed to assess early pregnancy.  Comparison:  07/30/2011  Intrauterine gestational sac:  Fundal gestational sac is round and present.  No subchorionic hemorrhage. Yolk sac: Present Embryo: Present Cardiac Activity: Present Heart Rate: 147 bpm  MSD:   mm      w     d CRL: 14.7   mm  seven   w  six   d        [place] EDC: 05/13/2012  Maternal uterus/adnexae: Ovaries are within normal limits.  Small 6 mm lower uterine fibroid.  IMPRESSION: Live intrauterine gestation with an estimated gestational age of [redacted] weeks and 6 days.  Fetal heart rate is 147 beats per minute.  6 mm uterine fibroid.  Original Report Authenticated By: Donavan Burnet, M.D.   MAU Course  Procedures  MDM Quant and [place]  ordered:   Cultures done in May were negative   Assessment and Plan  A:  SIUP at 7.6 weeks       P:  Discharge home       Start prenatal care  [facility] 10/01/2011, 6:45 PM

## 2011-10-06 ENCOUNTER — Encounter: Payer: Self-pay | Admitting: Family Medicine

## 2011-10-06 ENCOUNTER — Ambulatory Visit (INDEPENDENT_AMBULATORY_CARE_PROVIDER_SITE_OTHER): Payer: Self-pay | Admitting: Family Medicine

## 2011-10-06 VITALS — BP 109/72 | HR 87 | Ht 59.0 in | Wt 186.0 lb

## 2011-10-06 DIAGNOSIS — Z331 Pregnant state, incidental: Secondary | ICD-10-CM

## 2011-10-06 DIAGNOSIS — R5383 Other fatigue: Secondary | ICD-10-CM

## 2011-10-06 DIAGNOSIS — R5381 Other malaise: Secondary | ICD-10-CM

## 2011-10-06 DIAGNOSIS — L259 Unspecified contact dermatitis, unspecified cause: Secondary | ICD-10-CM

## 2011-10-06 DIAGNOSIS — L309 Dermatitis, unspecified: Secondary | ICD-10-CM

## 2011-10-06 DIAGNOSIS — Z3201 Encounter for pregnancy test, result positive: Secondary | ICD-10-CM

## 2011-10-06 MED ORDER — TRIAMCINOLONE ACETONIDE 0.5 % EX OINT: TOPICAL_OINTMENT | Freq: Two times a day (BID) | CUTANEOUS | Status: DC

## 2011-10-06 NOTE — Progress Notes (Signed)
Subjective:     Patient ID: Amber Branch, female   DOB: 02-14-1981, 31 y.o.   MRN: [id number]  HPI Ms . Ehler is a 31 y/o history with a history of recent miscarriage in May who comes to clinic today for referral to High Risk OB clinic for a new pregnancy. Visit conducted through spanish interpreter.   She states that she was having abdominal pain last week, and went to [facility]. They worked up the pain and did an ultrasound, which confirmed a normal pregnancy.  Given her history of miscarriages (she reports having 4 in the past) she was told by the [facility] staff that they wanted to follow her in the High Risk OB Clinic, but she needed a referral from her PCP.   Today she has no complaints, specifically denying pain, bleeding, vaginal discharge or other symptoms.   She does state that she is anxious about the pregnancy given the recent miscarriages.   Per ED U/S results, fetus is [redacted]w[redacted]d today.   Per the patient she is [id number]  Attending Note Patient seen and examined by me, with medical student Lenise Herald (MS3, [facility]).  I agree with history as documented above.  See my separate note for further details.  JB  Review of Systems As per above.     Objective:   Physical Exam Deferred See Attending Note for physical exam. JB  Assessment:  31 y/o woman, [id number] who is seen in clinic to begin referral process to High Risk OB clinic due to history of multiple miscarriages.  Plan:  1. Will begin process of referral, needs [facility] evaluation before initial visit and official referral for High Risk OB.   Agree with above. JB

## 2011-10-06 NOTE — Patient Instructions (Addendum)
Fue un Research officer, trade union.  Segun el ultrasonido que se hizo en [employer] de la [place] el 11 de [place], tiene 8 semanas y 4 dias de Engineer, petroleum.   Por lo visto, no podemos hacerle ningun estudio de [employer] no este' inscrita en el programa de [facility].  Estoy entrando todas las ordenes para los laboratorios hoy, para que pase una consulta para laboratorios solamente.  Entre los estudios en una prueba de glucosa que requiere de una hora aqui en el consultorio.   Siga tomando las vitaminas antenatales.   OB LAB VISIT AFTER AAM CERTIFICATION (SCHEDULED TO SEE [facility] TOMORROW).  INITIAL OB VISIT WITH NEXT-AVAIL RESIDENT AS SOON AFTER LABS AS POSSIBLE (NEXT 1-2 WEEKS).

## 2011-10-07 NOTE — Assessment & Plan Note (Signed)
October 06, 2011: Amber Branch is now a [id number] at [redacted]w[redacted]d by [place] at [facility].  She has been a patient of [facility] in the past, has appt with them tomorrow to apply for AAM placement. Will need prenatal labs and IOB visit here before being referred to [facility].  To continue taking PNVs.  She has proof of pregnancy for the AAM program tomorrow. Will attempt to make her prenatal lab appt and New OB visit appointments today in anticipation of her AAM placement in [facility].  JB

## 2011-10-07 NOTE — Progress Notes (Signed)
  Subjective:    Patient ID: Amber Branch, female    DOB: 1980-09-23, 31 y.o.   MRN: [id number]  HPI Visit conducted in Spanish.  Amber Branch comes in today at 8 weeks and 4 days pregnancy, determined by US done at [facility] on July 11th.  She is now a [id number], with 3 NSVDs and 4 first-trimester spontaneous Abs, the most recent one being in April of this year.  She had not had menses since that SAB, now is pregnant and desirous of pregnancy.  She is taking PNVs, is concerned about possibility of pregnancy loss.  She is a nonsmoker, no alcohol or drugs.     Review of Systems No cramping or bleeding, no vaginal discharge.      Objective:   Physical Exam Well appearing, no apparent distress HEENT Neck supple.  ABD Soft, nontender. No FHR attempted given gestational age of [redacted] weeks.        Assessment & Plan:

## 2011-10-16 ENCOUNTER — Other Ambulatory Visit: Payer: Self-pay

## 2011-10-16 DIAGNOSIS — Z331 Pregnant state, incidental: Secondary | ICD-10-CM

## 2011-10-16 DIAGNOSIS — R5383 Other fatigue: Secondary | ICD-10-CM

## 2011-10-16 LAB — TSH: TSH: 0.779 u[IU]/mL (ref 0.350–4.500)

## 2011-10-16 LAB — GLUCOSE, CAPILLARY
Comment 1: 1
Glucose-Capillary: 158 mg/dL — ABNORMAL HIGH (ref 70–99)

## 2011-10-16 NOTE — Progress Notes (Signed)
Drew OB labs, also 1 hr Glucose = 158 mg/dL;  3 hr GTT scheduled for 10-20-11.

## 2011-10-17 LAB — OBSTETRIC PANEL
Basophils Absolute: 0 10*3/uL (ref 0.0–0.1)
Basophils Relative: 0 % (ref 0–1)
Eosinophils Absolute: 0.1 10*3/uL (ref 0.0–0.7)
Hepatitis B Surface Ag: NEGATIVE
MCH: 30.1 pg (ref 26.0–34.0)
MCHC: 34.5 g/dL (ref 30.0–36.0)
Neutrophils Relative %: 63 % (ref 43–77)
Platelets: 220 10*3/uL (ref 150–400)
RBC: 4.12 MIL/uL (ref 3.87–5.11)
RDW: 14.3 % (ref 11.5–15.5)

## 2011-10-17 LAB — SICKLE CELL SCREEN: Sickle Cell Screen: NEGATIVE

## 2011-10-18 LAB — CULTURE, OB URINE: Colony Count: 75000

## 2011-10-20 ENCOUNTER — Other Ambulatory Visit: Payer: Self-pay

## 2011-10-20 DIAGNOSIS — Z331 Pregnant state, incidental: Secondary | ICD-10-CM

## 2011-10-20 LAB — GLUCOSE, CAPILLARY

## 2011-10-20 NOTE — Progress Notes (Signed)
3 HR GTT DONE TODAY Amber Branch 

## 2011-10-21 LAB — GLUCOSE TOLERANCE, 3 HOURS
Glucose Tolerance, 1 hour: 199 mg/dL — ABNORMAL HIGH (ref 70–189)
Glucose Tolerance, 2 hour: 152 mg/dL (ref 70–164)
Glucose Tolerance, Fasting: 82 mg/dL (ref 70–104)
Glucose, GTT - 3 Hour: 133 mg/dL (ref 70–144)

## 2011-10-27 ENCOUNTER — Ambulatory Visit (INDEPENDENT_AMBULATORY_CARE_PROVIDER_SITE_OTHER): Payer: Self-pay | Admitting: Family Medicine

## 2011-10-27 VITALS — BP 103/70 | Wt 188.0 lb

## 2011-10-27 DIAGNOSIS — Z3480 Encounter for supervision of other normal pregnancy, unspecified trimester: Secondary | ICD-10-CM

## 2011-10-27 DIAGNOSIS — O99810 Abnormal glucose complicating pregnancy: Secondary | ICD-10-CM

## 2011-10-27 DIAGNOSIS — Z3490 Encounter for supervision of normal pregnancy, unspecified, unspecified trimester: Secondary | ICD-10-CM

## 2011-10-27 DIAGNOSIS — O24419 Gestational diabetes mellitus in pregnancy, unspecified control: Secondary | ICD-10-CM | POA: Insufficient documentation

## 2011-10-27 NOTE — Patient Instructions (Addendum)
Fue un Research officer, trade union.  Estoy encaminandole a la clinica obstetrica de [employer] de [facility] [place].

## 2011-10-28 ENCOUNTER — Encounter: Payer: Self-pay | Admitting: Family Medicine

## 2011-10-28 NOTE — Progress Notes (Signed)
Patient completed Pregnancy Medical Home Risk Screening Tool, as well as PHQ9 depression screen.  Her PMH Risk Screening was negative; PHQ9 score=2 (Q#4, "low energy").  Also, OB Urine Cx done at intake shows mult morphotypes 75,000 colonies.  Patient is asymptomatic.  Will defer on recollection at this visit.  JB

## 2011-10-28 NOTE — Progress Notes (Signed)
Visit conducted in Spanish.  Amber Branch is [id number] @[redacted]w[redacted]d  by US done at [facility]; she comes in to establish prenatal care and set up referral to High-Risk OB for history of 3 consecutive SABs.  She did not pass the early 1hGTT, and her 1-hour reading on her 3hrGTT is 199.  She denies history of GDM in prior pregnancies.  Plans to breast and bottle feed.  Has been taking PNVs since before conception.  Denies bleeding or discharge, denies dysuria.   Plan to establish with HROB clinic for management of patient with recurrent losses, now with gluc 199. Paula Compton, MD

## 2011-11-02 ENCOUNTER — Encounter: Payer: Self-pay | Attending: Obstetrics & Gynecology | Admitting: Dietician

## 2011-11-02 ENCOUNTER — Ambulatory Visit (INDEPENDENT_AMBULATORY_CARE_PROVIDER_SITE_OTHER): Payer: Self-pay | Admitting: Family Medicine

## 2011-11-02 DIAGNOSIS — O99810 Abnormal glucose complicating pregnancy: Secondary | ICD-10-CM | POA: Insufficient documentation

## 2011-11-02 DIAGNOSIS — Z713 Dietary counseling and surveillance: Secondary | ICD-10-CM | POA: Insufficient documentation

## 2011-11-02 LAB — POCT URINALYSIS DIP (DEVICE)
Ketones, ur: NEGATIVE mg/dL
Protein, ur: NEGATIVE mg/dL
Specific Gravity, Urine: 1.025 (ref 1.005–1.030)
Urobilinogen, UA: 0.2 mg/dL (ref 0.0–1.0)

## 2011-11-02 NOTE — Progress Notes (Signed)
Diabetes Education:  Completed review of the diet for GDM with the assistance of Gretta Arab the [place] interpreter.  Provided handouts in Spanish 1. Nutrition, Diabetes and Pregnancy, 2. Carbohydrate Counting.  Her husband was present for the session.  Provided a True Track meter kit [id number]  EXP: 2013/10/20.  Glucose fasting today was 104 mg.  Presents with family history of DM2 and no previous history of GDM.  Provided 1 box of strips [id number] Exp: 2013/12/20.   1 box of lancets Lot: [id number] Exp: 2015/08/24.  Maggie Christalynn Boise, RN, RD, CDE

## 2011-11-09 ENCOUNTER — Ambulatory Visit (INDEPENDENT_AMBULATORY_CARE_PROVIDER_SITE_OTHER): Payer: Self-pay | Admitting: Obstetrics & Gynecology

## 2011-11-09 ENCOUNTER — Encounter: Payer: Self-pay | Admitting: Obstetrics & Gynecology

## 2011-11-09 VITALS — BP 105/66 | Temp 97.1°F | Ht 60.0 in | Wt 187.0 lb

## 2011-11-09 DIAGNOSIS — O0990 Supervision of high risk pregnancy, unspecified, unspecified trimester: Secondary | ICD-10-CM

## 2011-11-09 DIAGNOSIS — O24419 Gestational diabetes mellitus in pregnancy, unspecified control: Secondary | ICD-10-CM

## 2011-11-09 DIAGNOSIS — O99810 Abnormal glucose complicating pregnancy: Secondary | ICD-10-CM

## 2011-11-09 LAB — POCT URINALYSIS DIP (DEVICE)
Bilirubin Urine: NEGATIVE
Hgb urine dipstick: NEGATIVE
Nitrite: NEGATIVE
Specific Gravity, Urine: >=1.03 (ref 1.005–1.030)
Urobilinogen, UA: 0.2 mg/dL (ref 0.0–1.0)
pH: 5.5 (ref 5.0–8.0)

## 2011-11-09 MED ORDER — GLYBURIDE 2.5 MG PO TABS: 2.5000 mg | ORAL_TABLET | Freq: Every day | ORAL | Status: DC

## 2011-11-09 NOTE — Progress Notes (Signed)
Gestational diabetes identified by abnormal 3 hr GTT, with  1 hour at 199. FBS 104-129, PP 98-158. Cervical exam external os open, internal possibly fingertip, feels less than 10 weeks, no FHT by dopplers. Repeat US for viability.

## 2011-11-09 NOTE — Progress Notes (Signed)
Nutrition Note: 1st consult Pt has hx of GDM & obesity. Pt has gained 5# @[redacted]w[redacted]d , which is slightly > expected. Pt reports eating 3 meals & 3 snacks/ d. BS: fasting-104-129, 2hr pp-98-158; pt reports a few questions about eating certain foods & BS. Pt reports no N/V. Pt reports taking PNV daily. Disc wt gain goals of 11-20#. Pt given verbal & written education on nutrition during pregnancy & reviewed GDM diet. Answered pt's ?s regarding certain foods she wasn't sure if she could eat (ex soup, beef with hot sauce). Pt agrees to continue to follow GDM diet including 3 meals & 3 snacks with proper CHO/ pro combination. Pt does not receive WIC services for this pregnancy but plans to apply soon. Pt plans to breastfeed. F/u in 4-6 wks Blondell Reveal, MS, RD, LDN

## 2011-11-09 NOTE — Patient Instructions (Signed)
Diabetes mellitus gestacional (Gestational Diabetes Mellitus) La diabetes mellitus gestacional se produce slo durante el embarazo. Aparece cuando el organismo no puede controlar adecuadamente la glucosa (azcar) que aumenta en la sangre despus de comer. Durante el embarazo, se produce una resistencia a la insulina (sensibilidad reducida a la insulina) debido a la liberacin de hormonas por parte de la placenta. Generalmente, el pncreas de una mujer embarazada produce la cantidad suficiente de insulina para vencer esa resistencia. Sin embargo, en la diabetes gestacional, hay insulina pero no cumple su funcin adecuadamente. Si la resistencia es lo suficientemente grave como para que el pncreas no produzca la cantidad de insulina suficiente, la glucosa extra se acumula en la sangre.  QUINES TIENEN RIESGO DE DESARROLLAR DIABETES GESTACIONAL?  Las mujeres con historia de diabetes en la familia.   Las mujeres de ms de 25 aos.   Las que presentan sobrepeso.   Las mujeres que pertenecen a ciertos grupos tnicos (latinas, afroamericanas, norteamericanas nativas, asiticas y las originarias de las islas del [employer].  QUE PUEDE OCURRIRLE AL BEB? Si el nivel de glucosa en sangre de la madre es demasiado elevado mientras este embarazada, el nivel extra de azcar pasar por el cordn umbilical hacia el beb. Algunos de los problemas del beb pueden ser:  Beb demasiado grande: si el nio recibe demasiada azcar, puede aumentar mucho de peso. Esto puede hacer que sea demasiado grande para nacer por parto normal (vaginal) por lo que ser necesario realizar una cesrea.   Bajo nivel de glucosa (hipoglucemia): el beb produce insulina extra en respuesta a la excesiva cantidad de azcar que obtiene de la madre. Cuando el beb nace y ya no necesita insulina extra, su nivel de azcar en sangre puede disminuir.   Ictericia (coloracin amarillenta de la piel y los ojos): esto es bastante frecuente en los  bebs. La causa es la acumulacin de una sustancia qumica denominada bilirrubina. No siempre es un trastorno grave, pero se observa con frecuencia en los bebs cuyas madres sufren diabetes gestacional.  RIESGOS PARA LA MADRE Las mujeres que han sufrido diabetes gestacional pueden tener ms riesgos para algunos problemas como:  Preeclampsia o toxemia, incluyendo problemas con hipertensin arterial. La presin arterial y los niveles de protenas en la orina deben controlarse con frecuencia.   Infecciones   Parto por cesrea.   Aparicin de diabetes tipo 2 en una etapa posterior de la vida. Alrededor del 30% al 50% sufrir diabetes posteriormente, especialmente las que son obesas.  DIAGNSTICO Las hormonas que causan resistencia a la insulina tienen su mayor nivel alrededor de las 24 a 28 semanas del embarazo. Si se experimentan sntomas, stos son similares a los sntomas que normalmente aparecen durante el embarazo.  La diabetes mellitus gestacional generalmente se diagnostica por medio de un mtodo en dos partes: 1. Despus de la 24 a 28 semanas de embarazo, la mujer debe beber una solucin que contiene glucosa y realizar un anlisis de sangre. Si el nivel de glucosa es elevado, la realizarn un segundo anlisis.  2. La prueba oral de tolerancia a la glucosa, que dura aproximadamente tres horas. Despus de realizar ayuno durante la noche, se controla nivel de glucosa en sangre. La mujer bebe una solucin que contiene glucosa y le realizan anlisis de glucosa en sangre cada hora.  Si la mujer tiene factores de riesgos para la diabetes mellitus gestacional, el mdico podr indicar el anlisis antes de las 24 semanas de embarazo. TRATAMIENTO El tratamiento est dirigido a mantener la glucosa en   sangre de la madre en un nivel normal y puede incluir:  La planificacin de los alimentos.   Recibir insulina u otro medicamento para controlar el nivel de glucosa en sangre.   La prctica de ejercicios.    Llevar un registro diario de los alimentos que consume.   Control y registro de los niveles de glucosa en sangre.   Control de los niveles de cetona en la orina, aunque esto ya no se considera necesario en la mayora de los embarazos.  INSTRUCCIONES PARA EL CUIDADO DOMICILIARIO Mientras est embarazada:  Siga los consejos de su mdico relacionados con los controles prenatales, la planificacin de la comida, la actividad fsica, los medicamentos, vitaminas, los anlisis de sangre y otras pruebas y las actividades fsicas.   Lleve un registro de las comidas, las pruebas de glucosa en sangre y la cantidad de insulina que recibe (si corresponde). Muestre todo al profesional en cada consulta mdica prenatal.   Si sufre diabetes mellitus gestacional, podr tener problemas de hipoglucemia (nivel bajo de glucosa en sangre). Podr sospechar este problema si se siente repentinamente mareada, tiene temblores y/o se siente dbil. Si cree que esto le est ocurriendo, y tiene un medidor de glucosa, mida su nivel de glucosa en sangre. Siga los consejos de su mdico sobre el modo y el momento de tratar su nivel de glucosa en sangre. Generalmente se sigue la regla 15:15 Consuma 15 g de hidratos de carbono, espere 15 minutos y vuelva controlar el nivel de glucosa en sangre.. Ejemplos de 15 g de hidratos de carbono son:   1 taza de leche descremada.    taza de jugo.   3-4 tabletas de glucosa.   5-6 caramelos duros.   1 caja pequea de pasas de uva.    taza de gaseosa comn.   Mantenga una buena higiene para evitar infecciones.   No fume.  SOLICITE ATENCIN MDICA SI:  Observa prdida vaginal con o sin picazn.   Se siente ms dbil o cansada que lo habitual.   Transpira mucho.   Tiene un aumento de peso repentino, 2,5 kg o ms en una semana.   Pierde peso, 1.5 kg o ms en una semana.   Su nivel de glucosa en sangre es elevado, necesita instrucciones.  SOLICITE ATENCIN MDICA DE  INMEDIATO SI:  Sufre una cefalea intensa.   Se marea o pierde el conocimiento   Presenta nuseas o vmitos.   Se siente desorientada confundida.   Sufre convulsiones.   Tiene problemas de visin.   Siente dolor en el estmago.   Presenta una hemorragia vaginal abundante.   Tiene contracciones uterinas.   Tiene una prdida importante de lquido por la vagina  DESPUS QUE NACE EL BEB:  Concurra a todos los controles de seguimiento y realice los anlisis de sangre segn las indicaciones de su mdico.   Mantenga un estilo de vida saludable para evitar la diabetes en el futuro. Aqu se incluye:   Siga el plan de alimentacin saludable.   Controle su peso.   Practique actividad fsica y descanse lo necesario.   No fume.   Amamante a su beb mientras pueda. Esto disminuir la probabilidad de que usted y su beb sufran diabetes posteriormente.  Para ms informacin acerca de la diabetes, visite la pgina web de la American Diabetes Association: www.americandiabetesassociation.org. Para ms informacin acerca de la diabetes gestacional cite la pgina web del American Congress of Obst[employer]ians [employer]necologists en: www.acog.org. Document Released: 12/17/2004 Document Revised: 02/26/2011 [employer] Patient Information 2012   [employer]. 

## 2011-11-09 NOTE — Progress Notes (Signed)
Pulse: 85

## 2011-11-16 ENCOUNTER — Ambulatory Visit (HOSPITAL_COMMUNITY)
Admission: RE | Admit: 2011-11-16 | Discharge: 2011-11-16 | Disposition: A | Discharge status: Home / Self Care | Payer: Self-pay | Source: Ambulatory Visit | Attending: Physician Assistant | Admitting: Physician Assistant

## 2011-11-16 ENCOUNTER — Ambulatory Visit (INDEPENDENT_AMBULATORY_CARE_PROVIDER_SITE_OTHER): Payer: Self-pay | Admitting: Physician Assistant

## 2011-11-16 VITALS — BP 103/68 | Temp 98.5°F | Wt 186.0 lb

## 2011-11-16 DIAGNOSIS — O368390 Maternal care for abnormalities of the fetal heart rate or rhythm, unspecified trimester, not applicable or unspecified: Secondary | ICD-10-CM | POA: Insufficient documentation

## 2011-11-16 DIAGNOSIS — O3680X0 Pregnancy with inconclusive fetal viability, not applicable or unspecified: Secondary | ICD-10-CM | POA: Insufficient documentation

## 2011-11-16 DIAGNOSIS — O24419 Gestational diabetes mellitus in pregnancy, unspecified control: Secondary | ICD-10-CM

## 2011-11-16 DIAGNOSIS — O0990 Supervision of high risk pregnancy, unspecified, unspecified trimester: Secondary | ICD-10-CM

## 2011-11-16 DIAGNOSIS — O99810 Abnormal glucose complicating pregnancy: Secondary | ICD-10-CM | POA: Insufficient documentation

## 2011-11-16 LAB — POCT URINALYSIS DIP (DEVICE)
Bilirubin Urine: NEGATIVE
Hgb urine dipstick: NEGATIVE
Protein, ur: NEGATIVE mg/dL
pH: 6 (ref 5.0–8.0)

## 2011-11-16 MED ORDER — GLYBURIDE 5 MG PO TABS: 5.0000 mg | ORAL_TABLET | Freq: Every day | ORAL | Status: DC

## 2011-11-16 NOTE — Progress Notes (Signed)
FBS: improved but all still >100, 2pp: <120. Unable to hear heart tones sent to [place] for viability. Will increase glyburide to 5mg  at hs. Will scheduled Optho and ECHO at next visit after viability confirmed

## 2011-11-16 NOTE — Progress Notes (Signed)
Pulse 93 Patient voices no complaints

## 2011-11-16 NOTE — Patient Instructions (Addendum)
Diabetes mellitus gestacional (Gestational Diabetes Mellitus) La diabetes mellitus gestacional se produce slo durante el embarazo. Aparece cuando el organismo no puede controlar adecuadamente la glucosa (azcar) que aumenta en la sangre despus de comer. Durante el embarazo, se produce una resistencia a la insulina (sensibilidad reducida a la insulina) debido a la liberacin de hormonas por parte de la placenta. Generalmente, el pncreas de una mujer embarazada produce la cantidad suficiente de insulina para vencer esa resistencia. Sin embargo, en la diabetes gestacional, hay insulina pero no cumple su funcin adecuadamente. Si la resistencia es lo suficientemente grave como para que el pncreas no produzca la cantidad de insulina suficiente, la glucosa extra se acumula en la sangre.  QUINES TIENEN RIESGO DE DESARROLLAR DIABETES GESTACIONAL?  Las mujeres con historia de diabetes en la familia.   Las mujeres de ms de 25 aos.   Las que presentan sobrepeso.   Las mujeres que pertenecen a ciertos grupos tnicos (latinas, afroamericanas, norteamericanas nativas, asiticas y las originarias de las islas del [employer].  QUE PUEDE OCURRIRLE AL BEB? Si el nivel de glucosa en sangre de la madre es demasiado elevado mientras este embarazada, el nivel extra de azcar pasar por el cordn umbilical hacia el beb. Algunos de los problemas del beb pueden ser:  Beb demasiado grande: si el nio recibe demasiada azcar, puede aumentar mucho de peso. Esto puede hacer que sea demasiado grande para nacer por parto normal (vaginal) por lo que ser necesario realizar una cesrea.   Bajo nivel de glucosa (hipoglucemia): el beb produce insulina extra en respuesta a la excesiva cantidad de azcar que obtiene de la madre. Cuando el beb nace y ya no necesita insulina extra, su nivel de azcar en sangre puede disminuir.   Ictericia (coloracin amarillenta de la piel y los ojos): esto es bastante frecuente en los  bebs. La causa es la acumulacin de una sustancia qumica denominada bilirrubina. No siempre es un trastorno grave, pero se observa con frecuencia en los bebs cuyas madres sufren diabetes gestacional.  RIESGOS PARA LA MADRE Las mujeres que han sufrido diabetes gestacional pueden tener ms riesgos para algunos problemas como:  Preeclampsia o toxemia, incluyendo problemas con hipertensin arterial. La presin arterial y los niveles de protenas en la orina deben controlarse con frecuencia.   Infecciones   Parto por cesrea.   Aparicin de diabetes tipo 2 en una etapa posterior de la vida. Alrededor del 30% al 50% sufrir diabetes posteriormente, especialmente las que son obesas.  DIAGNSTICO Las hormonas que causan resistencia a la insulina tienen su mayor nivel alrededor de las 24 a 28 semanas del embarazo. Si se experimentan sntomas, stos son similares a los sntomas que normalmente aparecen durante el embarazo.  La diabetes mellitus gestacional generalmente se diagnostica por medio de un mtodo en dos partes: 1. Despus de la 24 a 28 semanas de embarazo, la mujer debe beber una solucin que contiene glucosa y realizar un anlisis de sangre. Si el nivel de glucosa es elevado, la realizarn un segundo anlisis.  2. La prueba oral de tolerancia a la glucosa, que dura aproximadamente tres horas. Despus de realizar ayuno durante la noche, se controla nivel de glucosa en sangre. La mujer bebe una solucin que contiene glucosa y le realizan anlisis de glucosa en sangre cada hora.  Si la mujer tiene factores de riesgos para la diabetes mellitus gestacional, el mdico podr indicar el anlisis antes de las 24 semanas de embarazo. TRATAMIENTO El tratamiento est dirigido a mantener la glucosa en   sangre de la madre en un nivel normal y puede incluir:  La planificacin de los alimentos.   Recibir insulina u otro medicamento para controlar el nivel de glucosa en sangre.   La prctica de ejercicios.    Llevar un registro diario de los alimentos que consume.   Control y registro de los niveles de glucosa en sangre.   Control de los niveles de cetona en la orina, aunque esto ya no se considera necesario en la mayora de los embarazos.  INSTRUCCIONES PARA EL CUIDADO DOMICILIARIO Mientras est embarazada:  Siga los consejos de su mdico relacionados con los controles prenatales, la planificacin de la comida, la actividad fsica, los medicamentos, vitaminas, los anlisis de sangre y otras pruebas y las actividades fsicas.   Lleve un registro de las comidas, las pruebas de glucosa en sangre y la cantidad de insulina que recibe (si corresponde). Muestre todo al profesional en cada consulta mdica prenatal.   Si sufre diabetes mellitus gestacional, podr tener problemas de hipoglucemia (nivel bajo de glucosa en sangre). Podr sospechar este problema si se siente repentinamente mareada, tiene temblores y/o se siente dbil. Si cree que esto le est ocurriendo, y tiene un medidor de glucosa, mida su nivel de glucosa en sangre. Siga los consejos de su mdico sobre el modo y el momento de tratar su nivel de glucosa en sangre. Generalmente se sigue la regla 15:15 Consuma 15 g de hidratos de carbono, espere 15 minutos y vuelva controlar el nivel de glucosa en sangre.. Ejemplos de 15 g de hidratos de carbono son:   1 taza de leche descremada.    taza de jugo.   3-4 tabletas de glucosa.   5-6 caramelos duros.   1 caja pequea de pasas de uva.    taza de gaseosa comn.   Mantenga una buena higiene para evitar infecciones.   No fume.  SOLICITE ATENCIN MDICA SI:  Observa prdida vaginal con o sin picazn.   Se siente ms dbil o cansada que lo habitual.   Transpira mucho.   Tiene un aumento de peso repentino, 2,5 kg o ms en una semana.   Pierde peso, 1.5 kg o ms en una semana.   Su nivel de glucosa en sangre es elevado, necesita instrucciones.  SOLICITE ATENCIN MDICA DE  INMEDIATO SI:  Sufre una cefalea intensa.   Se marea o pierde el conocimiento   Presenta nuseas o vmitos.   Se siente desorientada confundida.   Sufre convulsiones.   Tiene problemas de visin.   Siente dolor en el estmago.   Presenta una hemorragia vaginal abundante.   Tiene contracciones uterinas.   Tiene una prdida importante de lquido por la vagina  DESPUS QUE NACE EL BEB:  Concurra a todos los controles de seguimiento y realice los anlisis de sangre segn las indicaciones de su mdico.   Mantenga un estilo de vida saludable para evitar la diabetes en el futuro. Aqu se incluye:   Siga el plan de alimentacin saludable.   Controle su peso.   Practique actividad fsica y descanse lo necesario.   No fume.   Amamante a su beb mientras pueda. Esto disminuir la probabilidad de que usted y su beb sufran diabetes posteriormente.  Para ms informacin acerca de la diabetes, visite la pgina web de la American Diabetes Association: www.americandiabetesassociation.org. Para ms informacin acerca de la diabetes gestacional cite la pgina web del American Congress of Obstetricians and Gynecologists en: www.acog.org. Document Released: 12/17/2004 Document Revised: 02/26/2011 [employer] Patient Information 2012   [employer]. 

## 2011-12-07 ENCOUNTER — Ambulatory Visit (INDEPENDENT_AMBULATORY_CARE_PROVIDER_SITE_OTHER): Payer: Self-pay | Admitting: Advanced Practice Midwife

## 2011-12-07 VITALS — BP 106/70 | Temp 97.0°F | Wt 187.6 lb

## 2011-12-07 DIAGNOSIS — O99810 Abnormal glucose complicating pregnancy: Secondary | ICD-10-CM

## 2011-12-07 DIAGNOSIS — O0990 Supervision of high risk pregnancy, unspecified, unspecified trimester: Secondary | ICD-10-CM

## 2011-12-07 DIAGNOSIS — O09899 Supervision of other high risk pregnancies, unspecified trimester: Secondary | ICD-10-CM

## 2011-12-07 DIAGNOSIS — O24419 Gestational diabetes mellitus in pregnancy, unspecified control: Secondary | ICD-10-CM

## 2011-12-07 LAB — POCT URINALYSIS DIP (DEVICE)
Hgb urine dipstick: NEGATIVE
Ketones, ur: NEGATIVE mg/dL
Protein, ur: NEGATIVE mg/dL
Specific Gravity, Urine: 1.02 (ref 1.005–1.030)
Urobilinogen, UA: 0.2 mg/dL (ref 0.0–1.0)
pH: 7 (ref 5.0–8.0)

## 2011-12-07 NOTE — Progress Notes (Signed)
P-118 

## 2011-12-07 NOTE — Progress Notes (Signed)
Fasting CBGs 90-100. 2 hour PC <120. Switched back to Glyburide 2.5 mg at HS due to feeling sick and jittery on 5 mg. Discussed w/ Dr. Debroah Loop. OK to leave on 2.3 mg for now. Anatomy US scheduled. Will schedule fetal Echo, opthalmology appt. Baseline 24 hour urine at [place].

## 2011-12-21 ENCOUNTER — Ambulatory Visit (HOSPITAL_COMMUNITY)
Admission: RE | Admit: 2011-12-21 | Discharge: 2011-12-21 | Disposition: A | Discharge status: Home / Self Care | Payer: Self-pay | Source: Ambulatory Visit | Attending: Advanced Practice Midwife | Admitting: Advanced Practice Midwife

## 2011-12-21 ENCOUNTER — Ambulatory Visit (INDEPENDENT_AMBULATORY_CARE_PROVIDER_SITE_OTHER): Payer: Self-pay | Admitting: Obstetrics & Gynecology

## 2011-12-21 VITALS — BP 95/58 | Temp 97.1°F | Wt 187.0 lb

## 2011-12-21 DIAGNOSIS — O0990 Supervision of high risk pregnancy, unspecified, unspecified trimester: Secondary | ICD-10-CM

## 2011-12-21 DIAGNOSIS — O99810 Abnormal glucose complicating pregnancy: Secondary | ICD-10-CM | POA: Insufficient documentation

## 2011-12-21 DIAGNOSIS — O09899 Supervision of other high risk pregnancies, unspecified trimester: Secondary | ICD-10-CM

## 2011-12-21 DIAGNOSIS — O2620 Pregnancy care for patient with recurrent pregnancy loss, unspecified trimester: Secondary | ICD-10-CM | POA: Insufficient documentation

## 2011-12-21 DIAGNOSIS — Z23 Encounter for immunization: Secondary | ICD-10-CM

## 2011-12-21 DIAGNOSIS — O24419 Gestational diabetes mellitus in pregnancy, unspecified control: Secondary | ICD-10-CM

## 2011-12-21 DIAGNOSIS — E669 Obesity, unspecified: Secondary | ICD-10-CM | POA: Insufficient documentation

## 2011-12-21 DIAGNOSIS — O358XX0 Maternal care for other (suspected) fetal abnormality and damage, not applicable or unspecified: Secondary | ICD-10-CM | POA: Insufficient documentation

## 2011-12-21 LAB — POCT URINALYSIS DIP (DEVICE)
Glucose, UA: NEGATIVE mg/dL
Hgb urine dipstick: NEGATIVE
Nitrite: NEGATIVE
Protein, ur: NEGATIVE mg/dL
Specific Gravity, Urine: 1.015 (ref 1.005–1.030)
Urobilinogen, UA: 1 mg/dL (ref 0.0–1.0)
pH: 7 (ref 5.0–8.0)

## 2011-12-21 MED ORDER — INFLUENZA VIRUS VACC SPLIT PF IM SUSP
0.5000 mL | Freq: Once | INTRAMUSCULAR | Status: AC
  Administered 2011-12-21: 0.5 mL via INTRAMUSCULAR

## 2011-12-21 NOTE — Progress Notes (Signed)
[facility] appointment scheduled February 05, 2012 at 845 am. Fetal Echo with Dr. Elizebeth Brooking scheduled January 04, 2012 at 1 pm. Tel.# for financial services given.

## 2011-12-21 NOTE — Progress Notes (Signed)
P-58 

## 2011-12-21 NOTE — Patient Instructions (Signed)
Diabetes mellitus gestacional (Gestational Diabetes Mellitus) La diabetes mellitus gestacional se produce slo durante el embarazo. Aparece cuando el organismo no puede controlar adecuadamente la glucosa (azcar) que aumenta en la sangre despus de comer. Durante el embarazo, se produce una resistencia a la insulina (sensibilidad reducida a la insulina) debido a la liberacin de hormonas por parte de la placenta. Generalmente, el pncreas de una mujer embarazada produce la cantidad suficiente de insulina para vencer esa resistencia. Sin embargo, en la diabetes gestacional, hay insulina pero no cumple su funcin adecuadamente. Si la resistencia es lo suficientemente grave como para que el pncreas no produzca la cantidad de insulina suficiente, la glucosa extra se acumula en la sangre.  QUINES TIENEN RIESGO DE DESARROLLAR DIABETES GESTACIONAL?  Las mujeres con historia de diabetes en la familia.   Las mujeres de ms de 25 aos.   Las que presentan sobrepeso.   Las mujeres que pertenecen a ciertos grupos tnicos (latinas, afroamericanas, norteamericanas nativas, asiticas y las originarias de las islas del [employer].  QUE PUEDE OCURRIRLE AL BEB? Si el nivel de glucosa en sangre de la madre es demasiado elevado mientras este embarazada, el nivel extra de azcar pasar por el cordn umbilical hacia el beb. Algunos de los problemas del beb pueden ser:  Beb demasiado grande: si el nio recibe demasiada azcar, puede aumentar mucho de peso. Esto puede hacer que sea demasiado grande para nacer por parto normal (vaginal) por lo que ser necesario realizar una cesrea.   Bajo nivel de glucosa (hipoglucemia): el beb produce insulina extra en respuesta a la excesiva cantidad de azcar que obtiene de la madre. Cuando el beb nace y ya no necesita insulina extra, su nivel de azcar en sangre puede disminuir.   Ictericia (coloracin amarillenta de la piel y los ojos): esto es bastante frecuente en los  bebs. La causa es la acumulacin de una sustancia qumica denominada bilirrubina. No siempre es un trastorno grave, pero se observa con frecuencia en los bebs cuyas madres sufren diabetes gestacional.  RIESGOS PARA LA MADRE Las mujeres que han sufrido diabetes gestacional pueden tener ms riesgos para algunos problemas como:  Preeclampsia o toxemia, incluyendo problemas con hipertensin arterial. La presin arterial y los niveles de protenas en la orina deben controlarse con frecuencia.   Infecciones   Parto por cesrea.   Aparicin de diabetes tipo 2 en una etapa posterior de la vida. Alrededor del 30% al 50% sufrir diabetes posteriormente, especialmente las que son obesas.  DIAGNSTICO Las hormonas que causan resistencia a la insulina tienen su mayor nivel alrededor de las 24 a 28 semanas del embarazo. Si se experimentan sntomas, stos son similares a los sntomas que normalmente aparecen durante el embarazo.  La diabetes mellitus gestacional generalmente se diagnostica por medio de un mtodo en dos partes: 1. Despus de la 24 a 28 semanas de embarazo, la mujer debe beber una solucin que contiene glucosa y realizar un anlisis de sangre. Si el nivel de glucosa es elevado, la realizarn un segundo anlisis.  2. La prueba oral de tolerancia a la glucosa, que dura aproximadamente tres horas. Despus de realizar ayuno durante la noche, se controla nivel de glucosa en sangre. La mujer bebe una solucin que contiene glucosa y le realizan anlisis de glucosa en sangre cada hora.  Si la mujer tiene factores de riesgos para la diabetes mellitus gestacional, el mdico podr indicar el anlisis antes de las 24 semanas de embarazo. TRATAMIENTO El tratamiento est dirigido a mantener la glucosa en   sangre de la madre en un nivel normal y puede incluir:  La planificacin de los alimentos.   Recibir insulina u otro medicamento para controlar el nivel de glucosa en sangre.   La prctica de ejercicios.    Llevar un registro diario de los alimentos que consume.   Control y registro de los niveles de glucosa en sangre.   Control de los niveles de cetona en la orina, aunque esto ya no se considera necesario en la mayora de los embarazos.  INSTRUCCIONES PARA EL CUIDADO DOMICILIARIO Mientras est embarazada:  Siga los consejos de su mdico relacionados con los controles prenatales, la planificacin de la comida, la actividad fsica, los medicamentos, vitaminas, los anlisis de sangre y otras pruebas y las actividades fsicas.   Lleve un registro de las comidas, las pruebas de glucosa en sangre y la cantidad de insulina que recibe (si corresponde). Muestre todo al profesional en cada consulta mdica prenatal.   Si sufre diabetes mellitus gestacional, podr tener problemas de hipoglucemia (nivel bajo de glucosa en sangre). Podr sospechar este problema si se siente repentinamente mareada, tiene temblores y/o se siente dbil. Si cree que esto le est ocurriendo, y tiene un medidor de glucosa, mida su nivel de glucosa en sangre. Siga los consejos de su mdico sobre el modo y el momento de tratar su nivel de glucosa en sangre. Generalmente se sigue la regla 15:15 Consuma 15 g de hidratos de carbono, espere 15 minutos y vuelva controlar el nivel de glucosa en sangre.. Ejemplos de 15 g de hidratos de carbono son:   1 taza de leche descremada.    taza de jugo.   3-4 tabletas de glucosa.   5-6 caramelos duros.   1 caja pequea de pasas de uva.    taza de gaseosa comn.   Mantenga una buena higiene para evitar infecciones.   No fume.  SOLICITE ATENCIN MDICA SI:  Observa prdida vaginal con o sin picazn.   Se siente ms dbil o cansada que lo habitual.   Transpira mucho.   Tiene un aumento de peso repentino, 2,5 kg o ms en una semana.   Pierde peso, 1.5 kg o ms en una semana.   Su nivel de glucosa en sangre es elevado, necesita instrucciones.  SOLICITE ATENCIN MDICA DE  INMEDIATO SI:  Sufre una cefalea intensa.   Se marea o pierde el conocimiento   Presenta nuseas o vmitos.   Se siente desorientada confundida.   Sufre convulsiones.   Tiene problemas de visin.   Siente dolor en el estmago.   Presenta una hemorragia vaginal abundante.   Tiene contracciones uterinas.   Tiene una prdida importante de lquido por la vagina  DESPUS QUE NACE EL BEB:  Concurra a todos los controles de seguimiento y realice los anlisis de sangre segn las indicaciones de su mdico.   Mantenga un estilo de vida saludable para evitar la diabetes en el futuro. Aqu se incluye:   Siga el plan de alimentacin saludable.   Controle su peso.   Practique actividad fsica y descanse lo necesario.   No fume.   Amamante a su beb mientras pueda. Esto disminuir la probabilidad de que usted y su beb sufran diabetes posteriormente.  Para ms informacin acerca de la diabetes, visite la pgina web de la American Diabetes Association: www.americandiabetesassociation.org. Para ms informacin acerca de la diabetes gestacional cite la pgina web del American Congress of Obst[employer]ians [employer]necologists en: www.acog.org. Document Released: 12/17/2004 Document Revised: 02/26/2011 [employer] Patient Information 2012   [employer]. 

## 2011-12-21 NOTE — Progress Notes (Signed)
Will schedule fetal Echo, opthalmology appt Forgot her BS log, needs testing supplies

## 2012-01-04 ENCOUNTER — Other Ambulatory Visit (HOSPITAL_COMMUNITY): Payer: Self-pay

## 2012-01-04 ENCOUNTER — Ambulatory Visit (INDEPENDENT_AMBULATORY_CARE_PROVIDER_SITE_OTHER): Payer: Self-pay | Admitting: Family Medicine

## 2012-01-04 ENCOUNTER — Ambulatory Visit (HOSPITAL_COMMUNITY)
Admission: RE | Admit: 2012-01-04 | Discharge: 2012-01-04 | Disposition: A | Discharge status: Home / Self Care | Payer: Self-pay | Source: Ambulatory Visit | Attending: Family Medicine | Admitting: Family Medicine

## 2012-01-04 VITALS — BP 115/68 | Temp 97.3°F | Wt 187.9 lb

## 2012-01-04 DIAGNOSIS — O26899 Other specified pregnancy related conditions, unspecified trimester: Secondary | ICD-10-CM

## 2012-01-04 DIAGNOSIS — N949 Unspecified condition associated with female genital organs and menstrual cycle: Secondary | ICD-10-CM

## 2012-01-04 DIAGNOSIS — Z3689 Encounter for other specified antenatal screening: Secondary | ICD-10-CM | POA: Insufficient documentation

## 2012-01-04 DIAGNOSIS — O24419 Gestational diabetes mellitus in pregnancy, unspecified control: Secondary | ICD-10-CM

## 2012-01-04 DIAGNOSIS — O99810 Abnormal glucose complicating pregnancy: Secondary | ICD-10-CM

## 2012-01-04 DIAGNOSIS — O9989 Other specified diseases and conditions complicating pregnancy, childbirth and the puerperium: Secondary | ICD-10-CM

## 2012-01-04 LAB — POCT URINALYSIS DIP (DEVICE)
Glucose, UA: NEGATIVE mg/dL
Hgb urine dipstick: NEGATIVE
Nitrite: NEGATIVE
Protein, ur: NEGATIVE mg/dL
Specific Gravity, Urine: 1.025 (ref 1.005–1.030)
Urobilinogen, UA: 0.2 mg/dL (ref 0.0–1.0)

## 2012-01-04 NOTE — Progress Notes (Signed)
P = 90 Pain and pressure in lower abdomen and lower back with ambulation

## 2012-01-04 NOTE — Progress Notes (Signed)
Patient to go to [facility] after Fetal Echo at 1pm with Dr. Elizebeth Brooking. Patient will be worked into the schedule.

## 2012-01-04 NOTE — Patient Instructions (Signed)
Embarazo - Segundo trimestre (Pregnancy - Second Trimester) El segundo trimestre del embarazo (del 3 al 6mes) es un perodo de evolucin rpida para usted y el beb. Hacia el final del sexto mes, el beb mide aproximadamente 23 cm y pesa 680 g. Comenzar a sentir los movimientos del beb entre las 18 y las 20 semanas de embarazo. Podr sentir las pataditas ("quickening en ingls"). Hay un rpido aumento de peso. Puede segregar un lquido claro (calostro) de las mamas. Quizs sienta pequeas contracciones en el vientre (tero) Esto se conoce como falso trabajo de parto o contracciones de Braxton-Hicks. Es como una prctica del trabajo de parto que se produce cuando el beb est listo para salir. Generalmente los problemas de vmitos matinales ya se han superado hacia el final del primer trimestre. Algunas mujeres desarrollan pequeas manchas oscuras (que se denominan cloasma, mscara del embarazo) en la cara que normalmente se van luego del nacimiento del beb. La exposicin al sol empeora las manchas. Puede desarrollarse acn en algunas mujeres embarazadas, y puede desaparecer en aquellas que ya tienen acn. EXAMENES PRENATALES  Durante los exmenes prenatales, deber seguir realizando pruebas de sangre, segn avance el embarazo. Estas pruebas se realizan para controlar su salud y la del beb. Tambin se realizan anlisis de sangre para conocer los niveles de hemoglobina. La anemia (bajo nivel de hemoglobina) es frecuente durante el embarazo. Para prevenirla, se administran hierro y vitaminas. Tambin se le realizarn exmenes para saber si tiene diabetes entre las 24 y las 28 semanas del embarazo. Podrn repetirle algunas de las pruebas que le hicieron previamente.  En cada visita le medirn el tamao del tero. Esto se realiza para asegurarse de que el beb est creciendo correctamente de acuerdo al estado del embarazo.  Tambin en cada visita prenatal controlarn su presin arterial. Esto se realiza  para asegurarse de que no tenga toxemia.  Se controlar su orina para asegurarse de que no tenga infecciones, diabetes o protena en la orina.  Se controlar su peso regularmente para asegurarse que el aumento ocurre al ritmo indicado. Esto se hace para asegurarse que usted y el beb tienen una evolucin normal.  En algunas ocasiones se realiza una prueba de ultrasonido para confirmar el correcto desarrollo y evolucin del beb. Esta prueba se realiza con ondas sonoras inofensivas para el beb, de modo que el profesional pueda calcular ms precisamente la fecha del parto. Algunas veces se realizan pruebas especializadas del lquido amnitico que rodea al beb. Esta prueba se denomina amniocentesis. El lquido amnitico se obtiene introduciendo una aguja en el vientre (abdomen). Se realiza para controlar los cromosomas en aquellos casos en los que existe alguna preocupacin acerca de algn problema gentico que pueda sufrir el beb. En ocasiones se lleva a cabo cerca del final del embarazo, si es necesario inducir al parto. En este caso se realiza para asegurarse que los pulmones del beb estn lo suficientemente maduros como para que pueda vivir fuera del tero. CAMBIOS QUE OCURREN EN EL SEGUNDO TRIMESTRE DEL EMBARAZO Su organismo atravesar numerosos cambios durante el embarazo. Estos pueden variar de una persona a otra. Converse con el profesional que la asiste acerca los cambios que usted note y que la preocupen.  Durante el segundo trimestre probablemente sienta un aumento del apetito. Es normal tener "antojos" de ciertas comidas. Esto vara de una persona a otra y de un embarazo a otro.  El abdomen inferior comenzar a abultarse.  Podr tener la necesidad de orinar con ms frecuencia debido a que   el tero y el beb presionan sobre la vejiga. Tambin es frecuente contraer ms infecciones urinarias durante el embarazo (dolor al orinar). Puede evitarlas bebiendo gran cantidad de lquidos y vaciando  la vejiga antes y despus de mantener relaciones sexuales.  Podrn aparecer las primeras estras en las caderas, abdomen y mamas. Estos son cambios normales del cuerpo durante el embarazo. No existen medicamentos ni ejercicios que puedan prevenir estos cambios.  Es posible que comience a desarrollar venas inflamadas y abultadas (varices) en las piernas. El uso de medias de descanso, elevar sus pies durante 15 minutos, 3 a 4 veces al da y limitar la sal en su dieta ayuda a aliviar el problema.  Podr sentir acidez gstrica a medida que el tero crece y presiona contra el estmago. Puede tomar anticidos, con la autorizacin de su mdico, para aliviar este problema. Tambin es til ingerir pequeas comidas 4 a 5 veces al da.  La constipacin puede tratarse con un laxante o agregando fibra a su dieta. Beber grandes cantidades de lquidos, comer vegetales, frutas y granos integrales es de gran ayuda.  Tambin es beneficioso practicar actividad fsica. Si ha sido una persona activa hasta el embarazo, podr continuar con la mayora de las actividades durante el mismo. Si ha sido menos activa, puede ser beneficioso que comience con un programa de ejercicios, como realizar caminatas.  Puede desarrollar hemorroides (vrices en el recto) hacia el final del segundo trimestre. Tomar baos de asiento tibios y utilizar cremas recomendadas por el profesional que lo asiste sern de ayuda para los problemas de hemorroides.  Tambin podr sentir dolor de espalda durante este momento de su embarazo. Evite levantar objetos pesados, utilice zapatos de taco bajo y mantenga una buena postura para ayudar a reducir los problemas de espalda.  Algunas mujeres embarazadas desarrollan hormigueo y adormecimiento de la mano y los dedos debido a la hinchazn y compresin de los ligamentos de la mueca (sndrome del tnel carpiano). Esto desaparece una vez que el beb nace.  Como sus pechos se agrandan, necesitar un sujetador  ms grande. Use un sostn de soporte, cmodo y de algodn. No utilice un sostn para amamantar hasta el ltimo mes de embarazo si va a amamantar al beb.  Podr observar una lnea oscura desde el ombligo hacia la zona pbica denominada linea nigra.  Podr observar que sus mejillas se ponen coloradas debido al aumento de flujo sanguneo en la cara.  Podr desarrollar "araitas" en la cara, cuello y pecho. Esto desaparece una vez que el beb nace. INSTRUCCIONES PARA EL CUIDADO DOMICILIARIO  Es extremadamente importante que evite el cigarrillo, hierbas medicinales, alcohol y las drogas no prescriptas durante el embarazo. Estas sustancias qumicas afectan la formacin y el desarrollo del beb. Evite estas sustancias durante todo el embarazo para asegurar el nacimiento de un beb sano.  La mayor parte de los cuidados que se aconsejan son los mismos que los indicados para el primer trimestre del embarazo. Cumpla con las citas tal como se le indic. Siga las instrucciones del profesional que lo asiste con respecto al uso de los medicamentos, el ejercicio y la dieta.  Durante el embarazo debe obtener nutrientes para usted y para su beb. Consuma alimentos balanceados a intervalos regulares. Elija alimentos como carne, pescado, leche y otros productos lcteos descremados, vegetales, frutas, panes integrales y cereales. El profesional le informar cul es el aumento de peso ideal.  Las relaciones sexuales fsicas pueden continuarse hasta cerca del fin del embarazo si no existen otros problemas. Estos   problemas pueden ser la prdida temprana (prematura) de lquido amnitico de las membranas, sangrado vaginal, dolor abdominal u otros problemas mdicos o del embarazo.  Realice actividad fsica todos los das, si no tiene restricciones. Consulte con el profesional que la asiste si no sabe con certeza si determinados ejercicios son seguros. El mayor aumento de peso tiene lugar durante los ltimos 2 trimestres del  embarazo. El ejercicio la ayudar a:  Controlar su peso.  Ponerla en forma para el parto.  Ayudarla a perder peso luego de haber dado a luz.  Use un buen sostn o como los que se usan para hacer deportes para aliviar la sensibilidad de las mamas. Tambin puede serle til si lo usa mientras duerme. Si pierde calostro, podr utilizar apsitos en el sostn.  No utilice la baera con agua caliente, baos turcos y saunas durante el embarazo.  Utilice el cinturn de seguridad sin excepcin cuando conduzca. Este la proteger a usted y al beb en caso de accidente.  Evite comer carne cruda, queso crudo, y el contacto con los utensilios y desperdicios de los gatos. Estos elementos contienen grmenes que pueden causar defectos de nacimiento en el beb.  El segundo trimestre es un buen momento para visitar a su dentista y evaluar su salud dental si an no lo ha hecho. Es importante mantener los dientes limpios. Utilice un cepillo de dientes blando. Cepllese ms suavemente durante el embarazo.  Es ms fcil perder algo de orina durante el embarazo. Apretar y fortalecer los msculos de la pelvis la ayudar con este problema. Practique detener la miccin cuando est en el bao. Estos son los mismos msculos que necesita fortalecer. Son tambin los mismos msculos que utiliza cuando trata de evitar los gases. Puede practicar apretando estos msculos 10 veces, y repetir esto 3 veces por da aproximadamente. Una vez que conozca qu msculos debe apretar, no realice estos ejercicios durante la miccin. Puede favorecerle una infeccin si la orina vuelve hacia atrs.  Pida ayuda si tiene necesidades econmicas, de asesoramiento o nutricionales durante el embarazo. El profesional podr ayudarla con respecto a estas necesidades, o derivarla a otros especialistas.  La piel puede ponerse grasa. Si esto sucede, lvese la cara con un jabn suave, utilice un humectante no graso y maquillaje con base de aceite o  crema. CONSUMO DE MEDICAMENTOS Y DROGAS DURANTE EL EMBARAZO  Contine tomando las vitaminas apropiadas para esta etapa tal como se le indic. Las vitaminas deben contener un miligramo de cido flico y deben suplementarse con hierro. Guarde todas las vitaminas fuera del alcance de los nios. La ingestin de slo un par de vitaminas o tabletas que contengan hierro puede ocasionar la muerte en un beb o en un nio pequeo.  Evite el uso de medicamentos, inclusive los de venta libre y hierbas que no hayan sido prescriptos o indicados por el profesional que la asiste. Algunos medicamentos pueden causar problemas fsicos al beb. Utilice los medicamentos de venta libre o de prescripcin para el dolor, el malestar o la fiebre, segn se lo indique el profesional que lo asiste. No utilice aspirina.  El consumo de alcohol est relacionado con ciertos defectos de nacimiento. Esto incluye el sndrome de alcoholismo fetal. Debe evitar el consumo de alcohol en cualquiera de sus formas. El cigarrillo causa nacimientos prematuros y bebs de bajo peso. El uso de drogas recreativas est absolutamente prohibido. Son muy nocivas para el beb. Un beb que nace de una madre adicta, ser adicto al nacer. Ese beb tendr los mismos   sntomas de abstinencia que un adulto.  Infrmele al profesional si consume alguna droga.  No consuma drogas ilegales. Pueden causarle mucho dao al beb. SOLICITE ATENCIN MDICA SI: Tiene preguntas o preocupaciones durante su embarazo. Es mejor que llame para consultar las dudas que esperar hasta su prxima visita prenatal. De esta forma se sentir ms tranquila.  SOLICITE ATENCIN MDICA DE INMEDIATO SI:  La temperatura oral se eleva sin motivo por encima de 102 F (38.9 C) o segn le indique el profesional que lo asiste.  Tiene una prdida de lquido por la vagina (canal de parto). Si sospecha una ruptura de las membranas, tmese la temperatura y llame al profesional para informarlo sobre  esto.  Observa unas pequeas manchas, una hemorragia vaginal o elimina cogulos. Notifique al profesional acerca de la cantidad y de cuntos apsitos est utilizando. Unas pequeas manchas de sangre son algo comn durante el embarazo, especialmente despus de mantener relaciones sexuales.  Presenta un olor desagradable en la secrecin vaginal y observa un cambio en el color, de transparente a blanco.  Contina con las nuseas y no obtiene alivio de los remedios indicados. Vomita sangre o algo similar a la borra del caf.  [place] o sube ms de 900 g. en una semana, o segn lo indicado por el profesional que la asiste.  Observa que se le hinchan el rostro, las manos, los pies o las piernas.  Ha estado expuesta a la rubola y no ha sufrido la enfermedad.  Ha estado expuesta a la quinta enfermedad o a la varicela.  Presenta dolor abdominal. Las molestias en el ligamento redondo son una causa no cancerosa (benigna) frecuente de dolor abdominal durante el embarazo. El profesional que la asiste deber evaluarla.  Presenta dolor de cabeza intenso que no se alivia.  Presenta fiebre, diarrea, dolor al orinar o le falta la respiracin.  Presenta dificultad para ver, visin borrosa, o visin doble.  Sufre una cada, un accidente de trnsito o cualquier tipo de trauma.  Vive en un hogar en el que existe violencia fsica o mental. Document Released: 12/17/2004 Document Revised: 06/01/2011 [employer] Patient Information 2013 [employer]. Diabetes mellitus gestacional (Gestational Diabetes Mellitus) La diabetes mellitus gestacional se produce slo durante el embarazo. Aparece cuando el organismo no puede controlar adecuadamente la glucosa (azcar) que aumenta en la sangre despus de comer. Durante el embarazo, se produce una resistencia a la insulina (sensibilidad reducida a la insulina) debido a la liberacin de hormonas por parte de la placenta. Generalmente, el pncreas de una mujer embarazada  produce la cantidad suficiente de insulina para vencer esa resistencia. Sin embargo, en la diabetes gestacional, hay insulina pero no cumple su funcin adecuadamente. Si la resistencia es lo suficientemente grave como para que el pncreas no produzca la cantidad de insulina suficiente, la glucosa extra se acumula en la sangre.  QUINES TIENEN RIESGO DE DESARROLLAR DIABETES GESTACIONAL?  Las mujeres con historia de diabetes en la familia.  Las mujeres de ms de 25 aos.  Las que presentan sobrepeso.  Las mujeres que pertenecen a ciertos grupos tnicos (latinas, afroamericanas, norteamericanas nativas, asiticas y las originarias de las islas del [employer]. QUE PUEDE OCURRIRLE AL BEB? Si el nivel de glucosa en sangre de la madre es demasiado elevado mientras este embarazada, el nivel extra de azcar pasar por el cordn umbilical hacia el beb. Algunos de los problemas del beb pueden ser:  Beb demasiado grande: si el nio recibe demasiada azcar, puede aumentar mucho de peso. Esto puede hacer que sea   demasiado grande para nacer por parto normal (vaginal) por lo que ser necesario realizar una cesrea.  Bajo nivel de glucosa (hipoglucemia): el beb produce insulina extra en respuesta a la excesiva cantidad de azcar que obtiene de la madre. Cuando el beb nace y ya no necesita insulina extra, su nivel de azcar en sangre puede disminuir.  Ictericia (coloracin amarillenta de la piel y los ojos): esto es bastante frecuente en los bebs. La causa es la acumulacin de una sustancia qumica denominada bilirrubina. No siempre es un trastorno grave, pero se observa con frecuencia en los bebs cuyas madres sufren diabetes gestacional. RIESGOS PARA LA MADRE Las mujeres que han sufrido diabetes gestacional pueden tener ms riesgos para algunos problemas como:  Preeclampsia o toxemia, incluyendo problemas con hipertensin arterial. La presin arterial y los niveles de protenas en la orina deben  controlarse con frecuencia.  Infecciones  Parto por cesrea.  Aparicin de diabetes tipo 2 en una etapa posterior de la vida. Alrededor del 30% al 50% sufrir diabetes posteriormente, especialmente las que son obesas. DIAGNSTICO Las hormonas que causan resistencia a la insulina tienen su mayor nivel alrededor de las 24 a 28 semanas del embarazo. Si se experimentan sntomas, stos son similares a los sntomas que normalmente aparecen durante el embarazo.  La diabetes mellitus gestacional generalmente se diagnostica por medio de un mtodo en dos partes: 1. Despus de la 24 a 28 semanas de embarazo, la mujer debe beber una solucin que contiene glucosa y realizar un anlisis de sangre. Si el nivel de glucosa es elevado, la realizarn un segundo anlisis. 2. La prueba oral de tolerancia a la glucosa, que dura aproximadamente tres horas. Despus de realizar ayuno durante la noche, se controla nivel de glucosa en sangre. La mujer bebe una solucin que contiene glucosa y le realizan anlisis de glucosa en sangre cada hora. Si la mujer tiene factores de riesgos para la diabetes mellitus gestacional, el mdico podr indicar el anlisis antes de las 24 semanas de embarazo. TRATAMIENTO El tratamiento est dirigido a mantener la glucosa en sangre de la madre en un nivel normal y puede incluir:  La planificacin de los alimentos.  Recibir insulina u otro medicamento para controlar el nivel de glucosa en sangre.  La prctica de ejercicios.  Llevar un registro diario de los alimentos que consume.  Control y registro de los niveles de glucosa en sangre.  Control de los niveles de cetona en la orina, aunque esto ya no se considera necesario en la mayora de los embarazos. INSTRUCCIONES PARA EL CUIDADO DOMICILIARIO Mientras est embarazada:  Siga los consejos de su mdico relacionados con los controles prenatales, la planificacin de la comida, la actividad fsica, los medicamentos, vitaminas, los  anlisis de sangre y otras pruebas y las actividades fsicas.  Lleve un registro de las comidas, las pruebas de glucosa en sangre y la cantidad de insulina que recibe (si corresponde). Muestre todo al profesional en cada consulta mdica prenatal.  Si sufre diabetes mellitus gestacional, podr tener problemas de hipoglucemia (nivel bajo de glucosa en sangre). Podr sospechar este problema si se siente repentinamente mareada, tiene temblores y/o se siente dbil. Si cree que esto le est ocurriendo, y tiene un medidor de glucosa, mida su nivel de glucosa en sangre. Siga los consejos de su mdico sobre el modo y el momento de tratar su nivel de glucosa en sangre. Generalmente se sigue la regla 15:15 Consuma 15 g de hidratos de carbono, espere 15 minutos y vuelva controlar el nivel   de glucosa en sangre.. Ejemplos de 15 g de hidratos de carbono son:  1 taza de leche descremada.   taza de jugo.  3-4 tabletas de glucosa.  5-6 caramelos duros.  1 caja pequea de pasas de uva.   taza de gaseosa comn.  Mantenga una buena higiene para evitar infecciones.  No fume. SOLICITE ATENCIN MDICA SI:  Observa prdida vaginal con o sin picazn.  Se siente ms dbil o cansada que lo habitual.  Transpira mucho.  Tiene un aumento de peso repentino, 2,5 kg o ms en una semana.  Pierde peso, 1.5 kg o ms en una semana.  Su nivel de glucosa en sangre es elevado, necesita instrucciones. SOLICITE ATENCIN MDICA DE INMEDIATO SI:  Sufre una cefalea intensa.  Se marea o pierde el conocimiento  Presenta nuseas o vmitos.  Se siente desorientada confundida.  Sufre convulsiones.  Tiene problemas de visin.  Siente dolor en el estmago.  Presenta una hemorragia vaginal abundante.  Tiene contracciones uterinas.  Tiene una prdida importante de lquido por la vagina DESPUS QUE NACE EL BEB:  Concurra a todos los controles de seguimiento y realice los anlisis de sangre segn las indicaciones  de su mdico.  Mantenga un estilo de vida saludable para evitar la diabetes en el futuro. Aqu se incluye:  Siga el plan de alimentacin saludable.  Controle su peso.  Practique actividad fsica y descanse lo necesario.  No fume.  Amamante a su beb mientras pueda. Esto disminuir la probabilidad de que usted y su beb sufran diabetes posteriormente. Para ms informacin acerca de la diabetes, visite la pgina web de la American Diabetes Association: www.americandiabetesassociation.org. Para ms informacin acerca de la diabetes gestacional cite la pgina web del American Congress of [employer]ians [employer]nec[employer] en: www.acog.org. Document Released: 12/17/2004 Document Revised: 06/01/2011 [employer] Patient Information 2013 [employer].  

## 2012-01-04 NOTE — Progress Notes (Signed)
-    DM:  On glyburide 2.5 mg a day at breakfast. Fasting:  80s-low 90s. PP 90s to 110s, high of 120 on 2 occasions. Continue current regimen. Has fetal echo today. Fetal survey normal 9/30. BP normal, no proteinuria. Has ophtho appt 11/15. -  Feeling a lot of pelvic/vaginal pressure for past 2 weeks. Very uncomfortable. No contractions, bleeding or loss of fluid. Cervical length 3.1 cm on 9/30 sono (transabominal). Pressure is worse when walking for long time but also when sitting for long time.  Cervix close but feels about 50% length today. Will get TV sono for cervical length. No hx preterm delivery.

## 2012-01-18 ENCOUNTER — Encounter: Payer: Self-pay | Attending: Obstetrics & Gynecology | Admitting: Dietician

## 2012-01-18 ENCOUNTER — Ambulatory Visit (INDEPENDENT_AMBULATORY_CARE_PROVIDER_SITE_OTHER): Payer: Self-pay | Admitting: Advanced Practice Midwife

## 2012-01-18 VITALS — BP 102/65 | Temp 97.0°F | Wt 185.0 lb

## 2012-01-18 DIAGNOSIS — Z713 Dietary counseling and surveillance: Secondary | ICD-10-CM | POA: Insufficient documentation

## 2012-01-18 DIAGNOSIS — O24419 Gestational diabetes mellitus in pregnancy, unspecified control: Secondary | ICD-10-CM

## 2012-01-18 DIAGNOSIS — O0990 Supervision of high risk pregnancy, unspecified, unspecified trimester: Secondary | ICD-10-CM

## 2012-01-18 DIAGNOSIS — O99810 Abnormal glucose complicating pregnancy: Secondary | ICD-10-CM | POA: Insufficient documentation

## 2012-01-18 LAB — POCT URINALYSIS DIP (DEVICE)
Hgb urine dipstick: NEGATIVE
Nitrite: NEGATIVE
Protein, ur: NEGATIVE mg/dL
Urobilinogen, UA: 0.2 mg/dL (ref 0.0–1.0)
pH: 6 (ref 5.0–8.0)

## 2012-01-18 MED ORDER — LANSOPRAZOLE 15 MG PO CPDR: 15.0000 mg | DELAYED_RELEASE_CAPSULE | Freq: Every day | ORAL | Status: DC

## 2012-01-18 NOTE — Progress Notes (Signed)
FBS 87,90,91,95,88,91,94    B-117,118,119,106,120,121   L- [id number],119,120,119    D-117,98,107,[id number] Feels well except for some pressure. Cx length 3.4 at last [place].  Dr Elizebeth Brooking said [place] was normal on echo. Appt for Optho in Nov.

## 2012-01-18 NOTE — Progress Notes (Signed)
Interpreter- Trinna Post.  Pulse 79. No pain, pressure in pelvic.

## 2012-01-18 NOTE — Addendum Note (Signed)
Addended by: Aviva Signs on: 01/18/2012 12:02 PM   Modules accepted: Orders

## 2012-01-18 NOTE — Patient Instructions (Signed)
Embarazo - Segundo trimestre (Pregnancy - Second Trimester) El segundo trimestre del embarazo (del 3 al 6mes) es un perodo de evolucin rpida para usted y el beb. Hacia el final del sexto mes, el beb mide aproximadamente 23 cm y pesa 680 g. Comenzar a sentir los movimientos del beb entre las 18 y las 20 semanas de embarazo. Podr sentir las pataditas ("quickening en ingls"). Hay un rpido aumento de peso. Puede segregar un lquido claro (calostro) de las mamas. Quizs sienta pequeas contracciones en el vientre (tero) Esto se conoce como falso trabajo de parto o contracciones de Braxton-Hicks. Es como una prctica del trabajo de parto que se produce cuando el beb est listo para salir. Generalmente los problemas de vmitos matinales ya se han superado hacia el final del primer trimestre. Algunas mujeres desarrollan pequeas manchas oscuras (que se denominan cloasma, mscara del embarazo) en la cara que normalmente se van luego del nacimiento del beb. La exposicin al sol empeora las manchas. Puede desarrollarse acn en algunas mujeres embarazadas, y puede desaparecer en aquellas que ya tienen acn. EXAMENES PRENATALES  Durante los exmenes prenatales, deber seguir realizando pruebas de sangre, segn avance el embarazo. Estas pruebas se realizan para controlar su salud y la del beb. Tambin se realizan anlisis de sangre para conocer los niveles de hemoglobina. La anemia (bajo nivel de hemoglobina) es frecuente durante el embarazo. Para prevenirla, se administran hierro y vitaminas. Tambin se le realizarn exmenes para saber si tiene diabetes entre las 24 y las 28 semanas del embarazo. Podrn repetirle algunas de las pruebas que le hicieron previamente.  En cada visita le medirn el tamao del tero. Esto se realiza para asegurarse de que el beb est creciendo correctamente de acuerdo al estado del embarazo.  Tambin en cada visita prenatal controlarn su presin arterial. Esto se realiza  para asegurarse de que no tenga toxemia.  Se controlar su orina para asegurarse de que no tenga infecciones, diabetes o protena en la orina.  Se controlar su peso regularmente para asegurarse que el aumento ocurre al ritmo indicado. Esto se hace para asegurarse que usted y el beb tienen una evolucin normal.  En algunas ocasiones se realiza una prueba de ultrasonido para confirmar el correcto desarrollo y evolucin del beb. Esta prueba se realiza con ondas sonoras inofensivas para el beb, de modo que el profesional pueda calcular ms precisamente la fecha del parto. Algunas veces se realizan pruebas especializadas del lquido amnitico que rodea al beb. Esta prueba se denomina amniocentesis. El lquido amnitico se obtiene introduciendo una aguja en el vientre (abdomen). Se realiza para controlar los cromosomas en aquellos casos en los que existe alguna preocupacin acerca de algn problema gentico que pueda sufrir el beb. En ocasiones se lleva a cabo cerca del final del embarazo, si es necesario inducir al parto. En este caso se realiza para asegurarse que los pulmones del beb estn lo suficientemente maduros como para que pueda vivir fuera del tero. CAMBIOS QUE OCURREN EN EL SEGUNDO TRIMESTRE DEL EMBARAZO Su organismo atravesar numerosos cambios durante el embarazo. Estos pueden variar de una persona a otra. Converse con el profesional que la asiste acerca los cambios que usted note y que la preocupen.  Durante el segundo trimestre probablemente sienta un aumento del apetito. Es normal tener "antojos" de ciertas comidas. Esto vara de una persona a otra y de un embarazo a otro.  El abdomen inferior comenzar a abultarse.  Podr tener la necesidad de orinar con ms frecuencia debido a que   el tero y el beb presionan sobre la vejiga. Tambin es frecuente contraer ms infecciones urinarias durante el embarazo (dolor al orinar). Puede evitarlas bebiendo gran cantidad de lquidos y vaciando  la vejiga antes y despus de mantener relaciones sexuales.  Podrn aparecer las primeras estras en las caderas, abdomen y mamas. Estos son cambios normales del cuerpo durante el embarazo. No existen medicamentos ni ejercicios que puedan prevenir estos cambios.  Es posible que comience a desarrollar venas inflamadas y abultadas (varices) en las piernas. El uso de medias de descanso, elevar sus pies durante 15 minutos, 3 a 4 veces al da y limitar la sal en su dieta ayuda a aliviar el problema.  Podr sentir acidez gstrica a medida que el tero crece y presiona contra el estmago. Puede tomar anticidos, con la autorizacin de su mdico, para aliviar este problema. Tambin es til ingerir pequeas comidas 4 a 5 veces al da.  La constipacin puede tratarse con un laxante o agregando fibra a su dieta. Beber grandes cantidades de lquidos, comer vegetales, frutas y granos integrales es de gran ayuda.  Tambin es beneficioso practicar actividad fsica. Si ha sido una persona activa hasta el embarazo, podr continuar con la mayora de las actividades durante el mismo. Si ha sido menos activa, puede ser beneficioso que comience con un programa de ejercicios, como realizar caminatas.  Puede desarrollar hemorroides (vrices en el recto) hacia el final del segundo trimestre. Tomar baos de asiento tibios y utilizar cremas recomendadas por el profesional que lo asiste sern de ayuda para los problemas de hemorroides.  Tambin podr sentir dolor de espalda durante este momento de su embarazo. Evite levantar objetos pesados, utilice zapatos de taco bajo y mantenga una buena postura para ayudar a reducir los problemas de espalda.  Algunas mujeres embarazadas desarrollan hormigueo y adormecimiento de la mano y los dedos debido a la hinchazn y compresin de los ligamentos de la mueca (sndrome del tnel carpiano). Esto desaparece una vez que el beb nace.  Como sus pechos se agrandan, necesitar un sujetador  ms grande. Use un sostn de soporte, cmodo y de algodn. No utilice un sostn para amamantar hasta el ltimo mes de embarazo si va a amamantar al beb.  Podr observar una lnea oscura desde el ombligo hacia la zona pbica denominada linea nigra.  Podr observar que sus mejillas se ponen coloradas debido al aumento de flujo sanguneo en la cara.  Podr desarrollar "araitas" en la cara, cuello y pecho. Esto desaparece una vez que el beb nace. INSTRUCCIONES PARA EL CUIDADO DOMICILIARIO  Es extremadamente importante que evite el cigarrillo, hierbas medicinales, alcohol y las drogas no prescriptas durante el embarazo. Estas sustancias qumicas afectan la formacin y el desarrollo del beb. Evite estas sustancias durante todo el embarazo para asegurar el nacimiento de un beb sano.  La mayor parte de los cuidados que se aconsejan son los mismos que los indicados para el primer trimestre del embarazo. Cumpla con las citas tal como se le indic. Siga las instrucciones del profesional que lo asiste con respecto al uso de los medicamentos, el ejercicio y la dieta.  Durante el embarazo debe obtener nutrientes para usted y para su beb. Consuma alimentos balanceados a intervalos regulares. Elija alimentos como carne, pescado, leche y otros productos lcteos descremados, vegetales, frutas, panes integrales y cereales. El profesional le informar cul es el aumento de peso ideal.  Las relaciones sexuales fsicas pueden continuarse hasta cerca del fin del embarazo si no existen otros problemas. Estos   problemas pueden ser la prdida temprana (prematura) de lquido amnitico de las membranas, sangrado vaginal, dolor abdominal u otros problemas mdicos o del embarazo.  Realice actividad fsica todos los das, si no tiene restricciones. Consulte con el profesional que la asiste si no sabe con certeza si determinados ejercicios son seguros. El mayor aumento de peso tiene lugar durante los ltimos 2 trimestres del  embarazo. El ejercicio la ayudar a:  Controlar su peso.  Ponerla en forma para el parto.  Ayudarla a perder peso luego de haber dado a luz.  Use un buen sostn o como los que se usan para hacer deportes para aliviar la sensibilidad de las mamas. Tambin puede serle til si lo usa mientras duerme. Si pierde calostro, podr utilizar apsitos en el sostn.  No utilice la baera con agua caliente, baos turcos y saunas durante el embarazo.  Utilice el cinturn de seguridad sin excepcin cuando conduzca. Este la proteger a usted y al beb en caso de accidente.  Evite comer carne cruda, queso crudo, y el contacto con los utensilios y desperdicios de los gatos. Estos elementos contienen grmenes que pueden causar defectos de nacimiento en el beb.  El segundo trimestre es un buen momento para visitar a su dentista y evaluar su salud dental si an no lo ha hecho. Es importante mantener los dientes limpios. Utilice un cepillo de dientes blando. Cepllese ms suavemente durante el embarazo.  Es ms fcil perder algo de orina durante el embarazo. Apretar y fortalecer los msculos de la pelvis la ayudar con este problema. Practique detener la miccin cuando est en el bao. Estos son los mismos msculos que necesita fortalecer. Son tambin los mismos msculos que utiliza cuando trata de evitar los gases. Puede practicar apretando estos msculos 10 veces, y repetir esto 3 veces por da aproximadamente. Una vez que conozca qu msculos debe apretar, no realice estos ejercicios durante la miccin. Puede favorecerle una infeccin si la orina vuelve hacia atrs.  Pida ayuda si tiene necesidades econmicas, de asesoramiento o nutricionales durante el embarazo. El profesional podr ayudarla con respecto a estas necesidades, o derivarla a otros especialistas.  La piel puede ponerse grasa. Si esto sucede, lvese la cara con un jabn suave, utilice un humectante no graso y maquillaje con base de aceite o  crema. CONSUMO DE MEDICAMENTOS Y DROGAS DURANTE EL EMBARAZO  Contine tomando las vitaminas apropiadas para esta etapa tal como se le indic. Las vitaminas deben contener un miligramo de cido flico y deben suplementarse con hierro. Guarde todas las vitaminas fuera del alcance de los nios. La ingestin de slo un par de vitaminas o tabletas que contengan hierro puede ocasionar la muerte en un beb o en un nio pequeo.  Evite el uso de medicamentos, inclusive los de venta libre y hierbas que no hayan sido prescriptos o indicados por el profesional que la asiste. Algunos medicamentos pueden causar problemas fsicos al beb. Utilice los medicamentos de venta libre o de prescripcin para el dolor, el malestar o la fiebre, segn se lo indique el profesional que lo asiste. No utilice aspirina.  El consumo de alcohol est relacionado con ciertos defectos de nacimiento. Esto incluye el sndrome de alcoholismo fetal. Debe evitar el consumo de alcohol en cualquiera de sus formas. El cigarrillo causa nacimientos prematuros y bebs de bajo peso. El uso de drogas recreativas est absolutamente prohibido. Son muy nocivas para el beb. Un beb que nace de una madre adicta, ser adicto al nacer. Ese beb tendr los mismos   sntomas de abstinencia que un adulto.  Infrmele al profesional si consume alguna droga.  No consuma drogas ilegales. Pueden causarle mucho dao al beb. SOLICITE ATENCIN MDICA SI: Tiene preguntas o preocupaciones durante su embarazo. Es mejor que llame para consultar las dudas que esperar hasta su prxima visita prenatal. De esta forma se sentir ms tranquila.  SOLICITE ATENCIN MDICA DE INMEDIATO SI:  La temperatura oral se eleva sin motivo por encima de 102 F (38.9 C) o segn le indique el profesional que lo asiste.  Tiene una prdida de lquido por la vagina (canal de parto). Si sospecha una ruptura de las membranas, tmese la temperatura y llame al profesional para informarlo sobre  esto.  Observa unas pequeas manchas, una hemorragia vaginal o elimina cogulos. Notifique al profesional acerca de la cantidad y de cuntos apsitos est utilizando. Unas pequeas manchas de sangre son algo comn durante el embarazo, especialmente despus de mantener relaciones sexuales.  Presenta un olor desagradable en la secrecin vaginal y observa un cambio en el color, de transparente a blanco.  Contina con las nuseas y no obtiene alivio de los remedios indicados. Vomita sangre o algo similar a la borra del caf.  [place] o sube ms de 900 g. en una semana, o segn lo indicado por el profesional que la asiste.  Observa que se le hinchan el rostro, las manos, los pies o las piernas.  Ha estado expuesta a la rubola y no ha sufrido la enfermedad.  Ha estado expuesta a la quinta enfermedad o a la varicela.  Presenta dolor abdominal. Las molestias en el ligamento redondo son una causa no cancerosa (benigna) frecuente de dolor abdominal durante el embarazo. El profesional que la asiste deber evaluarla.  Presenta dolor de cabeza intenso que no se alivia.  Presenta fiebre, diarrea, dolor al orinar o le falta la respiracin.  Presenta dificultad para ver, visin borrosa, o visin doble.  Sufre una cada, un accidente de trnsito o cualquier tipo de trauma.  Vive en un hogar en el que existe violencia fsica o mental. Document Released: 12/17/2004 Document Revised: 06/01/2011 [employer] Patient Information 2013 [employer].  

## 2012-02-01 ENCOUNTER — Ambulatory Visit (INDEPENDENT_AMBULATORY_CARE_PROVIDER_SITE_OTHER): Payer: Self-pay | Admitting: Advanced Practice Midwife

## 2012-02-01 ENCOUNTER — Encounter: Payer: Self-pay | Attending: Obstetrics & Gynecology | Admitting: Dietician

## 2012-02-01 DIAGNOSIS — O99810 Abnormal glucose complicating pregnancy: Secondary | ICD-10-CM

## 2012-02-01 DIAGNOSIS — Z713 Dietary counseling and surveillance: Secondary | ICD-10-CM | POA: Insufficient documentation

## 2012-02-01 LAB — POCT URINALYSIS DIP (DEVICE)
Nitrite: NEGATIVE
Protein, ur: NEGATIVE mg/dL
Urobilinogen, UA: 0.2 mg/dL (ref 0.0–1.0)
pH: 7 (ref 5.0–8.0)

## 2012-02-01 NOTE — Progress Notes (Signed)
Doing well.  Good fetal movement, denies vaginal bleeding, LOF, regular contractions.  Blood sugar log: F: 87-92; B: 104-120; L:89-120;  D:98-120.

## 2012-02-01 NOTE — Progress Notes (Signed)
Pulse: 86

## 2012-02-01 NOTE — Progress Notes (Signed)
Diabetes Education.  Provided 1 box of True Tack Strips, XBJ:[id number] Exp: 2014/02/19 and Lancets: Lot: [id number]   EXP:  2015/08/24.  Maggie Melitta Tigue, RN, RD, CDE

## 2012-02-15 ENCOUNTER — Ambulatory Visit (INDEPENDENT_AMBULATORY_CARE_PROVIDER_SITE_OTHER): Payer: Self-pay | Admitting: Family Medicine

## 2012-02-15 ENCOUNTER — Encounter: Payer: Self-pay | Admitting: Obstetrics and Gynecology

## 2012-02-15 VITALS — BP 109/63 | Temp 96.7°F | Wt 191.2 lb

## 2012-02-15 DIAGNOSIS — Z23 Encounter for immunization: Secondary | ICD-10-CM

## 2012-02-15 DIAGNOSIS — O24919 Unspecified diabetes mellitus in pregnancy, unspecified trimester: Secondary | ICD-10-CM

## 2012-02-15 DIAGNOSIS — O9989 Other specified diseases and conditions complicating pregnancy, childbirth and the puerperium: Secondary | ICD-10-CM

## 2012-02-15 DIAGNOSIS — N898 Other specified noninflammatory disorders of vagina: Secondary | ICD-10-CM

## 2012-02-15 DIAGNOSIS — O26899 Other specified pregnancy related conditions, unspecified trimester: Secondary | ICD-10-CM

## 2012-02-15 LAB — POCT URINALYSIS DIP (DEVICE)
Glucose, UA: NEGATIVE mg/dL
Nitrite: NEGATIVE
Protein, ur: NEGATIVE mg/dL
Specific Gravity, Urine: >=1.03 (ref 1.005–1.030)
Urobilinogen, UA: 0.2 mg/dL (ref 0.0–1.0)

## 2012-02-15 MED ORDER — TETANUS-DIPHTH-ACELL PERTUSSIS 5-2.5-18.5 LF-MCG/0.5 IM SUSP
0.5000 mL | Freq: Once | INTRAMUSCULAR | Status: AC
Start: 2012-02-15 — End: 2012-02-15
  Administered 2012-02-15: 0.5 mL via INTRAMUSCULAR

## 2012-02-15 NOTE — Progress Notes (Signed)
Pulse 92 Has been having a lot of pelvic pain and pressure x3days. When she walks a lot she notices a large amount of discharge.

## 2012-02-15 NOTE — Progress Notes (Signed)
U/S scheduled 02/22/12 at 1015 am. Fetal Echo results requested. ML on VM for [facility] to fax the results from her eye exam to [facility].

## 2012-02-15 NOTE — Progress Notes (Signed)
No BS log today-states couldn't find it. Needs U/s for growth, check A1C today with other 28 wk labs labs. Reporting d/c and pressure-- will check wet prep

## 2012-02-15 NOTE — Patient Instructions (Signed)
Diabetes mellitus gestacional (Gestational Diabetes Mellitus) La diabetes mellitus gestacional se produce slo durante el embarazo. Aparece cuando el organismo no puede controlar adecuadamente la glucosa (azcar) que aumenta en la sangre despus de comer. Durante el embarazo, se produce una resistencia a la insulina (sensibilidad reducida a la insulina) debido a la liberacin de hormonas por parte de la placenta. Generalmente, el pncreas de una mujer embarazada produce la cantidad suficiente de insulina para vencer esa resistencia. Sin embargo, en la diabetes gestacional, hay insulina pero no cumple su funcin adecuadamente. Si la resistencia es lo suficientemente grave como para que el pncreas no produzca la cantidad de insulina suficiente, la glucosa extra se acumula en la sangre.  QUINES TIENEN RIESGO DE DESARROLLAR DIABETES GESTACIONAL?  Las mujeres con historia de diabetes en la familia.  Las mujeres de ms de 25 aos.  Las que presentan sobrepeso.  Las mujeres que pertenecen a ciertos grupos tnicos (latinas, afroamericanas, norteamericanas nativas, asiticas y las originarias de las islas del Pac[employer]. QUE PUEDE OCURRIRLE AL BEB? Si el nivel de glucosa en sangre de la madre es demasiado elevado mientras este embarazada, el nivel extra de azcar pasar por el cordn umbilical hacia el beb. Algunos de los problemas del beb pueden ser:  Beb demasiado grande: si el nio recibe demasiada azcar, puede aumentar mucho de peso. Esto puede hacer que sea demasiado grande para nacer por parto normal (vaginal) por lo que ser necesario realizar una cesrea.  Bajo nivel de glucosa (hipoglucemia): el beb produce insulina extra en respuesta a la excesiva cantidad de azcar que obtiene de la madre. Cuando el beb nace y ya no necesita insulina extra, su nivel de azcar en sangre puede disminuir.  Ictericia (coloracin amarillenta de la piel y los ojos): esto es bastante frecuente en los bebs. La  causa es la acumulacin de una sustancia qumica denominada bilirrubina. No siempre es un trastorno grave, pero se observa con frecuencia en los bebs cuyas madres sufren diabetes gestacional. RIESGOS PARA LA MADRE Las mujeres que han sufrido diabetes gestacional pueden tener ms riesgos para algunos problemas como:  Preeclampsia o toxemia, incluyendo problemas con hipertensin arterial. La presin arterial y los niveles de protenas en la orina deben controlarse con frecuencia.  Infecciones  Parto por cesrea.  Aparicin de diabetes tipo 2 en una etapa posterior de la vida. Alrededor del 30% al 50% sufrir diabetes posteriormente, especialmente las que son obesas. DIAGNSTICO Las hormonas que causan resistencia a la insulina tienen su mayor nivel alrededor de las 24 a 28 semanas del embarazo. Si se experimentan sntomas, stos son similares a los sntomas que normalmente aparecen durante el embarazo.  La diabetes mellitus gestacional generalmente se diagnostica por medio de un mtodo en dos partes: 1. Despus de la 24 a 28 semanas de embarazo, la mujer debe beber una solucin que contiene glucosa y realizar un anlisis de sangre. Si el nivel de glucosa es elevado, la realizarn un segundo anlisis. 2. La prueba oral de tolerancia a la glucosa, que dura aproximadamente tres horas. Despus de realizar ayuno durante la noche, se controla nivel de glucosa en sangre. La mujer bebe una solucin que contiene glucosa y le realizan anlisis de glucosa en sangre cada hora. Si la mujer tiene factores de riesgos para la diabetes mellitus gestacional, el mdico podr indicar el anlisis antes de las 24 semanas de embarazo. TRATAMIENTO El tratamiento est dirigido a mantener la glucosa en sangre de la madre en un nivel normal y puede incluir:  La   planificacin de los alimentos.  Recibir insulina u otro medicamento para controlar el nivel de glucosa en sangre.  La prctica de ejercicios.  Llevar un  registro diario de los alimentos que consume.  Control y registro de los niveles de glucosa en sangre.  Control de los niveles de cetona en la orina, aunque esto ya no se considera necesario en la mayora de los embarazos. INSTRUCCIONES PARA EL CUIDADO DOMICILIARIO Mientras est embarazada:  Siga los consejos de su mdico relacionados con los controles prenatales, la planificacin de la comida, la actividad fsica, los medicamentos, vitaminas, los anlisis de sangre y otras pruebas y las actividades fsicas.  Lleve un registro de las comidas, las pruebas de glucosa en sangre y la cantidad de insulina que recibe (si corresponde). Muestre todo al profesional en cada consulta mdica prenatal.  Si sufre diabetes mellitus gestacional, podr tener problemas de hipoglucemia (nivel bajo de glucosa en sangre). Podr sospechar este problema si se siente repentinamente mareada, tiene temblores y/o se siente dbil. Si cree que esto le est ocurriendo, y tiene un medidor de glucosa, mida su nivel de glucosa en sangre. Siga los consejos de su mdico sobre el modo y el momento de tratar su nivel de glucosa en sangre. Generalmente se sigue la regla 15:15 Consuma 15 g de hidratos de carbono, espere 15 minutos y vuelva controlar el nivel de glucosa en sangre.. Ejemplos de 15 g de hidratos de carbono son:  1 taza de leche descremada.   taza de jugo.  3-4 tabletas de glucosa.  5-6 caramelos duros.  1 caja pequea de pasas de uva.   taza de gaseosa comn.  Mantenga una buena higiene para evitar infecciones.  No fume. SOLICITE ATENCIN MDICA SI:  Observa prdida vaginal con o sin picazn.  Se siente ms dbil o cansada que lo habitual.  Transpira mucho.  Tiene un aumento de peso repentino, 2,5 kg o ms en una semana.  Pierde peso, 1.5 kg o ms en una semana.  Su nivel de glucosa en sangre es elevado, necesita instrucciones. SOLICITE ATENCIN MDICA DE INMEDIATO SI:  Sufre una cefalea  intensa.  Se marea o pierde el conocimiento  Presenta nuseas o vmitos.  Se siente desorientada confundida.  Sufre convulsiones.  Tiene problemas de visin.  Siente dolor en el estmago.  Presenta una hemorragia vaginal abundante.  Tiene contracciones uterinas.  Tiene una prdida importante de lquido por la vagina DESPUS QUE NACE EL BEB:  Concurra a todos los controles de seguimiento y realice los anlisis de sangre segn las indicaciones de su mdico.  Mantenga un estilo de vida saludable para evitar la diabetes en el futuro. Aqu se incluye:  Siga el plan de alimentacin saludable.  Controle su peso.  Practique actividad fsica y descanse lo necesario.  No fume.  Amamante a su beb mientras pueda. Esto disminuir la probabilidad de que usted y su beb sufran diabetes posteriormente. Para ms informacin acerca de la diabetes, visite la pgina web de la American Diabetes Association: www.americandiabetesassociation.org. Para ms informacin acerca de la diabetes gestacional cite la pgina web del American Congress of Obstetricians and Gynecologists en: www.acog.org. Document Released: 12/17/2004 Document Revised: 06/01/2011 [employer] Patient Information 2013 [employer].  

## 2012-02-16 LAB — HIV ANTIBODY (ROUTINE TESTING W REFLEX): HIV: NONREACTIVE

## 2012-02-16 LAB — CBC
Hemoglobin: 11.3 g/dL — ABNORMAL LOW (ref 12.0–15.0)
MCH: 30.2 pg (ref 26.0–34.0)
MCHC: 35 g/dL (ref 30.0–36.0)
Platelets: 177 10*3/uL (ref 150–400)
RDW: 14.5 % (ref 11.5–15.5)

## 2012-02-16 LAB — HEMOGLOBIN A1C
Hgb A1c MFr Bld: 5.3 % (ref <5.7)
Mean Plasma Glucose: 105 mg/dL (ref <117)

## 2012-02-16 LAB — RPR

## 2012-02-22 ENCOUNTER — Ambulatory Visit (HOSPITAL_COMMUNITY)
Admission: RE | Admit: 2012-02-22 | Discharge: 2012-02-22 | Disposition: A | Discharge status: Home / Self Care | Payer: Self-pay | Source: Ambulatory Visit | Attending: Family Medicine | Admitting: Family Medicine

## 2012-02-22 DIAGNOSIS — O24919 Unspecified diabetes mellitus in pregnancy, unspecified trimester: Secondary | ICD-10-CM

## 2012-02-22 DIAGNOSIS — O99810 Abnormal glucose complicating pregnancy: Secondary | ICD-10-CM | POA: Insufficient documentation

## 2012-02-22 DIAGNOSIS — O2620 Pregnancy care for patient with recurrent pregnancy loss, unspecified trimester: Secondary | ICD-10-CM | POA: Insufficient documentation

## 2012-02-22 DIAGNOSIS — E669 Obesity, unspecified: Secondary | ICD-10-CM | POA: Insufficient documentation

## 2012-02-26 ENCOUNTER — Telehealth: Payer: Self-pay | Admitting: Obstetrics and Gynecology

## 2012-02-26 DIAGNOSIS — B9689 Other specified bacterial agents as the cause of diseases classified elsewhere: Secondary | ICD-10-CM

## 2012-02-26 MED ORDER — METRONIDAZOLE 500 MG PO TABS: 500.0000 mg | ORAL_TABLET | Freq: Two times a day (BID) | ORAL | Status: DC

## 2012-02-26 NOTE — Telephone Encounter (Signed)
Message copied by Toula Moos on Fri Feb 26, 2012  8:48 AM ------      Message from: Reva Bores      Created: Thu Feb 25, 2012  8:12 AM       Has BV--treat with flagyl 500 mg po bid x 7 d # 14, no RF

## 2012-02-26 NOTE — Telephone Encounter (Signed)
Called patient and notified of BV result and Rx sent to [facility] ([address] Tobi Bastos as the Nurse, learning disability. Patient agrees and satisfied.

## 2012-02-29 ENCOUNTER — Ambulatory Visit (INDEPENDENT_AMBULATORY_CARE_PROVIDER_SITE_OTHER): Payer: Self-pay | Admitting: Obstetrics and Gynecology

## 2012-02-29 VITALS — BP 108/64 | Temp 97.0°F | Wt 189.0 lb

## 2012-02-29 DIAGNOSIS — O24419 Gestational diabetes mellitus in pregnancy, unspecified control: Secondary | ICD-10-CM

## 2012-02-29 DIAGNOSIS — O99810 Abnormal glucose complicating pregnancy: Secondary | ICD-10-CM

## 2012-02-29 NOTE — Patient Instructions (Signed)
Mtodo para contar los movimientos fetales (Fetal Movement Counts) Nombre de la paciente: __________________________________________________ Fecha probable de parto:____________________ En los embarazos de alto riesgo se recomienda contar las pataditas, pero tambin es una buena idea que lo hagan todas las embarazadas. Comience a contarlas a las 28 semanas de embarazo. Los movimientos fetales aumentan luego de una comida completa o de comer o beber algo dulce (el nivel de azcar en la sangre est ms alto). Tambin es importante beber gran cantidad de lquidos (hidratarse bien) antes de contar. Si se recuesta sobre el lado izquierdo mejorar la circulacin, o puede sentarse en una silla cmoda con los brazos sobre el abdomen y sin distracciones que la rodeen. CONTANDO  Trate de contar a la misma hora todos los das que lo haga.  Marque el da y la hora y vea cunto le lleva sentir 10 movimientos (patadas, agitaciones, sacudones, vueltas). Debe sentir al menos 10 movimientos en 2 horas. Probablemente sienta los 10 movimientos en menos de dos horas. Si no los siente, espere una hora y cuente nuevamente. Luego de algunos das tendr un patrn.  Debemos observar si hay cambios en el patrn o no hay suficientes pataditas en 2 horas. Le lleva ms tiempo contar los 10 movimientos? SOLICITE ATENCIN MDICA SI:  Siente menos de 10 pataditas en 2 horas. Intntelo dos veces.  No siente movimientos durante 1 hora.  El patrn se modifica o le lleva ms tiempo cada da contar las 10 pataditas.  Siente que el beb no se mueve como lo hace habitualmente. Fecha: ____________ Movimientos: ____________ Comienzo hora: ____________ Fin hora: ____________ Fecha: ____________ Movimientos: ____________ Comienzo hora: ____________ Fin hora: ____________ Fecha: ____________ Movimientos: ____________ Comienzo hora: ____________ Fin hora: ____________ Fecha: ____________ Movimientos: ____________ Comienzo hora:  ____________ Fin hora: ____________ Fecha: ____________ Movimientos: ____________ Comienzo hora: ____________ Fin hora: ____________ Fecha: ____________ Movimientos: ____________ Comienzo hora: ____________ Fin hora: ____________ Fecha: ____________ Movimientos: ____________ Comienzo hora: ____________ Fin hora: ____________  Fecha: ____________ Movimientos: ____________ Comienzo hora: ____________ Fin hora: ____________ Fecha: ____________ Movimientos: ____________ Comienzo hora: ____________ Fin hora: ____________ Fecha: ____________ Movimientos: ____________ Comienzo hora: ____________ Fin hora: ____________ Fecha: ____________ Movimientos: ____________ Comienzo hora: ____________ Fin hora: ____________ Fecha: ____________ Movimientos: ____________ Comienzo hora: ____________ Fin hora: ____________ Fecha: ____________ Movimientos: ____________ Comienzo hora: ____________ Fin hora: ____________ Fecha: ____________ Movimientos: ____________ Comienzo hora: ____________ Fin hora: ____________  Fecha: ____________ Movimientos: ____________ Comienzo hora: ____________ Fin hora: ____________ Fecha: ____________ Movimientos: ____________ Comienzo hora: ____________ Fin hora: ____________ Fecha: ____________ Movimientos: ____________ Comienzo hora: ____________ Fin hora: ____________ Fecha: ____________ Movimientos: ____________ Comienzo hora: ____________ Fin hora: ____________ Fecha: ____________ Movimientos: ____________ Comienzo hora: ____________ Fin hora: ____________ Fecha: ____________ Movimientos: ____________ Comienzo hora: ____________ Fin hora: ____________ Fecha: ____________ Movimientos: ____________ Comienzo hora: ____________ Fin hora: ____________  Fecha: ____________ Movimientos: ____________ Comienzo hora: ____________ Fin hora: ____________ Fecha: ____________ Movimientos: ____________ Comienzo hora: ____________ Fin hora: ____________ Fecha: ____________ Movimientos: ____________  Comienzo hora: ____________ Fin hora: ____________ Fecha: ____________ Movimientos: ____________ Comienzo hora: ____________ Fin hora: ____________ Fecha: ____________ Movimientos: ____________ Comienzo hora: ____________ Fin hora: ____________ Fecha: ____________ Movimientos: ____________ Comienzo hora: ____________ Fin hora: ____________ Fecha: ____________ Movimientos: ____________ Comienzo hora: ____________ Fin hora: ____________  Fecha: ____________ Movimientos: ____________ Comienzo hora: ____________ Fin hora: ____________ Fecha: ____________ Movimientos: ____________ Comienzo hora: ____________ Fin hora: ____________ Fecha: ____________ Movimientos: ____________ Comienzo hora: ____________ Fin hora: ____________ Fecha: ____________ Movimientos: ____________ Comienzo hora: ____________ Fin hora: ____________ Fecha: ____________ Movimientos: ____________ Comienzo hora: ____________ Fin hora: ____________   Fecha: ____________ Movimientos: ____________ Comienzo hora: ____________ Fin hora: ____________ Fecha: ____________ Movimientos: ____________ Comienzo hora: ____________ Fin hora: ____________  Fecha: ____________ Movimientos: ____________ Comienzo hora: ____________ Fin hora: ____________ Fecha: ____________ Movimientos: ____________ Comienzo hora: ____________ Fin hora: ____________ Fecha: ____________ Movimientos: ____________ Comienzo hora: ____________ Fin hora: ____________ Fecha: ____________ Movimientos: ____________ Comienzo hora: ____________ Fin hora: ____________ Fecha: ____________ Movimientos: ____________ Comienzo hora: ____________ Fin hora: ____________ Fecha: ____________ Movimientos: ____________ Comienzo hora: ____________ Fin hora: ____________ Fecha: ____________ Movimientos: ____________ Comienzo hora: ____________ Fin hora: ____________  Fecha: ____________ Movimientos: ____________ Comienzo hora: ____________ Fin hora: ____________ Fecha: ____________ Movimientos:  ____________ Comienzo hora: ____________ Fin hora: ____________ Fecha: ____________ Movimientos: ____________ Comienzo hora: ____________ Fin hora: ____________ Fecha: ____________ Movimientos: ____________ Comienzo hora: ____________ Fin hora: ____________ Fecha: ____________ Movimientos: ____________ Comienzo hora: ____________ Fin hora: ____________ Fecha: ____________ Movimientos: ____________ Comienzo hora: ____________ Fin hora: ____________ Fecha: ____________ Movimientos: ____________ Comienzo hora: ____________ Fin hora: ____________  Fecha: ____________ Movimientos: ____________ Comienzo hora: ____________ Fin hora: ____________ Fecha: ____________ Movimientos: ____________ Comienzo hora: ____________ Fin hora: ____________ Fecha: ____________ Movimientos: ____________ Comienzo hora: ____________ Fin hora: ____________ Fecha: ____________ Movimientos: ____________ Comienzo hora: ____________ Fin hora: ____________ Fecha: ____________ Movimientos: ____________ Comienzo hora: ____________ Fin hora: ____________ Fecha: ____________ Movimientos: ____________ Comienzo hora: ____________ Fin hora: ____________ Fecha: ____________ Movimientos: ____________ Comienzo hora: ____________ Fin hora: ____________  Document Released: 06/16/2007 Document Revised: 06/01/2011 [employer] Patient Information 2013 [employer].  

## 2012-02-29 NOTE — Progress Notes (Signed)
P=104, c/o low back pain, Used Interpreter [employer]

## 2012-02-29 NOTE — Progress Notes (Signed)
Abnl glucose tolerance: 7/26 2013 1 hr glucola 158. Had 3 hr 7/31/ 2013: [id number]. 11/25 hb A1C 5.3, CBG 108. Not checking sugars at home. Follows diet. Had opthalmology exam fetal echo: normal. [place] 1 wk ago 65th%ile, AFI 17.3. See Maggie and get FBS next visit.

## 2012-03-07 ENCOUNTER — Encounter: Payer: Self-pay | Admitting: Advanced Practice Midwife

## 2012-03-07 ENCOUNTER — Encounter: Payer: Self-pay | Admitting: Family Medicine

## 2012-03-07 ENCOUNTER — Ambulatory Visit (INDEPENDENT_AMBULATORY_CARE_PROVIDER_SITE_OTHER): Payer: Self-pay | Admitting: Family Medicine

## 2012-03-07 VITALS — BP 99/62 | Temp 97.1°F | Wt 187.0 lb

## 2012-03-07 DIAGNOSIS — O24919 Unspecified diabetes mellitus in pregnancy, unspecified trimester: Secondary | ICD-10-CM

## 2012-03-07 DIAGNOSIS — IMO0002 Reserved for concepts with insufficient information to code with codable children: Secondary | ICD-10-CM | POA: Insufficient documentation

## 2012-03-07 LAB — POCT URINALYSIS DIP (DEVICE)
Bilirubin Urine: NEGATIVE
Glucose, UA: NEGATIVE mg/dL
Nitrite: NEGATIVE

## 2012-03-07 LAB — GLUCOSE, CAPILLARY

## 2012-03-07 NOTE — Progress Notes (Signed)
No further evidence of DM--will check FBS today--continue diet. FBS 88 today

## 2012-03-07 NOTE — Progress Notes (Signed)
P-89 

## 2012-03-07 NOTE — Patient Instructions (Signed)
Diabetes mellitus gestacional (Gestational Diabetes Mellitus) La diabetes mellitus gestacional se produce slo durante el embarazo. Aparece cuando el organismo no puede controlar adecuadamente la glucosa (azcar) que aumenta en la sangre despus de comer. Durante el embarazo, se produce una resistencia a la insulina (sensibilidad reducida a la insulina) debido a la liberacin de hormonas por parte de la placenta. Generalmente, el pncreas de una mujer embarazada produce la cantidad suficiente de insulina para vencer esa resistencia. Sin embargo, en la diabetes gestacional, hay insulina pero no cumple su funcin adecuadamente. Si la resistencia es lo suficientemente grave como para que el pncreas no produzca la cantidad de insulina suficiente, la glucosa extra se acumula en la sangre.  QUINES TIENEN RIESGO DE DESARROLLAR DIABETES GESTACIONAL?  Las mujeres con historia de diabetes en la familia.  Las mujeres de ms de 25 aos.  Las que presentan sobrepeso.  Las mujeres que pertenecen a ciertos grupos tnicos (latinas, afroamericanas, norteamericanas nativas, asiticas y las originarias de las islas del [employer]. QUE PUEDE OCURRIRLE AL BEB? Si el nivel de glucosa en sangre de la madre es demasiado elevado mientras este embarazada, el nivel extra de azcar pasar por el cordn umbilical hacia el beb. Algunos de los problemas del beb pueden ser:  Beb demasiado grande: si el nio recibe demasiada azcar, puede aumentar mucho de peso. Esto puede hacer que sea demasiado grande para nacer por parto normal (vaginal) por lo que ser necesario realizar una cesrea.  Bajo nivel de glucosa (hipoglucemia): el beb produce insulina extra en respuesta a la excesiva cantidad de azcar que obtiene de la madre. Cuando el beb nace y ya no necesita insulina extra, su nivel de azcar en sangre puede disminuir.  Ictericia (coloracin amarillenta de la piel y los ojos): esto es bastante frecuente en los bebs.  La causa es la acumulacin de una sustancia qumica denominada bilirrubina. No siempre es un trastorno grave, pero se observa con frecuencia en los bebs cuyas madres sufren diabetes gestacional. RIESGOS PARA LA MADRE Las mujeres que han sufrido diabetes gestacional pueden tener ms riesgos para algunos problemas como:  Preeclampsia o toxemia, incluyendo problemas con hipertensin arterial. La presin arterial y los niveles de protenas en la orina deben controlarse con frecuencia.  Infecciones  Parto por cesrea.  Aparicin de diabetes tipo 2 en una etapa posterior de la vida. Alrededor del 30% al 50% sufrir diabetes posteriormente, especialmente las que son obesas. DIAGNSTICO Las hormonas que causan resistencia a la insulina tienen su mayor nivel alrededor de las 24 a 28 semanas del embarazo. Si se experimentan sntomas, stos son similares a los sntomas que normalmente aparecen durante el embarazo.  La diabetes mellitus gestacional generalmente se diagnostica por medio de un mtodo en dos partes: 1. Despus de la 24 a 28 semanas de embarazo, la mujer debe beber una solucin que contiene glucosa y realizar un anlisis de sangre. Si el nivel de glucosa es elevado, la realizarn un segundo anlisis. 2. La prueba oral de tolerancia a la glucosa, que dura aproximadamente tres horas. Despus de realizar ayuno durante la noche, se controla nivel de glucosa en sangre. La mujer bebe una solucin que contiene glucosa y le realizan anlisis de glucosa en sangre cada hora. Si la mujer tiene factores de riesgos para la diabetes mellitus gestacional, el mdico podr indicar el anlisis antes de las 24 semanas de embarazo. TRATAMIENTO El tratamiento est dirigido a mantener la glucosa en sangre de la madre en un nivel normal y puede incluir:  La   planificacin de los alimentos.  Recibir insulina u otro medicamento para controlar el nivel de glucosa en sangre.  La prctica de ejercicios.  Llevar un  registro diario de los alimentos que consume.  Control y registro de los niveles de glucosa en sangre.  Control de los niveles de cetona en la orina, aunque esto ya no se considera necesario en la mayora de los embarazos. INSTRUCCIONES PARA EL CUIDADO DOMICILIARIO Mientras est embarazada:  Siga los consejos de su mdico relacionados con los controles prenatales, la planificacin de la comida, la actividad fsica, los medicamentos, vitaminas, los anlisis de sangre y otras pruebas y las actividades fsicas.  Lleve un registro de las comidas, las pruebas de glucosa en sangre y la cantidad de insulina que recibe (si corresponde). Muestre todo al profesional en cada consulta mdica prenatal.  Si sufre diabetes mellitus gestacional, podr tener problemas de hipoglucemia (nivel bajo de glucosa en sangre). Podr sospechar este problema si se siente repentinamente mareada, tiene temblores y/o se siente dbil. Si cree que esto le est ocurriendo, y tiene un medidor de glucosa, mida su nivel de glucosa en sangre. Siga los consejos de su mdico sobre el modo y el momento de tratar su nivel de glucosa en sangre. Generalmente se sigue la regla 15:15 Consuma 15 g de hidratos de carbono, espere 15 minutos y vuelva controlar el nivel de glucosa en sangre.. Ejemplos de 15 g de hidratos de carbono son:  1 taza de leche descremada.   taza de jugo.  3-4 tabletas de glucosa.  5-6 caramelos duros.  1 caja pequea de pasas de uva.   taza de gaseosa comn.  Mantenga una buena higiene para evitar infecciones.  No fume. SOLICITE ATENCIN MDICA SI:  Observa prdida vaginal con o sin picazn.  Se siente ms dbil o cansada que lo habitual.  Transpira mucho.  Tiene un aumento de peso repentino, 2,5 kg o ms en una semana.  Pierde peso, 1.5 kg o ms en una semana.  Su nivel de glucosa en sangre es elevado, necesita instrucciones. SOLICITE ATENCIN MDICA DE INMEDIATO SI:  Sufre una cefalea  intensa.  Se marea o pierde el conocimiento  Presenta nuseas o vmitos.  Se siente desorientada confundida.  Sufre convulsiones.  Tiene problemas de visin.  Siente dolor en el estmago.  Presenta una hemorragia vaginal abundante.  Tiene contracciones uterinas.  Tiene una prdida importante de lquido por la vagina DESPUS QUE NACE EL BEB:  Concurra a todos los controles de seguimiento y realice los anlisis de sangre segn las indicaciones de su mdico.  Mantenga un estilo de vida saludable para evitar la diabetes en el futuro. Aqu se incluye:  Siga el plan de alimentacin saludable.  Controle su peso.  Practique actividad fsica y descanse lo necesario.  No fume.  Amamante a su beb mientras pueda. Esto disminuir la probabilidad de que usted y su beb sufran diabetes posteriormente. Para ms informacin acerca de la diabetes, visite la pgina web de la American Diabetes Association: www.americandiabetesassociation.org. Para ms informacin acerca de la diabetes gestacional cite la pgina web del American Congress of Obst[employer]ians and Gynecologists en: www.acog.org. Document Released: 12/17/2004 Document Revised: 06/01/2011 [employer] Patient Information 2013 [employer].  Lactancia materna  (Breastfeeding) Decidir amamantar es una de las mejores elecciones que puede hacer por usted y su beb. La informacin que se brinda a continuacin le dar una breve visin de los beneficios de la lactancia materna as como de las dudas ms frecuentes alrededor de ella.    LOS BENEFICIOS DE AMAMANTAR  Para el beb   La primera leche (calostro ) ayuda al mejor funcionamiento del sistema digestivo del beb.   La leche tiene anticuerpos que provienen de la madre y que ayudan a prevenir las infecciones en el beb.   El beb tiene una menor incidencia de asma, alergias y del sndrome de muerte sbita del lactante (SMSL).   Los nutrientes en la leche materna son mejores para  el beb que los preparados para lactantes y la leche materna ayuda a un mejor desarrollo del cerebro del beb.   Los bebs amamantados tienen menos gases, clicos y estreimiento.  Para la mam   La lactancia materna favorece el desarrollo de un vnculo muy especial entre la madre y el beb.   Es ms conveniente, siempre disponible y a la temperatura adecuada y econmico.   Consume caloras en la madre y la ayuda a perder el peso ganado durante el embarazo.   Favorece la contraccin del tero a su tamao normal, de manera ms rpida y disminuye las hemorragias luego del parto.   Las madres que amamantan tienen menor riesgo de desarrollar cncer de mama.  FRECUENCIA DEL AMAMANTAMIENTO   Un beb sano, nacido a trmino, puede amamantarse con tanta frecuencia como cada hora, o espaciar las comidas cada tres horas.   Observe al beb cuando manifieste signos de hambre. Amamante a su beb si muestra signos de hambre. Esta frecuencia variar de un beb a otro.   Amamntelo tan seguido como el beb lo solicite, o cuando usted sienta la necesidad de aliviar sus mamas.   Despierte al beb si han pasado 3  4 horas desde la ltima comida.   El amamantamiento frecuente la ayudar a producir ms leche y a prevenir problemas de dolor en los pezones e hinchazn de las mamas.  POSICIN DEL BEBE PARA EL AMAMANTAMIENTO   Ya sea que se encuentre acostada o sentada, asegrese que el abdomen del beb enfrente el suyo.   Sostenga la mama con el pulgar por arriba y los otros 4 dedos por debajo. Asegrese que sus dedos se encuentren lejos del pezn y de la boca del beb.   Empuje suavemente los labios del beb con el pezn o con el dedo.   Cuando la boca del beb se abra lo suficiente, introduzca el pezn y la areola tanto como le sea posible dentro de la boca.   Coloque al beb cerca suyo de modo que su nariz y mejillas toquen las mamas al mamar.  ALIMENTACIN Y SUCCIN   La duracin  de cada comida vara de un beb a otro y de una comida a otra.   El beb debe succionar entre 2 y 3 minutos para que le llegue leche. Esto se denomina "bajada". Por este motivo, permita que el nio se alimente en cada mama todo lo que desee. Terminar de mamar cuando haya recibido la cantidad adecuada de nutrientes.   Para detener la succin coloque su dedo en la comisura de la boca del nio y deslcelo entre sus encas antes de quitarle la mama de la boca. Esto la ayudar a evitar el dolor en los pezones.  COMO SABER SI EL BEB OBTIENE LA SUFICIENTE LECHE MATERNA  Preguntarse si el beb obtiene la cantidad suficiente de leche es una preocupacin frecuente entre las madres. Puede asegurarse que el beb tiene la leche suficiente si:   El beb succiona activamente y usted escucha que traga .   El beb parece estar   relajado y satisfecho despus de mamar.   El nio se alimenta al menos 8 a 12 veces en 24 horas. Alimntelo hasta que se desprenda por sus propios medios o se quede dormido en la primera mama (al menos durante 10 a 20 minutos), luego ofrzcale el otro lado.   El beb moja 5 a 6 paales desechables (6 a 8 paales de tela) en 24 horas cuando tiene 5  6 das de vida.   Tiene al menos 3 a 4 deposiciones todos los das en los primeros meses. La materia fecal debe ser blanda y amarillenta.   El beb debe aumentar 4 a 6 libras (120 a 170 gr.) por semana despus de los 4 das de vida.   Siente que las mamas se ablandan despus de amamantar  REDUCIR LA CONGESTIN DE LAS MAMAS   Durante la primera semana despus del parto, usted puede experimentar hinchazn en las mamas. Cuando las mamas estn congestionadas, se sienten calientes, llenas y molestas al tacto. Puede reducir la congestin si:   Lo amamanta frecuentemente, cada 2-3 horas. Las mams que amamantan pronto y con frecuencia tienen menos problemas de congestin.   Coloque compresas de hielo en sus mamas durante 10-20  minutos entre cada amamantamiento. Esto ayuda a reducir la hinchazn. Envuelva las bolsas de hielo en una toalla liviana para proteger su piel. Las bolsas de vegetales congelados funcionan bien para este propsito.   Tome una ducha tibia o aplique compresas hmedas calientes en las mamas durante 5 a 10 minutos antes de cada vez que amamanta. Esto aumenta la circulacin y ayuda a que la leche fluya.   Masajee suavemente la mama antes y durante la alimentacin. Con las puntas de los dedos, masajee desde la pared torcica hacia abajo hasta llegar al pezn, con movimientos circulares.   Asegrese que el nio vaca al menos una mama antes de cambiar de lado.   Use un sacaleche para vaciar la mama si el beb se duerme o no se alimenta bien. Tambin podr quitarse la leche con esa bomba si tiene que volver al trabajo o siente que las mamas estn congestionadas.   Evite los biberones, chupetes o complementar la alimentacin con agua o jugos en lugar de la leche materna. La leche materna es todo el alimento que el beb necesita. No es necesario que el nio ingiera agua o preparados de bibern. De hecho, es lo mejor para ayudar a que las mamas produzcan ms leche. no darle suplementos al nio durante las primeras semanas.   Verifique que el beb se encuentra en la posicin correcta mientras lo alimenta.   Use un sostn que soporte bien sus mamas y evite los que tienen aro.   Consuma una dieta balanceada y beba lquidos en cantidad.   Descanse con frecuencia, reljese y tome sus vitaminas prenatales para evitar la fatiga, el estrs y la anemia.  Si sigue estas indicaciones, la congestin debe mejorar en 24 a 48 horas. Si an tiene dificultades, consulte a su asesor en lactancia.  CUDESE USTED MISMA  Cuide sus mamas.   Bese o dchese diariamente.   Evite usar jabn en los pezones.   Comience a amamantar del lado izquierdo en una comida y del lado derecho en la siguiente.   Notar que  aumenta el flujo de leche a los 2 a 5 das despus del parto. Puede sentir algunas molestias por la congestin, lo que hace que sus mamas estn duras y sensibles. La congestin disminuye en 24 a 48   horas. Mientras tanto, aplique toallas hmedas calientes durante 5 a 10 minutos antes de amamantar. Un masaje suave y la extraccin de un poco de leche antes de amamantar ablandarn las mamas y har ms fcil que el beb se agarre.   Use un buen sostn y seque al aire los pezones durante 3 a 4 minutos luego de cada alimentacin.   Solo utilice apsitos de algodn.   Utilice lanolina pura sobre los pezones luego de amamantar. No necesita lavarlos luego de alimentar al nio. Otra opcin es exprimir algunas gotas de leche y masajear suavemente los pezones.  Cumpla con estos cuidados   Consuma alimentos bien balanceados y refrigerios nutritivos.   Beba leche, jugos de fruta y agua para satisfacer la sed (alrededor de 8 vasos por da).   Descanse lo suficiente.  Evite los alimentos que usted note que pueden afectar al beb.  SOLICITE ATENCIN MDICA SI:   Tiene dificultad con la lactancia materna y necesita ayuda.   Tiene una zona de color rojo, dura y dolorosa en la mama que se acompaa de fiebre.   El beb est muy somnoliento como para alimentarse bien o tiene problemas para dormir.   Su beb moja menos de 6 paales al da, a los 5 das de vida.   La piel del beb o la parte blanca de sus ojos est ms amarilla de lo que estaba en el hospital.   Se siente deprimida.  Document Released: 03/09/2005 Document Revised: 09/08/2011 [employer] Patient Information 2013 [employer].  

## 2012-03-21 ENCOUNTER — Encounter: Payer: Self-pay | Admitting: Obstetrics and Gynecology

## 2012-03-21 ENCOUNTER — Ambulatory Visit (INDEPENDENT_AMBULATORY_CARE_PROVIDER_SITE_OTHER): Payer: Self-pay | Admitting: Obstetrics and Gynecology

## 2012-03-21 VITALS — BP 97/67 | Temp 98.2°F | Wt 187.8 lb

## 2012-03-21 DIAGNOSIS — O99810 Abnormal glucose complicating pregnancy: Secondary | ICD-10-CM

## 2012-03-21 DIAGNOSIS — O24419 Gestational diabetes mellitus in pregnancy, unspecified control: Secondary | ICD-10-CM

## 2012-03-21 LAB — POCT URINALYSIS DIP (DEVICE)
Bilirubin Urine: NEGATIVE
Glucose, UA: NEGATIVE mg/dL
Hgb urine dipstick: NEGATIVE
Ketones, ur: NEGATIVE mg/dL
Nitrite: NEGATIVE
Protein, ur: NEGATIVE mg/dL
Specific Gravity, Urine: 1.025 (ref 1.005–1.030)
Urobilinogen, UA: 0.2 mg/dL (ref 0.0–1.0)
pH: 6.5 (ref 5.0–8.0)

## 2012-03-21 NOTE — Progress Notes (Signed)
Reports 1 UC 1q 2hr. RLP sx discussed.  No longer checking CBGs (3 ht OGTT WNL). No hx LGA. Advised continue on diet. [place] scheduled fro F/U growth d/t marginal insertion of placenta.

## 2012-03-21 NOTE — Progress Notes (Signed)
Pulse-90   Edema-feet at night

## 2012-03-21 NOTE — Patient Instructions (Signed)
Guía de planeamiento de la alimentación para diabéticos  (Diabetes Meal Planning Guide)  La guía de planeamiento de alimentación para diabéticos es una herramienta para ayudarlo a planear sus comidas y colaciones. Es importante para las personas con diabetes controlar sus niveles de azúcar. Elegir los alimentos correctos y las cantidades adecuadas durante el día le ayudará a controlar el azúcar en sangre. Comer bien puede incluso ayudarlo a mejorar la presión sanguínea y alcanzar o mantener un peso saludable.  CUENTE LOS HIDRATOS DE CARBONO CON FACILIDAD  Cuando consume hidratos de carbono, éstos se transforman en azúcar (glucosa). Esto a su vez aumenta el nivel de azúcar en sangre. El conteo de carbohidratos puede ayudarlo a controlar este nivel para que se sienta mejor. Al planear sus alimentos con el conteo de carbohidratos, podrá tener más flexibilidad en lo que come y equilibrar la medicación con el consumo de alimentos.  El conteo de carbohidratos significa simplemente sumar la cantidad total de gramos de carbohidratos a sus comidas o colaciones. Trate de consumir la misma cantidad en cada comida. A continuación encontrará una lista de 1 porción o 15 gr. de carbohidratos. A continuación se enumeran. Pregunte al médico cuántos gramos de carbohidratos necesita comer en cada comida o colación.  Almidones y granos  · 1 rebanada de pan.  · ½ bollo inglés o bollo para hamburguesa o hotdog.  · ¾ taza de cereal frío (sin azúcar).  · ? taza de pasta o arroz cocido.  · ½ taza de vegetales que contengan almidón (maíz, papas, arvejas, porotos, calabaza).  · 1 omelette (6 pulgadas).  · ¼ bollo.  · 1 waffle o panqueque (del tamaño de un CD).  · ½ taza de cereales cocidos.  · 4 a 6 galletas saldas pequeñas.  *Se recomienda el consumo de granos enteros.  Frutas  · 1 taza de frutos rojos, melón, papaya o ananá sin azúcar.  · 1 fruta fresca pequeña.  · ½ banana o mango.  · ½ taza de jugo de frutas (4 onzas sin endulzar).  · ½  taza de fruta envasada en jugo natural o agua.  · 2 cucharadas de frutas secas.  · 12-15 uvas o cerezas.  Leche y yogurt  · 1 taza de leche descremada o al 1%.  · 1 taza de leche de soja.  · 6 onzas de yogurt descremado con edulcorante sin azúcar.  · 6 onzas de yogur descremado de soja.  · 6 onzas de yogur natural.  Vegetales  · 1 taza de vegetales crudos o ½ de vegetales cocidos se considera cero carbohidratos o una comida "libre".  · Si come 3 o más porciones en una comida, cuéntelas como 1 porción de carbohidratos.  Otros carbohidratos  · ¾ onzas de chips o pretzels.  · ½ taza de helado de crema o yogur helado.  · ¼ taza de helado de agua.  · 5 cm de torta no congelada.  · 1 cucharada de miel, azúcar, mermelada, jalea o almíbar.  · 2 galletitas dulces pequeñas.  · 3 cuadrados de crackers de graham.  · 3 tazas de palomitas de maíz.  · 6 crackers.  · 1 taza de caldo.  · Cuente 1 taza de guisado u otra mezcla de alimentos como 2 porciones de carbohidratos.  · Los alimentos con menos de 20 calorías por porción deben contarse como cero carbohidratos o alimento "libre".  Si lo desea compre un libro o software de computación que enumere la cantidad de gramos de carbohidratos de   los diferentes alimentos. Además, el panel nutricional en las etiquetas de los productos que consume es una buena fuente de información. Le indicará el tamaño de la porción y la cantidad total de carbohidratos que consumirá por cada una. Divida este número por 15 para obtener el número de conteo de carbohidratos por porción. Recuerde: cada porción son 15 gramos de carbohidratos.  PORCIONES  La medición de los alimentos y el tamaño de las porciones lo ayudarán a controlar la cantidad exacta de comida que debe ingerir. La lista que sigue le mostrará el tamaño de algunas porciones comunes.   · 1 onza.................4 dados apilados.  · 3 onzas...............Un mazo de cartas.  · 1 cucharadita.....La punta de un dedo pequeño.  · 1  cucharada........Un dedo.  · 2 cucharadas......Una pelota de golf.  · ½ taza...............La mitad de un puño.  · 1 taza................Un puño.  EJEMPLO DE PLAN DE ALIMENTACIÓN PARA DIABÉTICOS:  A continuación se muestra un ejemplo de plan de alimentación que incluye comidas de los grupos de granos y féculas, vegetales, frutas y carnes. Un nutricionista podrá confeccionarle un plan individualizado para cubrir sus necesidades calóricas y decirle el número de porciones que necesita de cada grupo. Sin embargo, podría intercambiar los alimentos que contengan carbohidratos (lácteos, cereales y frutas). Controlar la cantidad total de carbohidratos en los alimentos o colaciones es más importante que asegurarse de incluir todos los grupos alimenticios cada vez que come.   El siguiente plan de alimentación es un ejemplo de una dieta de 2000 calorías mediante el conteo de carbohidratos. Este plan contiene 17 porciones de carbohidratos.  Desayuno  · 1 taza de avena (2 porciones de carbohidratos).  · ¾ taza de yogur light(1 porción de carbohidratos).  · 1 taza de arándanos (1 porción de carbohidratos).  · ¼ taza de almendras.  Colación  · 1 manzana grande (2 porciones de carbohidratos).  · 1 palito de queso bajo en grasa.  Almuerzo  · Ensalada de pechuga de pollo.  · 1 taza de espinacas.  · ¼ taza de tomates cortados.  · 2 oz (60 gr) de pechuga de pollo en rebanadas.  · 2 cucharadas de aderezo italiano bajo en contenido graso.  · 12 galletas integrales (2 porciones de carbohidratos).  · 12 a 15 uvas (1 porción de carbohidratos).  · 1 taza de leche descremada (1porción de carbohidratos).  Colación  · 1 taza de zanahorias.  · ½ taza de puré de garbanzos (1 porción de carbohidratos).  Cena  · 3 oz (80 gr) de salmón a la parrilla.  · 1 taza de arroz integral (3 porciones de carbohidratos).  Colación  · 1 ½ taza de brócoli al vapor (1 porción de carbohidrato) con una cucharadita de aceite de oliva y jugo de limón.  · 1 taza de  budín light (2 porciones de carbohidratos).  HOJA DE PLANEAMIENTO DE LA ALIMENTACIÓN:  El dietista podrá utilizar esta hoja para ayudarlo a decidir cuántas porciones y qué tipos de alimentos son los adecuados para usted.   DESAYUNO  Grupo de alimentos y porciones / Alimento elegido  Granos/Féculas_________________________________________________  Lácteos________________________________________________________  Vegetales ______________________________________________________  Fruta __________________________________________________________  Carnes _________________________________________________________  Grasas _________________________________________________________  ALMUERZO  Grupo de alimentos y porciones / Alimento elegido  Granos/Féculas___________________________________________________  Lácteos_________________________________________________________  Fruta ___________________________________________________________  Carnes __________________________________________________________  Grasas __________________________________________________________  CENA  Grupo de alimentos y porciones / Alimento elegido  Granos/Féculas___________________________________________________  Lácteos_________________________________________________________  Fruta ___________________________________________________________  Carnes __________________________________________________________  Grasas __________________________________________________________  COLACIÓN  Grupo de alimentos y porciones /

## 2012-03-23 NOTE — L&D Delivery Note (Signed)
Delivery Note At 2:46 AM a viable female was delivered via Vaginal, Spontaneous Delivery (Presentation: ; [place]ip[place] Anterior).  APGAR: 8, 9; weight pending.   Placenta status: Intact, Spontaneous.  Cord:  with the following complications: Tight shoulder cord, short.    Anesthesia: Epidural  Episiotomy: None Lacerations: 2nd degree Suture Repair: 3.0 vicryl rapide Est. Blood Loss (mL): 400  Mom to postpartum.  Baby to nursery-stable.  [name] 05/09/2012, 3:16 AM  Delivery and repair by Dorathy Kinsman, CNM

## 2012-03-24 ENCOUNTER — Ambulatory Visit (HOSPITAL_COMMUNITY)
Admission: RE | Admit: 2012-03-24 | Discharge: 2012-03-24 | Disposition: A | Discharge status: Home / Self Care | Payer: Self-pay | Source: Ambulatory Visit | Attending: Obstetrics and Gynecology | Admitting: Obstetrics and Gynecology

## 2012-03-24 DIAGNOSIS — O2620 Pregnancy care for patient with recurrent pregnancy loss, unspecified trimester: Secondary | ICD-10-CM | POA: Insufficient documentation

## 2012-03-24 DIAGNOSIS — E669 Obesity, unspecified: Secondary | ICD-10-CM | POA: Insufficient documentation

## 2012-03-24 DIAGNOSIS — O99210 Obesity complicating pregnancy, unspecified trimester: Secondary | ICD-10-CM | POA: Insufficient documentation

## 2012-03-24 DIAGNOSIS — O99810 Abnormal glucose complicating pregnancy: Secondary | ICD-10-CM | POA: Insufficient documentation

## 2012-03-24 DIAGNOSIS — O24419 Gestational diabetes mellitus in pregnancy, unspecified control: Secondary | ICD-10-CM

## 2012-03-28 ENCOUNTER — Encounter: Payer: Self-pay | Admitting: *Deleted

## 2012-04-04 ENCOUNTER — Ambulatory Visit (INDEPENDENT_AMBULATORY_CARE_PROVIDER_SITE_OTHER): Payer: Self-pay | Admitting: Family Medicine

## 2012-04-04 ENCOUNTER — Other Ambulatory Visit: Payer: Self-pay | Admitting: Advanced Practice Midwife

## 2012-04-04 VITALS — BP 105/71 | Temp 97.3°F | Wt 189.3 lb

## 2012-04-04 DIAGNOSIS — IMO0002 Reserved for concepts with insufficient information to code with codable children: Secondary | ICD-10-CM

## 2012-04-04 DIAGNOSIS — IMO0001 Reserved for inherently not codable concepts without codable children: Secondary | ICD-10-CM

## 2012-04-04 LAB — POCT URINALYSIS DIP (DEVICE)
Bilirubin Urine: NEGATIVE
Glucose, UA: NEGATIVE mg/dL
Nitrite: NEGATIVE

## 2012-04-04 NOTE — Progress Notes (Signed)
No complaints. Growth sono 03/24/12 normal 76%. 1 hr GTT 199, 3 hr GTT normal.

## 2012-04-04 NOTE — Progress Notes (Signed)
Pulse 83 Patient reports abdominal pain & pressure from contractions that occurs every 3-4 hours

## 2012-04-04 NOTE — Patient Instructions (Signed)
Embarazo  Tercer trimestre  (Pregnancy - Third Trimester) El tercer trimestre del embarazo (los ltimos 3 meses) es el perodo en el cual tanto usted como su beb crecen con ms rapidez. El beb alcanza un largo de aproximadamente 50 cm. y pesa entre 2,700 y 4,500 kg. El beb gana ms tejido graso y est listo para la vida fuera del cuerpo de la madre. Mientras estn en el interior, los bebs tienen perodos de sueo y vigilia, succionan el pulgar y tienen hipo. Quizs sienta pequeas contracciones del tero. Este es el falso trabajo de parto. Tambin se las conoce como contracciones de Braxton-Hicks . Es como una prctica del parto. Los problemas ms habituales de esta etapa del embarazo incluyen mayor dificultad para respirar, hinchazn de las manos y los pies por retencin de lquidos y la necesidad de orinar con ms frecuencia debido a que el tero y el beb presionan sobre la vejiga.  EXAMENES PRENATALES   Durante los exmenes prenatales, deber seguir realizndose anlisis de sangre. Estas pruebas se realizan para controlar su salud y la del beb. Los anlisis de sangre se realizan para conocer los niveles de algunos compuestos de la sangre (hemoglobina). La anemia (bajo nivel de hemoglobina) es frecuente durante el embarazo. Para prevenirla, se administran hierro y vitaminas. Tambin le tomarn nuevas anlisis para descartar diabetes. Podrn repetirle algunas de las pruebas que le hicieron previamente.  En cada visita le medirn el tamao del tero. Esto permite asegurar que el beb se desarrolla adecuadamente, segn la fecha del embarazo.  Le controlarn la presin arterial en cada visita prenatal. Esto es para asegurarse de que no sufre toxemia.  Le harn un anlisis de orina en cada visita prenatal, para descartar infecciones, diabetes y la presencia de protenas.  Tambin en cada visita controlarn su peso. Esto se realiza para asegurarse que aumenta de peso al ritmo indicado y que usted y su  beb evolucionan normalmente.  En algunas ocasiones se realiza una prueba de ultrasonido para confirmar el correcto desarrollo y evolucin del beb. Esta prueba se realiza con ondas sonoras inofensivas para el beb, de modo que el profesional pueda calcular ms precisamente la fecha del parto.  Analice con su mdico los analgsicos y la anestesia que recibir durante el trabajo de parto y el parto.  Comente la posibilidad de que necesite una cesrea y qu anestesia se recibir.  Informe a su mdico si sufre violencia familiar mental o fsica. A veces, se indica la prueba especializada sin estrs, la prueba de tolerancia a las contracciones y el perfil biofsico para asegurarse de que el beb no tiene problemas. El estudio del lquido amnitico que rodea al beb se llama amniocentesis. El lquido amnitico se obtiene introduciendo una aguja en el vientre (abdomen ). En ocasiones se lleva a cabo cerca del final del embarazo, si es necesario inducir a un parto. En este caso se realiza para asegurarse que los pulmones del beb estn lo suficientemente maduros como para que pueda vivir fuera del tero. Si los pulmones no han madurado y es peligroso que el beb nazca, se administrar a la madre una inyeccin de cortisona , 1 a 2 das antes del parto. . Esto ayuda a que los pulmones del beb maduren y sea ms seguro su nacimiento.  CAMBIOS QUE OCURREN EN EL TERCER TRIMESTRE DEL EMBARAZO  Su organismo atravesar numerosos cambios durante el embarazo. Estos pueden variar de una persona a otra. Converse con el profesional que la asiste acerca los cambios que   usted note y que la preocupen.   Durante el ltimo trimestre probablemente sienta un aumento del apetito. Es normal tener "antojos" de ciertas comidas. Esto vara de una persona a otra y de un embarazo a otro.  Podrn aparecer las primeras estras en las caderas, abdomen y mamas. Estos son cambios normales del cuerpo durante el embarazo. No existen  medicamentos ni ejercicios que puedan prevenir estos cambios.  La constipacin puede tratarse con un laxante o agregando fibra a su dieta. Beber grandes cantidades de lquidos, tomar fibras en forma de vegetales, frutas y granos integrales es de gran ayuda.  Tambin es beneficioso practicar actividad fsica. Si ha sido una persona activa hasta el embarazo, podr continuar con la mayora de las actividades durante el mismo. Si ha sido menos activa, puede ser beneficioso que comience con un programa de ejercicios, como realizar caminatas. Consulte con el profesional que la asiste antes de comenzar un programa de ejercicios.  Evite el consumo de cigarrillos, el alcohol, los medicamentos no recetados y las "drogas de la calle" durante el embarazo. Estas sustancias qumicas afectan la formacin y el desarrollo del beb. Evite estas sustancias durante todo el embarazo para asegurar el nacimiento de un beb sano.  Podr sentir dolor de espalda, tener vrices en las venas y hemorroides, o si ya los sufra, pueden empeorar.  Durante el tercer trimestre se cansar con ms facilidad, lo cual es normal.  Los movimientos del beb pueden ser ms fuertes y con ms frecuencia.  Puede que note dificultades para respirar normalmente.  El ombligo puede salir hacia afuera.  A veces sale una secrecin amarilla de las mamas, que se llama calostro.  Podr aparecer una secrecin mucosa con sangre. Esto suele ocurrir entre unos pocos das y una semana antes del parto. INSTRUCCIONES PARA EL CUIDADO EN EL HOGAR   Cumpla con las citas de control. Siga las indicaciones del mdico con respecto al uso de medicamentos, los ejercicios y la dieta.  Durante el embarazo debe obtener nutrientes para usted y para su beb. Consuma alimentos balanceados a intervalos regulares. Elija alimentos como carne, pescado, leche y otros productos lcteos descremados, vegetales, frutas, panes integrales y cereales. El mdico le informar  cul es el aumento de peso ideal.  Las relaciones sexuales pueden continuarse hasta casi el final del embarazo, si no se presentan otros problemas como prdida prematura (antes de tiempo) de lquido amnitico, hemorragia vaginal o dolor en el vientre (abdominal).  Realice actividad fsica todos los das, si no tiene restricciones. Consulte con el profesional que la asiste si no sabe con certeza si determinados ejercicios son seguros. El mayor aumento de peso se producir en los ltimos 2 trimestres del embarazo. El ejercicio ayuda a:  Controlar su peso.  Mantenerse en forma para el trabajo de parto y el parto .  Perder peso despus del parto.  Haga reposo con frecuencia, con las piernas elevadas, o segn lo necesite para evitar los calambres y el dolor de cintura.  Use un buen sostn o como los que se usan para hacer deportes para aliviar la sensibilidad de las mamas. Tambin puede serle til si lo usa mientras duerme. Si pierde calostro, podr utilizar apsitos en el sostn.  No utilice la baera con agua caliente, baos turcos y saunas.  Colquese el cinturn de seguridad cuando conduzca. Este la proteger a usted y al beb en caso de accidente.  Evite comer carne cruda y el contacto con los utensilios y desperdicios de los gatos. Estos elementos   contienen grmenes que pueden causar defectos de nacimiento en el beb.  Es fcil perder algo de orina durante el embarazo. Apretar y fortalecer los msculos de la pelvis la ayudar con este problema. Practique detener la miccin cuando est en el bao. Estos son los mismos msculos que necesita fortalecer. Son tambin los mismos msculos que utiliza cuando trata de evitar despedir gases. Puede practicar apretando estos msculos diez veces, y repetir esto tres veces por da aproximadamente. Una vez que conozca qu msculos debe apretar, no realice estos ejercicios durante la miccin. Puede favorecerle una infeccin si la orina vuelve hacia  atrs.  Pida ayuda si tienen necesidades financieras, teraputicas o nutricionales. El profesional podr ayudarla con respecto a estas necesidades, o derivarla a otros especialistas.  Haga una lista de nmeros telefnicos de emergencia y tngalos disponibles.  Planifique como obtener ayuda de familiares o amigos cuando regrese a casa desde el hospital.  Hacer un ensayo sobre la partida al hospital.  Tome clases prenatales con el padre para entender, practicar y hacer preguntas sobre el trabajo de parto y el alumbramiento.  Preparar la habitacin del beb / busque una guardera.  No viaje fuera de la ciudad a menos que sea absolutamente necesario y con el asesoramiento de su mdico.  Use slo zapatos de tacn bajo o sin tacn para tener mejor equilibrio y evitar cadas. USO DE MEDICAMENTOS Y CONSUMO DE DROGAS DURANTE EL EMBARAZO   Tome las vitaminas apropiadas para esta etapa tal como se le indic. Las vitaminas deben contener un miligramo de cido flico. Guarde todas las vitaminas fuera del alcance de los nios. La ingestin de slo un par de vitaminas o tabletas que contengan hierro pueden ocasionar la muerte en un beb o en un nio pequeo.  Evite el uso de todos los medicamentos, incluyendo hierbas, medicamentos de venta libre, sin receta o que no hayan sido sugeridos por su mdico. Slo tome medicamentos de venta libre o medicamentos recetados para el dolor, el malestar o fiebre como lo indique su mdico. No tome aspirina, ibuprofeno (Motrin, Advil, Nuprin) o naproxeno (Aleve) excepto que su mdico se lo indique.  Infrmele al profesional si consume alguna droga.  El alcohol se relaciona con ciertos defectos congnitos. Incluye el sndrome de alcoholismo fetal. Debe evitar absolutamente el consumo de alcohol, en cualquier forma. El fumar produce baja tasa de natalidad y bebs prematuros.  Las drogas ilegales o de la calle son muy perjudiciales para el beb. Estn absolutamente  prohibidas. Un beb que nace de una madre adicta, ser adicto al nacer. Ese beb tendr los mismos sntomas de abstinencia que un adulto. SOLICITE ATENCIN MDICA SI:  Tiene preguntas o preocupaciones relacionadas con el embarazo. Es mejor que llame para formular las preguntas si no puede esperar hasta la prxima visita, que sentirse preocupada por ellas.  DECISIONES ACERCA DE LA CIRCUNCISIN  Usted puede saber o no cul es el sexo de su beb. Si ya sabe que ser un varn, este es el momento de pensar acerca de la circuncisin. La circuncisin es la extirpacin del prepucio. Esta es la piel que cubre el extremo sensible del pene. No hay un motivo mdico que lo justifique. Generalmente la decisin se toma segn lo que sea popular en ese momento, o segn creencias religiosas. Podr conversar estos temas con su mdico o con el pediatra.  SOLICITE ATENCIN MDICA DE INMEDIATO SI:   La temperatura oral le sube a ms de 102 F (38.9 C) o lo que su mdico le   indique.  Tiene una prdida de lquido por la vagina (canal de parto). Si sospecha una ruptura de las membranas, tmese la temperatura y llame al profesional para informarlo sobre esto.  Observa unas pequeas manchas, una hemorragia vaginal o elimina cogulos. Notifique al profesional acerca de la cantidad y de cuntos apsitos est utilizando.  Presenta un olor desagradable en la secrecin vaginal y observa un cambio en el color, de transparente a blanco.  Ha vomitado durante ms de 24 horas.  Siente escalofros o le sube la fiebre.  Le falta el aire.  Siente ardor al orinar.  Baja o sube ms de 2 libras (900 g), o segn lo indicado por el profesional que la asiste.  Observa que sbitamente se le hinchan el rostro, las manos, los pies o las piernas.  Siente dolor en el vientre (abdominal). Las molestias en el ligamento redondo son una causa benigna frecuente de dolor abdominal durante el embarazo. El profesional que la asiste deber  evaluarla.  Presenta dolor de cabeza intenso que no se alivia.  Tiene problemas visuales, visin doble o borrosa.  Si no siente los movimientos del beb durante ms de 1 hora. Si piensa que el beb no se mueve tanto como lo haca habitualmente, coma algo que contenga azcar y recustese sobre el lado izquierdo durante una hora. El beb debe moverse al menos 4  5 veces por hora. Comunquese inmediatamente si el beb se mueve menos que lo indicado.  Se cae, se ve involucrada en un accidente automovilstico o sufre algn tipo de traumatismo.  En su hogar hay violencia mental o fsica. Document Released: 12/17/2004 Document Revised: 09/08/2011 [employer] Patient Information 2013 [employer].  

## 2012-04-18 ENCOUNTER — Ambulatory Visit (INDEPENDENT_AMBULATORY_CARE_PROVIDER_SITE_OTHER): Payer: BC Managed Care – PPO | Admitting: Family Medicine

## 2012-04-18 VITALS — BP 106/70 | Temp 97.2°F | Wt 191.8 lb

## 2012-04-18 DIAGNOSIS — IMO0001 Reserved for inherently not codable concepts without codable children: Secondary | ICD-10-CM

## 2012-04-18 DIAGNOSIS — O99810 Abnormal glucose complicating pregnancy: Secondary | ICD-10-CM

## 2012-04-18 DIAGNOSIS — IMO0002 Reserved for concepts with insufficient information to code with codable children: Secondary | ICD-10-CM

## 2012-04-18 DIAGNOSIS — O0990 Supervision of high risk pregnancy, unspecified, unspecified trimester: Secondary | ICD-10-CM

## 2012-04-18 DIAGNOSIS — O24419 Gestational diabetes mellitus in pregnancy, unspecified control: Secondary | ICD-10-CM

## 2012-04-18 LAB — POCT URINALYSIS DIP (DEVICE)
Glucose, UA: NEGATIVE mg/dL
Nitrite: NEGATIVE
Specific Gravity, Urine: 1.025 (ref 1.005–1.030)
Urobilinogen, UA: 0.2 mg/dL (ref 0.0–1.0)

## 2012-04-18 NOTE — Patient Instructions (Addendum)
Pregnancy - Third Trimester  The third trimester of pregnancy (the last 3 months) is a period of the most rapid growth for you and your baby. The baby approaches a length of 20 inches and a weight of 6 to 10 pounds. The baby is adding on fat and getting ready for life outside your body. While inside, babies have periods of sleeping and waking, suck their thumbs, and hiccups. You can often feel small contractions of the uterus. This is false labor. It is also called Braxton-Hicks contractions. This is like a practice for labor. The usual problems in this stage of pregnancy include more difficulty breathing, swelling of the hands and feet from water retention, and having to urinate more often because of the uterus and baby pressing on your bladder.   PRENATAL EXAMS  · Blood work may continue to be done during prenatal exams. These tests are done to check on your health and the probable health of your baby. Blood work is used to follow your blood levels (hemoglobin). Anemia (low hemoglobin) is common during pregnancy. Iron and vitamins are given to help prevent this. You may also continue to be checked for diabetes. Some of the past blood tests may be done again.  · The size of the uterus is measured during each visit. This makes sure your baby is growing properly according to your pregnancy dates.  · Your blood pressure is checked every prenatal visit. This is to make sure you are not getting toxemia.  · Your urine is checked every prenatal visit for infection, diabetes and protein.  · Your weight is checked at each visit. This is done to make sure gains are happening at the suggested rate and that you and your baby are growing normally.  · Sometimes, an ultrasound is performed to confirm the position and the proper growth and development of the baby. This is a test done that bounces harmless sound waves off the baby so your caregiver can more accurately determine due dates.  · Discuss the type of pain medication and  anesthesia you will have during your labor and delivery.  · Discuss the possibility and anesthesia if a Cesarean Section might be necessary.  · Inform your caregiver if there is any mental or physical violence at home.  Sometimes, a specialized non-stress test, contraction stress test and biophysical profile are done to make sure the baby is not having a problem. Checking the amniotic fluid surrounding the baby is called an amniocentesis. The amniotic fluid is removed by sticking a needle into the belly (abdomen). This is sometimes done near the end of pregnancy if an early delivery is required. In this case, it is done to help make sure the baby's lungs are mature enough for the baby to live outside of the womb. If the lungs are not mature and it is unsafe to deliver the baby, an injection of cortisone medication is given to the mother 1 to 2 days before the delivery. This helps the baby's lungs mature and makes it safer to deliver the baby.  CHANGES OCCURING IN THE THIRD TRIMESTER OF PREGNANCY  Your body goes through many changes during pregnancy. They vary from person to person. Talk to your caregiver about changes you notice and are concerned about.  · During the last trimester, you have probably had an increase in your appetite. It is normal to have cravings for certain foods. This varies from person to person and pregnancy to pregnancy.  · You may begin to   get stretch marks on your hips, abdomen, and breasts. These are normal changes in the body during pregnancy. There are no exercises or medications to take which prevent this change.  · Constipation may be treated with a stool softener or adding bulk to your diet. Drinking lots of fluids, fiber in vegetables, fruits, and whole grains are helpful.  · Exercising is also helpful. If you have been very active up until your pregnancy, most of these activities can be continued during your pregnancy. If you have been less active, it is helpful to start an exercise  program such as walking. Consult your caregiver before starting exercise programs.  · Avoid all smoking, alcohol, un-prescribed drugs, herbs and "street drugs" during your pregnancy. These chemicals affect the formation and growth of the baby. Avoid chemicals throughout the pregnancy to ensure the delivery of a healthy infant.  · Backache, varicose veins and hemorrhoids may develop or get worse.  · You will tire more easily in the third trimester, which is normal.  · The baby's movements may be stronger and more often.  · You may become short of breath easily.  · Your belly button may stick out.  · A yellow discharge may leak from your breasts called colostrum.  · You may have a bloody mucus discharge. This usually occurs a few days to a week before labor begins.  HOME CARE INSTRUCTIONS   · Keep your caregiver's appointments. Follow your caregiver's instructions regarding medication use, exercise, and diet.  · During pregnancy, you are providing food for you and your baby. Continue to eat regular, well-balanced meals. Choose foods such as meat, fish, milk and other low fat dairy products, vegetables, fruits, and whole-grain breads and cereals. Your caregiver will tell you of the ideal weight gain.  · A physical sexual relationship may be continued throughout pregnancy if there are no other problems such as early (premature) leaking of amniotic fluid from the membranes, vaginal bleeding, or belly (abdominal) pain.  · Exercise regularly if there are no restrictions. Check with your caregiver if you are unsure of the safety of your exercises. Greater weight gain will occur in the last 2 trimesters of pregnancy. Exercising helps:  · Control your weight.  · Get you in shape for labor and delivery.  · You lose weight after you deliver.  · Rest a lot with legs elevated, or as needed for leg cramps or low back pain.  · Wear a good support or jogging bra for breast tenderness during pregnancy. This may help if worn during  sleep. Pads or tissues may be used in the bra if you are leaking colostrum.  · Do not use hot tubs, steam rooms, or saunas.  · Wear your seat belt when driving. This protects you and your baby if you are in an accident.  · Avoid raw meat, cat litter boxes and soil used by cats. These carry germs that can cause birth defects in the baby.  · It is easier to loose urine during pregnancy. Tightening up and strengthening the pelvic muscles will help with this problem. You can practice stopping your urination while you are going to the bathroom. These are the same muscles you need to strengthen. It is also the muscles you would use if you were trying to stop from passing gas. You can practice tightening these muscles up 10 times a set and repeating this about 3 times per day. Once you know what muscles to tighten up, do not perform these   exercises during urination. It is more likely to cause an infection by backing up the urine.  · Ask for help if you have financial, counseling or nutritional needs during pregnancy. Your caregiver will be able to offer counseling for these needs as well as refer you for other special needs.  · Make a list of emergency phone numbers and have them available.  · Plan on getting help from family or friends when you go home from the hospital.  · Make a trial run to the hospital.  · Take prenatal classes with the father to understand, practice and ask questions about the labor and delivery.  · Prepare the baby's room/nursery.  · Do not travel out of the city unless it is absolutely necessary and with the advice of your caregiver.  · Wear only low or no heal shoes to have better balance and prevent falling.  MEDICATIONS AND DRUG USE IN PREGNANCY  · Take prenatal vitamins as directed. The vitamin should contain 1 milligram of folic acid. Keep all vitamins out of reach of children. Only a couple vitamins or tablets containing iron may be fatal to a baby or young child when ingested.  · Avoid use  of all medications, including herbs, over-the-counter medications, not prescribed or suggested by your caregiver. Only take over-the-counter or prescription medicines for pain, discomfort, or fever as directed by your caregiver. Do not use aspirin, ibuprofen (Motrin®, Advil®, Nuprin®) or naproxen (Aleve®) unless OK'd by your caregiver.  · Let your caregiver also know about herbs you may be using.  · Alcohol is related to a number of birth defects. This includes fetal alcohol syndrome. All alcohol, in any form, should be avoided completely. Smoking will cause low birth rate and premature babies.  · Street/illegal drugs are very harmful to the baby. They are absolutely forbidden. A baby born to an addicted mother will be addicted at birth. The baby will go through the same withdrawal an adult does.  SEEK MEDICAL CARE IF:  You have any concerns or worries during your pregnancy. It is better to call with your questions if you feel they cannot wait, rather than worry about them.  DECISIONS ABOUT CIRCUMCISION  You may or may not know the sex of your baby. If you know your baby is a boy, it may be time to think about circumcision. Circumcision is the removal of the foreskin of the penis. This is the skin that covers the sensitive end of the penis. There is no proven medical need for this. Often this decision is made on what is popular at the time or based upon religious beliefs and social issues. You can discuss these issues with your caregiver or pediatrician.  SEEK IMMEDIATE MEDICAL CARE IF:   · An unexplained oral temperature above 102° F (38.9° C) develops, or as your caregiver suggests.  · You have leaking of fluid from the vagina (birth canal). If leaking membranes are suspected, take your temperature and tell your caregiver of this when you call.  · There is vaginal spotting, bleeding or passing clots. Tell your caregiver of the amount and how many pads are used.  · You develop a bad smelling vaginal discharge with  a change in the color from clear to white.  · You develop vomiting that lasts more than 24 hours.  · You develop chills or fever.  · You develop shortness of breath.  · You develop burning on urination.  · You loose more than 2 pounds of weight   or gain more than 2 pounds of weight or as suggested by your caregiver.  · You notice sudden swelling of your face, hands, and feet or legs.  · You develop belly (abdominal) pain. Round ligament discomfort is a common non-cancerous (benign) cause of abdominal pain in pregnancy. Your caregiver still must evaluate you.  · You develop a severe headache that does not go away.  · You develop visual problems, blurred or double vision.  · If you have not felt your baby move for more than 1 hour. If you think the baby is not moving as much as usual, eat something with sugar in it and lie down on your left side for an hour. The baby should move at least 4 to 5 times per hour. Call right away if your baby moves less than that.  · You fall, are in a car accident or any kind of trauma.  · There is mental or physical violence at home.  Document Released: 03/03/2001 Document Revised: 06/01/2011 Document Reviewed: 09/05/2008  [employer]® Patient Information ©2013 [employer].

## 2012-04-18 NOTE — Progress Notes (Signed)
[id number] 

## 2012-04-18 NOTE — Progress Notes (Signed)
U/S scheduled 04/20/12 at 845 am.

## 2012-04-18 NOTE — Progress Notes (Signed)
Irregular contractions, mild. Marginal cord insertion- f/u ultrasound ordered for growth (normal 03/24/12). Wt gain total 9 lbs. GBS and GC/Chl done today.

## 2012-04-20 ENCOUNTER — Ambulatory Visit (HOSPITAL_COMMUNITY)
Admission: RE | Admit: 2012-04-20 | Discharge: 2012-04-20 | Disposition: A | Discharge status: Home / Self Care | Payer: BC Managed Care – PPO | Source: Ambulatory Visit | Attending: Family Medicine | Admitting: Family Medicine

## 2012-04-20 DIAGNOSIS — IMO0002 Reserved for concepts with insufficient information to code with codable children: Secondary | ICD-10-CM

## 2012-04-20 DIAGNOSIS — O99810 Abnormal glucose complicating pregnancy: Secondary | ICD-10-CM | POA: Insufficient documentation

## 2012-04-20 DIAGNOSIS — O2620 Pregnancy care for patient with recurrent pregnancy loss, unspecified trimester: Secondary | ICD-10-CM | POA: Insufficient documentation

## 2012-04-20 DIAGNOSIS — E669 Obesity, unspecified: Secondary | ICD-10-CM | POA: Insufficient documentation

## 2012-04-21 ENCOUNTER — Encounter: Payer: Self-pay | Admitting: Family Medicine

## 2012-04-22 LAB — CULTURE, BETA STREP (GROUP B ONLY)

## 2012-04-25 ENCOUNTER — Ambulatory Visit (INDEPENDENT_AMBULATORY_CARE_PROVIDER_SITE_OTHER): Payer: BC Managed Care – PPO | Admitting: Obstetrics and Gynecology

## 2012-04-25 DIAGNOSIS — O3660X0 Maternal care for excessive fetal growth, unspecified trimester, not applicable or unspecified: Secondary | ICD-10-CM

## 2012-04-25 LAB — POCT URINALYSIS DIP (DEVICE)
Bilirubin Urine: NEGATIVE
Glucose, UA: NEGATIVE mg/dL
Nitrite: NEGATIVE

## 2012-04-25 NOTE — Progress Notes (Signed)
[place] at [redacted]w[redacted]d >90th, AC>97th, AFI 16. RCBG  Advised no sweets. GBS next. Discussed Depo.

## 2012-04-25 NOTE — Progress Notes (Signed)
Random 121. Check fasting CBG next visit.

## 2012-04-25 NOTE — Patient Instructions (Signed)
Guía de planeamiento de la alimentación para diabéticos  (Diabetes Meal Planning Guide)  La guía de planeamiento de alimentación para diabéticos es una herramienta para ayudarlo a planear sus comidas y colaciones. Es importante para las personas con diabetes controlar sus niveles de azúcar. Elegir los alimentos correctos y las cantidades adecuadas durante el día le ayudará a controlar el azúcar en sangre. Comer bien puede incluso ayudarlo a mejorar la presión sanguínea y alcanzar o mantener un peso saludable.  CUENTE LOS HIDRATOS DE CARBONO CON FACILIDAD  Cuando consume hidratos de carbono, éstos se transforman en azúcar (glucosa). Esto a su vez aumenta el nivel de azúcar en sangre. El conteo de carbohidratos puede ayudarlo a controlar este nivel para que se sienta mejor. Al planear sus alimentos con el conteo de carbohidratos, podrá tener más flexibilidad en lo que come y equilibrar la medicación con el consumo de alimentos.  El conteo de carbohidratos significa simplemente sumar la cantidad total de gramos de carbohidratos a sus comidas o colaciones. Trate de consumir la misma cantidad en cada comida. A continuación encontrará una lista de 1 porción o 15 gr. de carbohidratos. A continuación se enumeran. Pregunte al médico cuántos gramos de carbohidratos necesita comer en cada comida o colación.  Almidones y granos  · 1 rebanada de pan.  · ½ bollo inglés o bollo para hamburguesa o hotdog.  · ¾ taza de cereal frío (sin azúcar).  · ? taza de pasta o arroz cocido.  · ½ taza de vegetales que contengan almidón (maíz, papas, arvejas, porotos, calabaza).  · 1 omelette (6 pulgadas).  · ¼ bollo.  · 1 waffle o panqueque (del tamaño de un CD).  · ½ taza de cereales cocidos.  · 4 a 6 galletas saldas pequeñas.  *Se recomienda el consumo de granos enteros.  Frutas  · 1 taza de frutos rojos, melón, papaya o ananá sin azúcar.  · 1 fruta fresca pequeña.  · ½ banana o mango.  · ½ taza de jugo de frutas (4 onzas sin endulzar).  · ½  taza de fruta envasada en jugo natural o agua.  · 2 cucharadas de frutas secas.  · 12-15 uvas o cerezas.  Leche y yogurt  · 1 taza de leche descremada o al 1%.  · 1 taza de leche de soja.  · 6 onzas de yogurt descremado con edulcorante sin azúcar.  · 6 onzas de yogur descremado de soja.  · 6 onzas de yogur natural.  Vegetales  · 1 taza de vegetales crudos o ½ de vegetales cocidos se considera cero carbohidratos o una comida "libre".  · Si come 3 o más porciones en una comida, cuéntelas como 1 porción de carbohidratos.  Otros carbohidratos  · ¾ onzas de chips o pretzels.  · ½ taza de helado de crema o yogur helado.  · ¼ taza de helado de agua.  · 5 cm de torta no congelada.  · 1 cucharada de miel, azúcar, mermelada, jalea o almíbar.  · 2 galletitas dulces pequeñas.  · 3 cuadrados de crackers de graham.  · 3 tazas de palomitas de maíz.  · 6 crackers.  · 1 taza de caldo.  · Cuente 1 taza de guisado u otra mezcla de alimentos como 2 porciones de carbohidratos.  · Los alimentos con menos de 20 calorías por porción deben contarse como cero carbohidratos o alimento "libre".  Si lo desea compre un libro o software de computación que enumere la cantidad de gramos de carbohidratos de   los diferentes alimentos. Además, el panel nutricional en las etiquetas de los productos que consume es una buena fuente de información. Le indicará el tamaño de la porción y la cantidad total de carbohidratos que consumirá por cada una. Divida este número por 15 para obtener el número de conteo de carbohidratos por porción. Recuerde: cada porción son 15 gramos de carbohidratos.  PORCIONES  La medición de los alimentos y el tamaño de las porciones lo ayudarán a controlar la cantidad exacta de comida que debe ingerir. La lista que sigue le mostrará el tamaño de algunas porciones comunes.   · 1 onza.................4 dados apilados.  · 3 onzas...............Un mazo de cartas.  · 1 cucharadita.....La punta de un dedo pequeño.  · 1  cucharada........Un dedo.  · 2 cucharadas......Una pelota de golf.  · ½ taza...............La mitad de un puño.  · 1 taza................Un puño.  EJEMPLO DE PLAN DE ALIMENTACIÓN PARA DIABÉTICOS:  A continuación se muestra un ejemplo de plan de alimentación que incluye comidas de los grupos de granos y féculas, vegetales, frutas y carnes. Un nutricionista podrá confeccionarle un plan individualizado para cubrir sus necesidades calóricas y decirle el número de porciones que necesita de cada grupo. Sin embargo, podría intercambiar los alimentos que contengan carbohidratos (lácteos, cereales y frutas). Controlar la cantidad total de carbohidratos en los alimentos o colaciones es más importante que asegurarse de incluir todos los grupos alimenticios cada vez que come.   El siguiente plan de alimentación es un ejemplo de una dieta de 2000 calorías mediante el conteo de carbohidratos. Este plan contiene 17 porciones de carbohidratos.  Desayuno  · 1 taza de avena (2 porciones de carbohidratos).  · ¾ taza de yogur light(1 porción de carbohidratos).  · 1 taza de arándanos (1 porción de carbohidratos).  · ¼ taza de almendras.  Colación  · 1 manzana grande (2 porciones de carbohidratos).  · 1 palito de queso bajo en grasa.  Almuerzo  · Ensalada de pechuga de pollo.  · 1 taza de espinacas.  · ¼ taza de tomates cortados.  · 2 oz (60 gr) de pechuga de pollo en rebanadas.  · 2 cucharadas de aderezo italiano bajo en contenido graso.  · 12 galletas integrales (2 porciones de carbohidratos).  · 12 a 15 uvas (1 porción de carbohidratos).  · 1 taza de leche descremada (1porción de carbohidratos).  Colación  · 1 taza de zanahorias.  · ½ taza de puré de garbanzos (1 porción de carbohidratos).  Cena  · 3 oz (80 gr) de salmón a la parrilla.  · 1 taza de arroz integral (3 porciones de carbohidratos).  Colación  · 1 ½ taza de brócoli al vapor (1 porción de carbohidrato) con una cucharadita de aceite de oliva y jugo de limón.  · 1 taza de  budín light (2 porciones de carbohidratos).  HOJA DE PLANEAMIENTO DE LA ALIMENTACIÓN:  El dietista podrá utilizar esta hoja para ayudarlo a decidir cuántas porciones y qué tipos de alimentos son los adecuados para usted.   DESAYUNO  Grupo de alimentos y porciones / Alimento elegido  Granos/Féculas_________________________________________________  Lácteos________________________________________________________  Vegetales ______________________________________________________  Fruta __________________________________________________________  Carnes _________________________________________________________  Grasas _________________________________________________________  ALMUERZO  Grupo de alimentos y porciones / Alimento elegido  Granos/Féculas___________________________________________________  Lácteos_________________________________________________________  Fruta ___________________________________________________________  Carnes __________________________________________________________  Grasas __________________________________________________________  CENA  Grupo de alimentos y porciones / Alimento elegido  Granos/Féculas___________________________________________________  Lácteos_________________________________________________________  Fruta ___________________________________________________________  Carnes __________________________________________________________  Grasas __________________________________________________________  COLACIÓN  Grupo de alimentos y porciones /

## 2012-05-02 ENCOUNTER — Ambulatory Visit (INDEPENDENT_AMBULATORY_CARE_PROVIDER_SITE_OTHER): Payer: BC Managed Care – PPO | Admitting: Family Medicine

## 2012-05-02 VITALS — BP 104/69 | Temp 97.9°F | Wt 192.8 lb

## 2012-05-02 DIAGNOSIS — O0990 Supervision of high risk pregnancy, unspecified, unspecified trimester: Secondary | ICD-10-CM

## 2012-05-02 DIAGNOSIS — O3660X0 Maternal care for excessive fetal growth, unspecified trimester, not applicable or unspecified: Secondary | ICD-10-CM

## 2012-05-02 DIAGNOSIS — O24419 Gestational diabetes mellitus in pregnancy, unspecified control: Secondary | ICD-10-CM

## 2012-05-02 DIAGNOSIS — O99810 Abnormal glucose complicating pregnancy: Secondary | ICD-10-CM

## 2012-05-02 NOTE — Progress Notes (Signed)
No complaints. Irregular contractions. Cervix 3 cm.

## 2012-05-02 NOTE — Progress Notes (Signed)
Pulse- 95 Patient reports pain/pressure in lower abdominal/pelvis

## 2012-05-02 NOTE — Patient Instructions (Addendum)
Pregnancy - Third Trimester  The third trimester of pregnancy (the last 3 months) is a period of the most rapid growth for you and your baby. The baby approaches a length of 20 inches and a weight of 6 to 10 pounds. The baby is adding on fat and getting ready for life outside your body. While inside, babies have periods of sleeping and waking, suck their thumbs, and hiccups. You can often feel small contractions of the uterus. This is false labor. It is also called Braxton-Hicks contractions. This is like a practice for labor. The usual problems in this stage of pregnancy include more difficulty breathing, swelling of the hands and feet from water retention, and having to urinate more often because of the uterus and baby pressing on your bladder.   PRENATAL EXAMS  · Blood work may continue to be done during prenatal exams. These tests are done to check on your health and the probable health of your baby. Blood work is used to follow your blood levels (hemoglobin). Anemia (low hemoglobin) is common during pregnancy. Iron and vitamins are given to help prevent this. You may also continue to be checked for diabetes. Some of the past blood tests may be done again.  · The size of the uterus is measured during each visit. This makes sure your baby is growing properly according to your pregnancy dates.  · Your blood pressure is checked every prenatal visit. This is to make sure you are not getting toxemia.  · Your urine is checked every prenatal visit for infection, diabetes and protein.  · Your weight is checked at each visit. This is done to make sure gains are happening at the suggested rate and that you and your baby are growing normally.  · Sometimes, an ultrasound is performed to confirm the position and the proper growth and development of the baby. This is a test done that bounces harmless sound waves off the baby so your caregiver can more accurately determine due dates.  · Discuss the type of pain medication and  anesthesia you will have during your labor and delivery.  · Discuss the possibility and anesthesia if a Cesarean Section might be necessary.  · Inform your caregiver if there is any mental or physical violence at home.  Sometimes, a specialized non-stress test, contraction stress test and biophysical profile are done to make sure the baby is not having a problem. Checking the amniotic fluid surrounding the baby is called an amniocentesis. The amniotic fluid is removed by sticking a needle into the belly (abdomen). This is sometimes done near the end of pregnancy if an early delivery is required. In this case, it is done to help make sure the baby's lungs are mature enough for the baby to live outside of the womb. If the lungs are not mature and it is unsafe to deliver the baby, an injection of cortisone medication is given to the mother 1 to 2 days before the delivery. This helps the baby's lungs mature and makes it safer to deliver the baby.  CHANGES OCCURING IN THE THIRD TRIMESTER OF PREGNANCY  Your body goes through many changes during pregnancy. They vary from person to person. Talk to your caregiver about changes you notice and are concerned about.  · During the last trimester, you have probably had an increase in your appetite. It is normal to have cravings for certain foods. This varies from person to person and pregnancy to pregnancy.  · You may begin to   get stretch marks on your hips, abdomen, and breasts. These are normal changes in the body during pregnancy. There are no exercises or medications to take which prevent this change.  · Constipation may be treated with a stool softener or adding bulk to your diet. Drinking lots of fluids, fiber in vegetables, fruits, and whole grains are helpful.  · Exercising is also helpful. If you have been very active up until your pregnancy, most of these activities can be continued during your pregnancy. If you have been less active, it is helpful to start an exercise  program such as walking. Consult your caregiver before starting exercise programs.  · Avoid all smoking, alcohol, un-prescribed drugs, herbs and "street drugs" during your pregnancy. These chemicals affect the formation and growth of the baby. Avoid chemicals throughout the pregnancy to ensure the delivery of a healthy infant.  · Backache, varicose veins and hemorrhoids may develop or get worse.  · You will tire more easily in the third trimester, which is normal.  · The baby's movements may be stronger and more often.  · You may become short of breath easily.  · Your belly button may stick out.  · A yellow discharge may leak from your breasts called colostrum.  · You may have a bloody mucus discharge. This usually occurs a few days to a week before labor begins.  HOME CARE INSTRUCTIONS   · Keep your caregiver's appointments. Follow your caregiver's instructions regarding medication use, exercise, and diet.  · During pregnancy, you are providing food for you and your baby. Continue to eat regular, well-balanced meals. Choose foods such as meat, fish, milk and other low fat dairy products, vegetables, fruits, and whole-grain breads and cereals. Your caregiver will tell you of the ideal weight gain.  · A physical sexual relationship may be continued throughout pregnancy if there are no other problems such as early (premature) leaking of amniotic fluid from the membranes, vaginal bleeding, or belly (abdominal) pain.  · Exercise regularly if there are no restrictions. Check with your caregiver if you are unsure of the safety of your exercises. Greater weight gain will occur in the last 2 trimesters of pregnancy. Exercising helps:  · Control your weight.  · Get you in shape for labor and delivery.  · You lose weight after you deliver.  · Rest a lot with legs elevated, or as needed for leg cramps or low back pain.  · Wear a good support or jogging bra for breast tenderness during pregnancy. This may help if worn during  sleep. Pads or tissues may be used in the bra if you are leaking colostrum.  · Do not use hot tubs, steam rooms, or saunas.  · Wear your seat belt when driving. This protects you and your baby if you are in an accident.  · Avoid raw meat, cat litter boxes and soil used by cats. These carry germs that can cause birth defects in the baby.  · It is easier to loose urine during pregnancy. Tightening up and strengthening the pelvic muscles will help with this problem. You can practice stopping your urination while you are going to the bathroom. These are the same muscles you need to strengthen. It is also the muscles you would use if you were trying to stop from passing gas. You can practice tightening these muscles up 10 times a set and repeating this about 3 times per day. Once you know what muscles to tighten up, do not perform these   exercises during urination. It is more likely to cause an infection by backing up the urine.  · Ask for help if you have financial, counseling or nutritional needs during pregnancy. Your caregiver will be able to offer counseling for these needs as well as refer you for other special needs.  · Make a list of emergency phone numbers and have them available.  · Plan on getting help from family or friends when you go home from the hospital.  · Make a trial run to the hospital.  · Take prenatal classes with the father to understand, practice and ask questions about the labor and delivery.  · Prepare the baby's room/nursery.  · Do not travel out of the city unless it is absolutely necessary and with the advice of your caregiver.  · Wear only low or no heal shoes to have better balance and prevent falling.  MEDICATIONS AND DRUG USE IN PREGNANCY  · Take prenatal vitamins as directed. The vitamin should contain 1 milligram of folic acid. Keep all vitamins out of reach of children. Only a couple vitamins or tablets containing iron may be fatal to a baby or young child when ingested.  · Avoid use  of all medications, including herbs, over-the-counter medications, not prescribed or suggested by your caregiver. Only take over-the-counter or prescription medicines for pain, discomfort, or fever as directed by your caregiver. Do not use aspirin, ibuprofen (Motrin®, Advil®, Nuprin®) or naproxen (Aleve®) unless OK'd by your caregiver.  · Let your caregiver also know about herbs you may be using.  · Alcohol is related to a number of birth defects. This includes fetal alcohol syndrome. All alcohol, in any form, should be avoided completely. Smoking will cause low birth rate and premature babies.  · Street/illegal drugs are very harmful to the baby. They are absolutely forbidden. A baby born to an addicted mother will be addicted at birth. The baby will go through the same withdrawal an adult does.  SEEK MEDICAL CARE IF:  You have any concerns or worries during your pregnancy. It is better to call with your questions if you feel they cannot wait, rather than worry about them.  DECISIONS ABOUT CIRCUMCISION  You may or may not know the sex of your baby. If you know your baby is a boy, it may be time to think about circumcision. Circumcision is the removal of the foreskin of the penis. This is the skin that covers the sensitive end of the penis. There is no proven medical need for this. Often this decision is made on what is popular at the time or based upon religious beliefs and social issues. You can discuss these issues with your caregiver or pediatrician.  SEEK IMMEDIATE MEDICAL CARE IF:   · An unexplained oral temperature above 102° F (38.9° C) develops, or as your caregiver suggests.  · You have leaking of fluid from the vagina (birth canal). If leaking membranes are suspected, take your temperature and tell your caregiver of this when you call.  · There is vaginal spotting, bleeding or passing clots. Tell your caregiver of the amount and how many pads are used.  · You develop a bad smelling vaginal discharge with  a change in the color from clear to white.  · You develop vomiting that lasts more than 24 hours.  · You develop chills or fever.  · You develop shortness of breath.  · You develop burning on urination.  · You loose more than 2 pounds of weight   or gain more than 2 pounds of weight or as suggested by your caregiver.  · You notice sudden swelling of your face, hands, and feet or legs.  · You develop belly (abdominal) pain. Round ligament discomfort is a common non-cancerous (benign) cause of abdominal pain in pregnancy. Your caregiver still must evaluate you.  · You develop a severe headache that does not go away.  · You develop visual problems, blurred or double vision.  · If you have not felt your baby move for more than 1 hour. If you think the baby is not moving as much as usual, eat something with sugar in it and lie down on your left side for an hour. The baby should move at least 4 to 5 times per hour. Call right away if your baby moves less than that.  · You fall, are in a car accident or any kind of trauma.  · There is mental or physical violence at home.  Document Released: 03/03/2001 Document Revised: 06/01/2011 Document Reviewed: 09/05/2008  [employer]® Patient Information ©2013 [employer].

## 2012-05-04 ENCOUNTER — Encounter (HOSPITAL_COMMUNITY): Payer: Self-pay | Admitting: *Deleted

## 2012-05-04 ENCOUNTER — Inpatient Hospital Stay (HOSPITAL_COMMUNITY)
Admission: AD | Admit: 2012-05-04 | Discharge: 2012-05-04 | Disposition: A | Discharge status: Home / Self Care | Payer: BC Managed Care – PPO | Source: Ambulatory Visit | Attending: Obstetrics & Gynecology | Admitting: Obstetrics & Gynecology

## 2012-05-04 DIAGNOSIS — O479 False labor, unspecified: Secondary | ICD-10-CM | POA: Insufficient documentation

## 2012-05-04 NOTE — MAU Note (Signed)
Amber Branch in for triage. Patient states she is having contraction 10-11 minutes apart. Denies bleeding or leaking and reports good fetal movement.

## 2012-05-08 ENCOUNTER — Inpatient Hospital Stay (HOSPITAL_COMMUNITY)
Admission: AD | Admit: 2012-05-08 | Discharge: 2012-05-10 | DRG: 372 | Disposition: A | Discharge status: Home / Self Care | Payer: BC Managed Care – PPO | Source: Ambulatory Visit | Attending: Obstetrics & Gynecology | Admitting: Obstetrics & Gynecology

## 2012-05-08 ENCOUNTER — Inpatient Hospital Stay (HOSPITAL_COMMUNITY): Payer: BC Managed Care – PPO | Admitting: Anesthesiology

## 2012-05-08 ENCOUNTER — Encounter (HOSPITAL_COMMUNITY): Payer: Self-pay | Admitting: Anesthesiology

## 2012-05-08 ENCOUNTER — Encounter (HOSPITAL_COMMUNITY): Payer: Self-pay | Admitting: Obstetrics and Gynecology

## 2012-05-08 DIAGNOSIS — Z22330 Carrier of Group B streptococcus: Secondary | ICD-10-CM

## 2012-05-08 DIAGNOSIS — IMO0001 Reserved for inherently not codable concepts without codable children: Secondary | ICD-10-CM

## 2012-05-08 DIAGNOSIS — O3660X0 Maternal care for excessive fetal growth, unspecified trimester, not applicable or unspecified: Secondary | ICD-10-CM | POA: Diagnosis present

## 2012-05-08 DIAGNOSIS — O4290 Premature rupture of membranes, unspecified as to length of time between rupture and onset of labor, unspecified weeks of gestation: Secondary | ICD-10-CM | POA: Diagnosis not present

## 2012-05-08 DIAGNOSIS — O99892 Other specified diseases and conditions complicating childbirth: Secondary | ICD-10-CM | POA: Diagnosis present

## 2012-05-08 LAB — CBC
HCT: 39.6 % (ref 36.0–46.0)
MCH: 30.5 pg (ref 26.0–34.0)
MCV: 92.3 fL (ref 78.0–100.0)
RDW: 16.1 % — ABNORMAL HIGH (ref 11.5–15.5)
WBC: 6.6 10*3/uL (ref 4.0–10.5)

## 2012-05-08 LAB — POCT FERN TEST: POCT Fern Test: POSITIVE

## 2012-05-08 LAB — TYPE AND SCREEN

## 2012-05-08 MED ORDER — OXYTOCIN 40 UNITS IN LACTATED RINGERS INFUSION - SIMPLE MED
62.5000 mL/h | INTRAVENOUS | Status: DC
  Administered 2012-05-09 (×2): 62.5 mL/h via INTRAVENOUS

## 2012-05-08 MED ORDER — PHENYLEPHRINE 40 MCG/ML (10ML) SYRINGE FOR IV PUSH (FOR BLOOD PRESSURE SUPPORT)
80.0000 ug | PREFILLED_SYRINGE | INTRAVENOUS | Status: DC | PRN
  Administered 2012-05-09 (×2): 80 ug via INTRAVENOUS

## 2012-05-08 MED ORDER — LACTATED RINGERS IV SOLN: 500.0000 mL | Freq: Once | INTRAVENOUS | Status: DC

## 2012-05-08 MED ORDER — PENICILLIN G POTASSIUM 5000000 UNITS IJ SOLR
2.5000 10*6.[IU] | INTRAVENOUS | Status: DC
  Administered 2012-05-08 (×4): 2.5 10*6.[IU] via INTRAVENOUS
  Filled 2012-05-08 (×8): qty 2.5

## 2012-05-08 MED ORDER — PHENYLEPHRINE 40 MCG/ML (10ML) SYRINGE FOR IV PUSH (FOR BLOOD PRESSURE SUPPORT)
PREFILLED_SYRINGE | INTRAVENOUS | Status: AC
  Filled 2012-05-08: qty 5

## 2012-05-08 MED ORDER — IBUPROFEN 600 MG PO TABS
600.0000 mg | ORAL_TABLET | Freq: Four times a day (QID) | ORAL | Status: DC | PRN
  Administered 2012-05-09: 600 mg via ORAL
  Filled 2012-05-08: qty 1

## 2012-05-08 MED ORDER — CITRIC ACID-SODIUM CITRATE 334-500 MG/5ML PO SOLN: 30.0000 mL | ORAL | Status: DC | PRN

## 2012-05-08 MED ORDER — LIDOCAINE HCL (PF) 1 % IJ SOLN
30.0000 mL | INTRAMUSCULAR | Status: DC | PRN
  Filled 2012-05-08: qty 30

## 2012-05-08 MED ORDER — FLEET ENEMA 7-19 GM/118ML RE ENEM: 1.0000 | ENEMA | RECTAL | Status: DC | PRN

## 2012-05-08 MED ORDER — HYDROXYZINE HCL 50 MG PO TABS: 50.0000 mg | ORAL_TABLET | Freq: Four times a day (QID) | ORAL | Status: DC | PRN

## 2012-05-08 MED ORDER — EPHEDRINE 5 MG/ML INJ
10.0000 mg | INTRAVENOUS | Status: DC | PRN
  Filled 2012-05-08 (×2): qty 4

## 2012-05-08 MED ORDER — FENTANYL CITRATE 0.05 MG/ML IJ SOLN: 100.0000 ug | INTRAMUSCULAR | Status: DC | PRN

## 2012-05-08 MED ORDER — LACTATED RINGERS IV SOLN
INTRAVENOUS | Status: DC
  Administered 2012-05-08: 23:00:00 via INTRAUTERINE

## 2012-05-08 MED ORDER — FENTANYL 2.5 MCG/ML BUPIVACAINE 1/10 % EPIDURAL INFUSION (WH - ANES)
14.0000 mL/h | INTRAMUSCULAR | Status: DC
  Administered 2012-05-08 – 2012-05-09 (×2): 14 mL/h via EPIDURAL
  Filled 2012-05-08 (×2): qty 125

## 2012-05-08 MED ORDER — LACTATED RINGERS IV SOLN
500.0000 mL | INTRAVENOUS | Status: DC | PRN
  Administered 2012-05-08: 1000 mL via INTRAVENOUS
  Administered 2012-05-08 – 2012-05-09 (×2): 300 mL via INTRAVENOUS

## 2012-05-08 MED ORDER — LACTATED RINGERS IV SOLN
INTRAVENOUS | Status: DC
  Administered 2012-05-08 (×2): via INTRAVENOUS
  Administered 2012-05-08: 125 mL/h via INTRAVENOUS

## 2012-05-08 MED ORDER — EPHEDRINE 5 MG/ML INJ: 10.0000 mg | INTRAVENOUS | Status: DC | PRN

## 2012-05-08 MED ORDER — EPHEDRINE 5 MG/ML INJ
INTRAVENOUS | Status: AC
  Filled 2012-05-08: qty 4

## 2012-05-08 MED ORDER — PHENYLEPHRINE 40 MCG/ML (10ML) SYRINGE FOR IV PUSH (FOR BLOOD PRESSURE SUPPORT)
80.0000 ug | PREFILLED_SYRINGE | INTRAVENOUS | Status: DC | PRN
  Filled 2012-05-08 (×3): qty 5

## 2012-05-08 MED ORDER — TERBUTALINE SULFATE 1 MG/ML IJ SOLN: 0.2500 mg | Freq: Once | INTRAMUSCULAR | Status: AC | PRN

## 2012-05-08 MED ORDER — HYDROXYZINE HCL 50 MG/ML IM SOLN: 50.0000 mg | Freq: Four times a day (QID) | INTRAMUSCULAR | Status: DC | PRN

## 2012-05-08 MED ORDER — ACETAMINOPHEN 325 MG PO TABS: 650.0000 mg | ORAL_TABLET | ORAL | Status: DC | PRN

## 2012-05-08 MED ORDER — ONDANSETRON HCL 4 MG/2ML IJ SOLN: 4.0000 mg | Freq: Four times a day (QID) | INTRAMUSCULAR | Status: DC | PRN

## 2012-05-08 MED ORDER — PENICILLIN G POTASSIUM 5000000 UNITS IJ SOLR
5.0000 10*6.[IU] | Freq: Once | INTRAVENOUS | Status: AC
  Administered 2012-05-08: 5 10*6.[IU] via INTRAVENOUS
  Filled 2012-05-08: qty 5

## 2012-05-08 MED ORDER — FENTANYL 2.5 MCG/ML BUPIVACAINE 1/10 % EPIDURAL INFUSION (WH - ANES)
INTRAMUSCULAR | Status: AC
  Administered 2012-05-08: 14 mL/h via EPIDURAL
  Filled 2012-05-08: qty 125

## 2012-05-08 MED ORDER — DIPHENHYDRAMINE HCL 50 MG/ML IJ SOLN
12.5000 mg | INTRAMUSCULAR | Status: DC | PRN
  Administered 2012-05-08: 12.5 mg via INTRAVENOUS
  Filled 2012-05-08: qty 1

## 2012-05-08 MED ORDER — OXYTOCIN 40 UNITS IN LACTATED RINGERS INFUSION - SIMPLE MED: 1.0000 m[IU]/min | INTRAVENOUS | Status: DC

## 2012-05-08 MED ORDER — LIDOCAINE HCL (PF) 1 % IJ SOLN
INTRAMUSCULAR | Status: DC | PRN
  Administered 2012-05-08 (×2): 5 mL

## 2012-05-08 MED ORDER — OXYTOCIN BOLUS FROM INFUSION: 500.0000 mL | INTRAVENOUS | Status: DC

## 2012-05-08 MED ORDER — OXYTOCIN 40 UNITS IN LACTATED RINGERS INFUSION - SIMPLE MED
1.0000 m[IU]/min | INTRAVENOUS | Status: DC
  Administered 2012-05-08: 2 m[IU]/min via INTRAVENOUS
  Filled 2012-05-08: qty 1000

## 2012-05-08 NOTE — Progress Notes (Signed)
Patient ID: Amber Branch, female   DOB: 01-28-1981, 32 y.o.   MRN: [id number]  S:  Patient with regular contractions, feels pressure.  O:  Filed Vitals:   05/08/12 1833 05/08/12 1900 05/08/12 1931 05/08/12 2001  BP: 114/53 109/65 112/83 95/54  Pulse: 81 94 99 104  Temp:   98.1 F (36.7 C)   TempSrc:   Oral   Resp: 16 16 18 18   Height:      Weight:      SpO2:        Cervix:  9/100/0 Vertex confirmed on bedside sono  FHTs: variables resolved with repositioning   A/P 32 y.o. [id number] at [redacted]w[redacted]d with SROM x 16 hours. Contracting regularly  - Cont pitocin - Epidural  - PCN for GBS + - Anticipated SVD  Si Raider. Clinton Sawyer, MD, MBA 05/08/2012, 9:03 PM Family Medicine Resident, PGY-2

## 2012-05-08 NOTE — Anesthesia Procedure Notes (Signed)

## 2012-05-08 NOTE — Anesthesia Preprocedure Evaluation (Signed)

## 2012-05-08 NOTE — Progress Notes (Signed)
Amber Branch is a 32 y.o. [phone] at [redacted]w[redacted]d.   Subjective: Mild pelvic pressure and moderate back pain w/ UC's.   Objective: BP 102/67  Pulse 97  Temp(Src) 98.1 F (36.7 C) (Oral)  Resp 18  Ht 5' (1.524 m)  Wt 88.089 kg (194 lb 3.2 oz)  BMI 37.93 kg/m2  SpO2 96%  LMP 07/22/2011   Total I/O In: -  Out: 375 [Urine:375]  FHT:  FHR: 150 bpm, variability: moderate,  accelerations:  Present,  decelerations:  Present repetitive mild to severe variables. Improved w/ position changes. UC:   regular, every 2-4 minutes, moderate SVE:   Dilation: 10 Effacement (%): 100 Station: -1 Exam by:: Katrinka Blazing, CNM Possible OP  IUPC placed w/out difficulty.  Labs: Lab Results  Component Value Date   WBC 6.6 05/08/2012   HGB 13.1 05/08/2012   HCT 39.6 05/08/2012   MCV 92.3 05/08/2012   PLT 127* 05/08/2012    Assessment / Plan: Augmentation of labor, progressing slowly.  Labor: Slow progress, but likely R/T inadequate contractions and OP postion.  Preeclampsia:  NA Fetal Wellbeing:  Category II Pain Control:  Epidural I/D:  n/a Anticipated MOD:  NSVD Attempted pushing. Pt unable to feel to push. No descent w/ pushing. Little descent w/ UC's. Adequate pelvis.   Amnioinfusion, Restart pitocin at 3 milliUnits/min.  Dr. Despina Hidden notified of variables, interventions. Agrees w/ POC.  Amber Branch 05/08/2012, 11:21 PM

## 2012-05-08 NOTE — Progress Notes (Signed)
Patient ID: Amber Branch, female   DOB: [birth year], 32 y.o.   MRN: [id number]  S:  Pt feeling irregular ctx, somewhat stronger than previously.  OCeasar Mons Vitals:   05/08/12 0754 05/08/12 0924 05/08/12 1105 05/08/12 1154  BP: 96/72 102/57 110/60 114/67  Pulse: 75 77 80 71  Temp: 97.4 F (36.3 C) 98 F (36.7 C) 98.4 F (36.9 C)   TempSrc: Oral Oral Oral   Resp:  16 18 18   Height:      Weight:      SpO2:        Cervix:  4/70/-3, very high Vertex confirmed on bedside sono  FHTs: Baseline 130, moderate variability, accels present, no decels TOCO:  q 8-10 minutes  A/P 32 y.o. [id number] at [redacted]w[redacted]d with PROM x 7 hours. Contracting irregularly, has made some cervical change but still very high.  - Start pitocin - Epidural when desired - Anticipated SVD  Napoleon Form, MD

## 2012-05-08 NOTE — H&P (Signed)
Amber Branch is a 32 y.o. female presenting for rupture of membranes and contractions. Gush of fluid at about 5:30 AM this morning. Contractions since last night, moderate in strength. Baby moving well. No bleeding.   Pt is a [facility] patient for abnl initial one hour GTT and 4 SABs. 3 hr GTT was normal except for one elevated value. BS were normal while checking but stopped checking after a few weeks. 28 week A1C was 5.3. Marginal cord insertion followed with serial u/s for growth. Last was 1/29 - EFW 7# 13 oz, >90%.  Has had 8# babies vaginally with no problems.   Maternal Medical History:  Reason for admission: Rupture of membranes and contractions.  Nausea.  Contractions: Onset was 13-24 hours ago.   Frequency: regular.   Perceived severity is moderate.    Fetal activity: Perceived fetal activity is normal.   Last perceived fetal movement was within the past hour.    Prenatal complications: Elevated one hour GTT, normal 3 hour GTT with exception of one abnormal value. Marginal insertion of cord. LGA baby.  Prenatal Complications - Diabetes: none. Elevated 1 hour, one value elevated on 3 hour GTT, A1C at 28 weeks 5.3.  OB History   Grav Para Term Preterm Abortions TAB SAB Ect Mult Living   8 3 3  4  4   3      Past Medical History  Diagnosis Date  . No pertinent past medical history    Past Surgical History  Procedure Laterality Date  . No past surgeries     Family History: family history is negative for Anesthesia problems, and Hypotension, and Malignant hyperthermia, and Pseudochol deficiency, and Other, and Alcohol abuse, and Arthritis, and Asthma, and Birth defects, and Cancer, and COPD, and Depression, and Diabetes, and Drug abuse, and Early death, and Hearing loss, and Heart disease, and Hyperlipidemia, and Hypertension, and Kidney disease, and Learning disabilities, and Mental illness, and Mental retardation, and Miscarriages / Stillbirths, and Stroke, and Vision loss,  . Social History:  reports that she has never smoked. She has never used smokeless tobacco. She reports that she does not drink alcohol or use illicit drugs.   Prenatal Transfer Tool  Maternal Diabetes: No Genetic Screening: Declined Maternal Ultrasounds/Referrals: Abnormal:  Findings:   Other: Fetal Ultrasounds or other Referrals:  None Maternal Substance Abuse:  No Significant Maternal Medications:  None Significant Maternal Lab Results:  Lab values include: Group B Strep positive Other Comments:  Elevated 1 hour GTT, 3 hr normal except one value, 28 week A1C 5.3 but LGA baby  Review of Systems  Constitutional: Negative for fever and chills.  Eyes: Negative for blurred vision and double vision.  Respiratory: Negative for shortness of breath.   Cardiovascular: Negative for chest pain.  Gastrointestinal: Negative for nausea, vomiting, diarrhea and constipation.  Genitourinary: Negative for dysuria.  Neurological: Negative for dizziness and headaches.    Dilation: 3.5 Effacement (%): 70 Station: -2 Exam by:: [name],RN Blood pressure 96/72, pulse 75, temperature 97.4 F (36.3 C), temperature source Oral, resp. rate 20, height 5' (1.524 m), weight 88.089 kg (194 lb 3.2 oz), last menstrual period 07/22/2011, SpO2 100.00%. Maternal Exam:  Uterine Assessment: Contraction strength is moderate.  Contraction frequency is regular.   Abdomen: Estimated fetal weight is 7 lb 13 oz on 04/20/12.   Fetal presentation: vertex  Introitus: Ferning test: positive.   Pelvis: adequate for delivery.   Cervix: Cervix evaluated by digital exam.     Fetal Exam Fetal Monitor  Review: Mode: ultrasound.   Baseline rate: 130.  Variability: moderate (6-25 bpm).   Pattern: accelerations present and no decelerations.    Fetal State Assessment: Category I - tracings are normal.     Physical Exam  Constitutional: She is oriented to person, place, and time. She appears well-developed and  well-nourished. No distress.  HENT:  Head: Normocephalic and atraumatic.  Eyes: Conjunctivae and EOM are normal.  Neck: Neck supple.  Cardiovascular: Normal rate, regular rhythm and normal heart sounds.   Respiratory: Effort normal and breath sounds normal. No respiratory distress.  GI: Soft. There is no tenderness. There is no rebound and no guarding.  Musculoskeletal: Normal range of motion. She exhibits no edema and no tenderness.  Neurological: She is alert and oriented to person, place, and time.  Skin: Skin is warm and dry.  Psychiatric: She has a normal mood and affect.    Prenatal labs: ABO, Rh: O/POS/-- (07/26 0915) Antibody: NEG (07/26 0915) Rubella: >500.0 (07/26 0915) RPR: NON REAC (11/25 1116)  HBsAg: NEGATIVE (07/26 0915)  HIV: NON REACTIVE (11/25 1116)  GBS: Positive (01/31 0000)   Assessment/Plan: 32 y.o. [id number] at [redacted]w[redacted]d here with SROM and contractions -  Will manage expectantly for now, start pitocin if needed - Epidural when ready - PCN for GBS prophylaxis - Anticipate SVD - LGA baby but proven pelvis to 8# 2oz  Napoleon Form 05/08/2012, 8:22 AM

## 2012-05-08 NOTE — MAU Note (Signed)
Contractions since 0500. Leaking fld since 0630 this am. Clear fld

## 2012-05-08 NOTE — Progress Notes (Signed)
I was present for the exam and agree with above. Plan amnioinfusion if variables worsen.  [place], [place] 05/08/2012 9:17 PM

## 2012-05-09 ENCOUNTER — Encounter (HOSPITAL_COMMUNITY): Payer: Self-pay | Admitting: *Deleted

## 2012-05-09 ENCOUNTER — Encounter: Payer: Self-pay | Admitting: Obstetrics & Gynecology

## 2012-05-09 DIAGNOSIS — O9989 Other specified diseases and conditions complicating pregnancy, childbirth and the puerperium: Secondary | ICD-10-CM

## 2012-05-09 DIAGNOSIS — O3660X0 Maternal care for excessive fetal growth, unspecified trimester, not applicable or unspecified: Secondary | ICD-10-CM

## 2012-05-09 DIAGNOSIS — O4290 Premature rupture of membranes, unspecified as to length of time between rupture and onset of labor, unspecified weeks of gestation: Secondary | ICD-10-CM

## 2012-05-09 MED ORDER — DIBUCAINE 1 % RE OINT: 1.0000 "application " | TOPICAL_OINTMENT | RECTAL | Status: DC | PRN

## 2012-05-09 MED ORDER — HYDROCODONE-ACETAMINOPHEN 5-325 MG PO TABS
1.0000 | ORAL_TABLET | ORAL | Status: DC | PRN
Start: 2012-05-09 — End: 2012-05-10
  Administered 2012-05-09 – 2012-05-10 (×5): 1 via ORAL
  Filled 2012-05-09 (×5): qty 1

## 2012-05-09 MED ORDER — LANOLIN HYDROUS EX OINT: TOPICAL_OINTMENT | CUTANEOUS | Status: DC | PRN

## 2012-05-09 MED ORDER — WITCH HAZEL-GLYCERIN EX PADS
1.0000 "application " | MEDICATED_PAD | CUTANEOUS | Status: DC | PRN
  Administered 2012-05-09: 1 via TOPICAL

## 2012-05-09 MED ORDER — IBUPROFEN 600 MG PO TABS
600.0000 mg | ORAL_TABLET | Freq: Four times a day (QID) | ORAL | Status: DC
  Administered 2012-05-09 – 2012-05-10 (×4): 600 mg via ORAL
  Filled 2012-05-09 (×5): qty 1

## 2012-05-09 MED ORDER — SIMETHICONE 80 MG PO CHEW: 80.0000 mg | CHEWABLE_TABLET | ORAL | Status: DC | PRN

## 2012-05-09 MED ORDER — ONDANSETRON HCL 4 MG/2ML IJ SOLN: 4.0000 mg | INTRAMUSCULAR | Status: DC | PRN

## 2012-05-09 MED ORDER — ONDANSETRON HCL 4 MG PO TABS: 4.0000 mg | ORAL_TABLET | ORAL | Status: DC | PRN

## 2012-05-09 MED ORDER — PRENATAL MULTIVITAMIN CH
1.0000 | ORAL_TABLET | Freq: Every day | ORAL | Status: DC
  Administered 2012-05-09 – 2012-05-10 (×2): 1 via ORAL
  Filled 2012-05-09 (×2): qty 1

## 2012-05-09 MED ORDER — MISOPROSTOL 200 MCG PO TABS
ORAL_TABLET | ORAL | Status: AC
  Filled 2012-05-09: qty 5

## 2012-05-09 MED ORDER — ZOLPIDEM TARTRATE 5 MG PO TABS: 5.0000 mg | ORAL_TABLET | Freq: Every evening | ORAL | Status: DC | PRN

## 2012-05-09 MED ORDER — BENZOCAINE-MENTHOL 20-0.5 % EX AERO
1.0000 "application " | INHALATION_SPRAY | CUTANEOUS | Status: DC | PRN
  Administered 2012-05-09: 1 via TOPICAL
  Filled 2012-05-09 (×2): qty 56

## 2012-05-09 MED ORDER — TETANUS-DIPHTH-ACELL PERTUSSIS 5-2.5-18.5 LF-MCG/0.5 IM SUSP: 0.5000 mL | Freq: Once | INTRAMUSCULAR | Status: DC

## 2012-05-09 MED ORDER — SODIUM BICARBONATE 8.4 % IV SOLN
INTRAVENOUS | Status: DC | PRN
  Administered 2012-05-09: 4 mL via EPIDURAL

## 2012-05-09 MED ORDER — SENNOSIDES-DOCUSATE SODIUM 8.6-50 MG PO TABS
2.0000 | ORAL_TABLET | Freq: Every day | ORAL | Status: DC
  Administered 2012-05-09: 2 via ORAL

## 2012-05-09 MED ORDER — DIPHENHYDRAMINE HCL 25 MG PO CAPS: 25.0000 mg | ORAL_CAPSULE | Freq: Four times a day (QID) | ORAL | Status: DC | PRN

## 2012-05-09 NOTE — Progress Notes (Signed)
Lynzie Cliburn is a 32 y.o. [id number] at [redacted]w[redacted]d.  Subjective: Moderate pelvic pressure, urge to push.  Objective: BP 91/46  Pulse 95  Temp(Src) 98.4 F (36.9 C) (Oral)  Resp 18  Ht 5' (1.524 m)  Wt 88.089 kg (194 lb 3.2 oz)  BMI 37.93 kg/m2  SpO2 97%  LMP 07/22/2011   Total I/O In: -  Out: 375 [Urine:375]  FHT:  FHR: 145 bpm, variability: moderate,  accelerations:  Present,  decelerations:  Present repetetive mild-moderate variables, few lates possibly due to hypotension post-redosing of epidural. Improved w/ Phenylephrine and bolus. UC:   regular, every 2-3 minutes on 4 milliUnits/min pitocin. MVU's 165-170 SVE:   Dilation: 10 Effacement (%): 100 Station: +1 Exam by:: Dorathy Kinsman, CNM  Labs: Lab Results  Component Value Date   WBC 6.6 05/08/2012   HGB 13.1 05/08/2012   HCT 39.6 05/08/2012   MCV 92.3 05/08/2012   PLT 127* 05/08/2012    Assessment / Plan: Augmentation of labor. Good descent since last exam.   Labor: Progressing on Pitocin. Start second stage.  Preeclampsia:  NA Fetal Wellbeing:  Category II Pain Control:  Epidural I/D:  n/a Anticipated MOD:  NSVD  Patric Buckhalter 05/09/2012, 2:15 AM

## 2012-05-09 NOTE — Anesthesia Postprocedure Evaluation (Signed)
  Anesthesia Post-op Note  Patient: Amber Branch  Procedure(s) Performed: * No procedures listed *  Patient Location: Mother/Baby  Anesthesia Type:Epidural  Level of Consciousness: awake, alert , oriented and patient cooperative  Airway and Oxygen Therapy: Patient Spontanous Breathing  Post-op Pain: mild  Post-op Assessment: Patient's Cardiovascular Status Stable, Respiratory Function Stable, Patent Airway, No signs of Nausea or vomiting, Adequate PO intake and Pain level controlled  Post-op Vital Signs: Reviewed and stable  Complications: No apparent anesthesia complications

## 2012-05-10 LAB — CBC
Hemoglobin: 9.9 g/dL — ABNORMAL LOW (ref 12.0–15.0)
MCHC: 32.6 g/dL (ref 30.0–36.0)
WBC: 6.6 10*3/uL (ref 4.0–10.5)

## 2012-05-10 NOTE — Progress Notes (Signed)
Post Partum Day 1  Subjective: no complaints, up ad lib, voiding, tolerating PO and + flatus  Patient would like discharge today if possible  Objective: Temp:  [97.6 F (36.4 C)-98.7 F (37.1 C)] 97.6 F (36.4 C) (02/18 0600) Pulse Rate:  [75-83] 83 (02/18 0600) Resp:  [18-20] 18 (02/18 0600) BP: (99-100)/(56-63) 99/56 mmHg (02/18 0600)   Physical Exam:  General: alert, cooperative and no distress Lochia: appropriate Uterine Fundus: firm Incision: n/a DVT Evaluation: No evidence of DVT seen on physical exam.   Recent Labs  05/08/12 0800 05/10/12 0520  HGB 13.1 9.9*  HCT 39.6 30.4*    Assessment/Plan: Discharge home, Breastfeeding and Contraception IUD   LOS: 2 days   [name] 05/10/2012, 7:12 AM

## 2012-05-10 NOTE — Discharge Summary (Signed)
Patient name: Amber Branch Medical record number: [id number] Date of birth: 03-Sep-1980 Age: 32 y.o. Gender: female  Overview: Amber Branch is a 32 y.o. [phone] presenting at [redacted]w[redacted]d with rupture of membranes and contractions. She was augmented with pitocin and progressed to have a spontaneous vaginal delivery without complications. Postpartum course was unremarkable. Patient is breastfeeding and plans IUD for contraception.  The patient had been followed in high risk clinic for an elevated GTT in the first trimester. 3 hour GTT was negative, subsequent blood sugar checks were normal, and A1C at 28 weeks was 5.3.  However, she had gestational diabetes in a prior pregnancy and a strong family history of DM. It is recommended that she have a 2 hour GTT at 6 weeks postpartum.  Subjective: Patient without complaints; requesting discharge today   Obstetric Discharge Summary Reason for Admission: onset of labor Intrapartum Procedures: spontaneous vaginal delivery Postpartum Procedures: 2nd degree perineal laceration repair Complications-Operative and Postpartum: none   Physical Exam:  General: alert, cooperative and no distress Lochia: appropriate Uterine Fundus: firm DVT Evaluation: No evidence of DVT seen on physical exam.  Discharge Diagnoses: Term Pregnancy-delivered  Discharge Information: Date: 05/10/2012 Activity: pelvic rest Diet: needs improved diet considering body habitus Medications: None Condition: stable Discharge to: home Feeding Preference: Breast and Bottle Contraception: Prefers IUD Follow Up Issues - Consider 2 hours glucose tolerance test give hx of hyperglycemia    Newborn Data: Live born female  Birth Weight: 8 lb 0.4 oz (3640 g) APGAR: 8, 9  Home with mother.  [name] 05/10/2012, 7:34 AM  I have seen and examined patient and agree with above resident note. I reviewed patient's prenatal records, H&P, progress notes, delivery summary and  labs. Patient to be discharged home with f/u in [facility].  Napoleon Form, MD

## 2012-05-11 NOTE — Progress Notes (Signed)
I saw and examined patient and agree with above student note. Patient to be discharged today. Napoleon Form, MD

## 2012-05-12 NOTE — Discharge Summary (Signed)
Attestation of Attending Supervision of Advanced Practitioner (CNM/NP): Evaluation and management procedures were performed by the Advanced Practitioner under my supervision and collaboration.  I have reviewed the Advanced Practitioner's note and chart, and I agree with the management and plan.  Caitlin Ainley 05/12/2012 11:52 AM   

## 2012-06-06 ENCOUNTER — Encounter: Payer: Self-pay | Admitting: Obstetrics and Gynecology

## 2012-06-06 ENCOUNTER — Ambulatory Visit (INDEPENDENT_AMBULATORY_CARE_PROVIDER_SITE_OTHER): Payer: BC Managed Care – PPO | Admitting: Obstetrics and Gynecology

## 2012-06-06 ENCOUNTER — Other Ambulatory Visit: Payer: Self-pay | Admitting: Obstetrics and Gynecology

## 2012-06-06 DIAGNOSIS — Z30430 Encounter for insertion of intrauterine contraceptive device: Secondary | ICD-10-CM

## 2012-06-06 LAB — POCT PREGNANCY, URINE: Preg Test, Ur: NEGATIVE

## 2012-06-06 MED ORDER — LEVONORGESTREL 20 MCG/24HR IU IUD
INTRAUTERINE_SYSTEM | Freq: Once | INTRAUTERINE | Status: AC
  Administered 2012-06-06: 1 via INTRAUTERINE

## 2012-06-06 NOTE — Patient Instructions (Signed)
Levonorgestrel intrauterine device (IUD) Qu es este medicamento? El LEVONORGESTREL (DIU) es un dispositivo anticonceptivo (control de natalidad). Se utiliza para evitar el embarazo durante un perodo de hasta 5 aos. Este medicamento puede ser utilizado para otros usos; si tiene alguna pregunta consulte con su proveedor de atencin mdica o con su farmacutico. Qu le debo informar a mi profesional de la salud antes de tomar este medicamento? Necesita saber si usted presenta alguno de los siguientes problemas o situaciones: -exmen de Papanicolaou anormal -cncer de mama, cuello del tero o tero -diabetes -endometritis -si tiene una infeccin plvica o genital actual o en el pasado -tiene ms de una pareja sexual o si su pareja tiene ms de una pareja -enfermedad cardiaca -antecedente de embarazo tubrico o ectpico -problemas del sistema inmunolgico -DIU colocado -enfermedad heptica o tumor del hgado -problemas con la coagulacin o si toma diluyentes sanguneos -usa medicamentos intravenoso -forma inusual del tero -sangrado vaginal que no tiene explicacin -una reaccin alrgica o inusual al levonorgestrel, a otras hormonas, a la silicona o polietilenos, a otros medicamentos, alimentos, colorantes o conservantes -si est embarazada o buscando quedar embarazada -si est amamantando a un beb Cmo debo utilizar este medicamento? Un profesional de la salud coloca este dispositivo en el tero. Hable con su pediatra para informarse acerca del uso de este medicamento en nios. Puede requerir atencin especial. Sobredosis: Pngase en contacto inmediatamente con un centro toxicolgico o una sala de urgencia si usted cree que haya tomado demasiado medicamento. ATENCIN: Este medicamento es solo para usted. No comparta este medicamento con nadie. Qu sucede si me olvido de una dosis? No se aplica en este caso. Qu puede interactuar con este medicamento? No tome esta medicina con  ninguno de los siguientes medicamentos: -amprenavir -bosentano -fosamprenavir Esta medicina tambin puede interactuar con los siguientes medicamentos: -aprepitant -barbitricos para producir el sueo o para el tratamiento de convulsiones -bexaroteno -griseofulvina -medicamentos para tratar los convulsiones, tales como carbamazepina, etotona, felbamato, oxcarbazepina, fenitona, topiramato -modafinilo -pioglitazona -rifabutina -rifampicina -rifapentina -algunos medicamentos para tratar el virus VIH, tales como atazanavir, indinavir, lopinavir, nelfinavir, tipranavir, ritonavir -hierba de [place] -warfarina Puede ser que esta lista no menciona todas las posibles interacciones. Informe a su profesional de la salud de todos los productos a base de hierbas, medicamentos de venta libre o suplementos nutritivos que est tomando. Si usted fuma, consume bebidas alcohlicas o si utiliza drogas ilegales, indqueselo tambin a su profesional de la salud. Algunas sustancias pueden interactuar con su medicamento. A qu debo estar atento al usar este medicamento? Visite a su mdico o a su profesional de la salud para chequear su evolucin peridicamente. Visite a su mdico si usted o su pareja tiene relaciones sexuales con otras personas, se vuelve VIH positivo o contrae una enfermedad de transmisin sexual. Este medicamento no la protege de la infeccin por VIH (SIDA) ni de ninguna otra enfermedad de transmisin sexual. Puede controlar la ubicacin del DIU usted misma palpando con sus dedos limpios los hilos en la parte anterior de la vagina. No tire de los hilos. Es un buen hbito controlar la ubicacin del dispositivo despus de cada perodo menstrual. Si no slo siente los hilos sino que adems siente otra parte ms del DIU o si no puede sentir los hilos, consulte a su mdico inmediatamente. El DIU puede salirse por s solo. Puede quedar embarazada si el dispositivo se sale de su lugar. Utilice un  mtodo anticonceptivo adicional, como preservativos, y consulte a su proveedor de atencin mdica s observa que   el DIU se sali de su lugar. La utilizacin de tampones no cambia la posicin del DIU y no hay inconvenientes en usarlos durante su perodo. Qu efectos secundarios puedo tener al utilizar este medicamento? Efectos secundarios que debe informar a su mdico o a su profesional de la salud tan pronto como sea posible: -reacciones alrgicas como erupcin cutnea, picazn o urticarias, hinchazn de la cara, labios o lengua -fiebre, sntomas gripales -llagas genitales -alta presin sangunea -ausencia de un perodo menstrual durante 6 semanas mientras lo utiliza -dolor, hinchazn o calor en las piernas -dolor o sensibilidad del plvico -dolor de cabeza repentino o severo -signos de embarazo -calambres estomacales -falta de aliento repentina -problemas de coordinacin, del habla, al caminar -sangrado, flujo vaginal inusual -color amarillento de los ojos o la piel Efectos secundarios que, por lo general, no requieren atencin mdica (debe informarlos a su mdico o a su profesional de la salud si persisten o si son molestos): -acn -dolor de pecho -cambios en el deseo sexual o capacidad -cambios de peso -calambres, mareos o sensacin de desmayo mientras se introduce el dispositivo -dolor de cabeza -sangrado menstruales irregulares en los primeros 3 a 6 meses de usar -nuseas Puede ser que esta lista no menciona todos los posibles efectos secundarios. Comunquese a su mdico por asesoramiento mdico sobre los efectos secundarios. Usted puede informar los efectos secundarios a la FDA por telfono al 1-800-FDA-1088. Dnde debo guardar mi medicina? No se aplica en este caso. ATENCIN: Este folleto es un resumen. Puede ser que no cubra toda la posible informacin. Si usted tiene preguntas acerca de esta medicina, consulte con su mdico, su farmacutico o su profesional de la salud.   2012, Elsevier/Gold Standard. (05/21/2008 3:17:12 PM) 

## 2012-06-06 NOTE — Progress Notes (Signed)
  Subjective:     Amber Branch is a 32 y.o. female [phone] who presents for a postpartum visit. Pregnancy was complicated by gestational diabetes. She is 4 weeks postpartum following a spontaneous vaginal delivery. I have fully reviewed the prenatal and intrapartum course. The delivery was at 39 gestational weeks. Outcome: spontaneous vaginal delivery. Anesthesia: epidural. Postpartum course has been uncomplicated. Baby's course has been uncomplicated. Baby is feeding by formula. Bleeding no bleeding. Bowel function is normal. Bladder function is normal. Patient is not sexually active. Contraception method is IUD. Postpartum depression screening: negative.  Does not work outside of the home. Lives with husband and her children.   Review of Systems A comprehensive review of systems was negative.   Objective:    BP 130/85  Pulse 63  Temp(Src) 96.9 F (36.1 C) (Oral)  Ht 5' (1.524 m)  Wt 169 lb 8 oz (76.885 kg)  BMI 33.1 kg/m2  Breastfeeding? No  General:  alert, cooperative and no distress   Breasts:  inspection negative, no nipple discharge or bleeding, no masses or nodularity palpable  Lungs: clear to auscultation bilaterally  Heart:  regular rate and rhythm, S1, S2 normal, no murmur, click, rub or gallop  Abdomen: soft, non-tender; bowel sounds normal; no masses,  no organomegaly   Vulva:  normal  Vagina: normal vagina, no discharge, exudate, lesion, or erythema  Cervix:  no lesions and nulliparous appearance  Corpus: normal size, contour, position, consistency, mobility, non-tender  Adnexa:  not evaluated  Rectal Exam: Not performed.        Informed consent obtained for Mirena placement with assistance of interpreter. UPT negative. Bimanual exam done. Speculum placed, cervix cleansed with Betadine, and cervix brought into view with tenaculum. Uterus sounded to 7.5cm. Mirena placed without difficulty and strings trimmed to 3cm. Minimal bleeding following insertion.  Assessment:    [id number] here for 4 week postpartum exam and IUD insertion.    Plan:    1. Contraception: IUD 2. Gestational diabetes-return in 4 weeks for 2 hour GTT 3. Follow up in: 4 weeks or as needed.   Evaluation and management procedures were performed by SNM under my supervision/collaboration. Chart reviewed, patient examined by me and I agree with management and plan.

## 2012-06-06 NOTE — Addendum Note (Signed)
Addended by: Sherre Lain A on: 06/06/2012 02:27 PM   Modules accepted: Orders

## 2012-06-15 ENCOUNTER — Encounter: Payer: Self-pay | Admitting: *Deleted

## 2012-06-21 ENCOUNTER — Other Ambulatory Visit: Payer: Self-pay

## 2012-06-22 ENCOUNTER — Other Ambulatory Visit: Payer: BC Managed Care – PPO

## 2012-06-22 DIAGNOSIS — O99814 Abnormal glucose complicating childbirth: Secondary | ICD-10-CM

## 2012-06-23 LAB — GLUCOSE TOLERANCE, 2 HOURS W/ 1HR: Glucose, Fasting: 85 mg/dL (ref 70–99)

## 2012-06-27 ENCOUNTER — Telehealth: Payer: Self-pay | Admitting: Obstetrics and Gynecology

## 2012-06-27 NOTE — Telephone Encounter (Addendum)
Message copied by Toula Moos on Mon Jun 27, 2012  4:37 PM   ------CALLED PATIENT; husband answered, left message for patient to call clinic back.           Message from: FERRY, [place]      Created: Mon Jun 27, 2012  6:43 AM       No diabetes postpartum. Normal 2 hour GTT. Please let pt know. Thanks; ------

## 2012-06-29 NOTE — Telephone Encounter (Signed)
Called and informed patient (via [facility]) of normal glucose test done on 06/22/12.  Patient had no question.

## 2012-06-29 NOTE — Telephone Encounter (Signed)
Called pt and left message that I am calling with test result from 4/2. Please call back to the nurse voice mail and state when you can be reached or if it is ok to leave details on your voice mail.

## 2012-07-11 ENCOUNTER — Ambulatory Visit (INDEPENDENT_AMBULATORY_CARE_PROVIDER_SITE_OTHER): Payer: BC Managed Care – PPO | Admitting: Obstetrics and Gynecology

## 2012-07-11 ENCOUNTER — Encounter: Payer: Self-pay | Admitting: Obstetrics and Gynecology

## 2012-07-11 ENCOUNTER — Encounter: Payer: Self-pay | Admitting: General Practice

## 2012-07-11 VITALS — BP 106/69 | HR 68 | Temp 97.6°F | Ht 60.0 in | Wt 170.4 lb

## 2012-07-11 DIAGNOSIS — T8339XA Other mechanical complication of intrauterine contraceptive device, initial encounter: Secondary | ICD-10-CM

## 2012-07-11 DIAGNOSIS — Z30431 Encounter for routine checking of intrauterine contraceptive device: Secondary | ICD-10-CM

## 2012-07-11 NOTE — Patient Instructions (Signed)
Levonorgestrel intrauterine device (IUD) Qu es este medicamento? El LEVONORGESTREL (DIU) es un dispositivo anticonceptivo (control de natalidad). Se utiliza para evitar el embarazo durante un perodo de hasta 5 aos. Este medicamento puede ser utilizado para otros usos; si tiene alguna pregunta consulte con su proveedor de atencin mdica o con su farmacutico. Qu le debo informar a mi profesional de la salud antes de tomar este medicamento? Necesita saber si usted presenta alguno de los siguientes problemas o situaciones: -exmen de Papanicolaou anormal -cncer de mama, cuello del tero o tero -diabetes -endometritis -si tiene una infeccin plvica o genital actual o en el pasado -tiene ms de una pareja sexual o si su pareja tiene ms de una pareja -enfermedad cardiaca -antecedente de embarazo tubrico o ectpico -problemas del sistema inmunolgico -DIU colocado -enfermedad heptica o tumor del hgado -problemas con la coagulacin o si toma diluyentes sanguneos -usa medicamentos intravenoso -forma inusual del tero -sangrado vaginal que no tiene explicacin -una reaccin alrgica o inusual al levonorgestrel, a otras hormonas, a la silicona o polietilenos, a otros medicamentos, alimentos, colorantes o conservantes -si est embarazada o buscando quedar embarazada -si est amamantando a un beb Cmo debo utilizar este medicamento? Un profesional de la salud coloca este dispositivo en el tero. Hable con su pediatra para informarse acerca del uso de este medicamento en nios. Puede requerir atencin especial. Sobredosis: Pngase en contacto inmediatamente con un centro toxicolgico o una sala de urgencia si usted cree que haya tomado demasiado medicamento. ATENCIN: Este medicamento es solo para usted. No comparta este medicamento con nadie. Qu sucede si me olvido de una dosis? No se aplica en este caso. Qu puede interactuar con este medicamento? No tome esta medicina con  ninguno de los siguientes medicamentos: -amprenavir -bosentano -fosamprenavir Esta medicina tambin puede interactuar con los siguientes medicamentos: -aprepitant -barbitricos para producir el sueo o para el tratamiento de convulsiones -bexaroteno -griseofulvina -medicamentos para tratar los convulsiones, tales como carbamazepina, etotona, felbamato, oxcarbazepina, fenitona, topiramato -modafinilo -pioglitazona -rifabutina -rifampicina -rifapentina -algunos medicamentos para tratar el virus VIH, tales como atazanavir, indinavir, lopinavir, nelfinavir, tipranavir, ritonavir -hierba de [place] -warfarina Puede ser que esta lista no menciona todas las posibles interacciones. Informe a su profesional de la salud de todos los productos a base de hierbas, medicamentos de venta libre o suplementos nutritivos que est tomando. Si usted fuma, consume bebidas alcohlicas o si utiliza drogas ilegales, indqueselo tambin a su profesional de la salud. Algunas sustancias pueden interactuar con su medicamento. A qu debo estar atento al usar este medicamento? Visite a su mdico o a su profesional de la salud para chequear su evolucin peridicamente. Visite a su mdico si usted o su pareja tiene relaciones sexuales con otras personas, se vuelve VIH positivo o contrae una enfermedad de transmisin sexual. Este medicamento no la protege de la infeccin por VIH (SIDA) ni de ninguna otra enfermedad de transmisin sexual. Puede controlar la ubicacin del DIU usted misma palpando con sus dedos limpios los hilos en la parte anterior de la vagina. No tire de los hilos. Es un buen hbito controlar la ubicacin del dispositivo despus de cada perodo menstrual. Si no slo siente los hilos sino que adems siente otra parte ms del DIU o si no puede sentir los hilos, consulte a su mdico inmediatamente. El DIU puede salirse por s solo. Puede quedar embarazada si el dispositivo se sale de su lugar. Utilice un  mtodo anticonceptivo adicional, como preservativos, y consulte a su proveedor de atencin mdica s observa que   el DIU se sali de su lugar. La utilizacin de tampones no cambia la posicin del DIU y no hay inconvenientes en usarlos durante su perodo. Qu efectos secundarios puedo tener al utilizar este medicamento? Efectos secundarios que debe informar a su mdico o a su profesional de la salud tan pronto como sea posible: -reacciones alrgicas como erupcin cutnea, picazn o urticarias, hinchazn de la cara, labios o lengua -fiebre, sntomas gripales -llagas genitales -alta presin sangunea -ausencia de un perodo menstrual durante 6 semanas mientras lo utiliza -dolor, hinchazn o calor en las piernas -dolor o sensibilidad del plvico -dolor de cabeza repentino o severo -signos de embarazo -calambres estomacales -falta de aliento repentina -problemas de coordinacin, del habla, al caminar -sangrado, flujo vaginal inusual -color amarillento de los ojos o la piel Efectos secundarios que, por lo general, no requieren atencin mdica (debe informarlos a su mdico o a su profesional de la salud si persisten o si son molestos): -acn -dolor de pecho -cambios en el deseo sexual o capacidad -cambios de peso -calambres, mareos o sensacin de desmayo mientras se introduce el dispositivo -dolor de cabeza -sangrado menstruales irregulares en los primeros 3 a 6 meses de usar -nuseas Puede ser que esta lista no menciona todos los posibles efectos secundarios. Comunquese a su mdico por asesoramiento mdico sobre los efectos secundarios. Usted puede informar los efectos secundarios a la FDA por telfono al 1-800-FDA-1088. Dnde debo guardar mi medicina? No se aplica en este caso. ATENCIN: Este folleto es un resumen. Puede ser que no cubra toda la posible informacin. Si usted tiene preguntas acerca de esta medicina, consulte con su mdico, su farmacutico o su profesional de la salud.   2013, Elsevier/Gold Standard. (05/21/2008 3:17:12 PM)  

## 2012-07-11 NOTE — Progress Notes (Signed)
SUBJECTIVE: 32 yo [id number] for IUD string check. Mirena placed here 06/05/12 and she has been bleeding or spotting on and off since then. She has discomfort with sex and partner can fell strings.  Postpartum NSVD 2 months ago. Baby thriving. Bottle feeding.  Hx GDM> 06/22/12 2 hr OGTT WNL.  OBJECTIVE: Filed Vitals:   07/11/12 1411  BP: 106/69  Pulse: 68  Temp: 97.6 F (36.4 C)    Gen: NAD Abd: soft, obese NT Pelvic: IUD strings 3+cm from ext os and cx midposition: both strings trimmed to 2 cm.  Multiparous cx not bleeding. No CMT or cx lesion.  Uterus NSSP, NT, No adnexal tenderness or massed.   ASSESSMENT/: IUD string check> strings clipped to reduce discomfort  PLAN: Return yearly for GYN exam or prn.

## 2012-07-26 ENCOUNTER — Other Ambulatory Visit: Payer: Self-pay | Admitting: Family Medicine

## 2012-07-26 DIAGNOSIS — L309 Dermatitis, unspecified: Secondary | ICD-10-CM

## 2012-07-26 MED ORDER — TRIAMCINOLONE ACETONIDE 0.5 % EX OINT: TOPICAL_OINTMENT | Freq: Two times a day (BID) | CUTANEOUS | Status: DC

## 2012-12-09 ENCOUNTER — Encounter: Payer: Self-pay | Admitting: Family Medicine

## 2012-12-09 ENCOUNTER — Ambulatory Visit (INDEPENDENT_AMBULATORY_CARE_PROVIDER_SITE_OTHER): Payer: BC Managed Care – PPO | Admitting: Family Medicine

## 2012-12-09 VITALS — BP 111/72 | HR 79 | Temp 98.0°F | Ht 60.0 in | Wt 182.8 lb

## 2012-12-09 DIAGNOSIS — M545 Low back pain: Secondary | ICD-10-CM

## 2012-12-09 MED ORDER — NAPROXEN 500 MG PO TABS: 500.0000 mg | ORAL_TABLET | Freq: Two times a day (BID) | ORAL | Status: DC

## 2012-12-09 MED ORDER — CYCLOBENZAPRINE HCL 10 MG PO TABS: 10.0000 mg | ORAL_TABLET | Freq: Every day | ORAL | Status: DC

## 2012-12-09 NOTE — Assessment & Plan Note (Signed)
Normal SLR bilat (Sept 19, 2014); no weakness or other radicular signs/sxs.  Muscle relaxant, NSAIDs, heat/cold.  Recheck in 2-4 weeks. JB

## 2012-12-09 NOTE — Progress Notes (Signed)
  Subjective:    Patient ID: Amber Branch, female    DOB: 1980/08/08, 32 y.o.   MRN: [id number]  HPI Visit conducted in Spanish.  Patient here for complaint of L leg pain that starts around the L buttock, radiates down the entire L left (anterior and posterior).  Worse if she's been sitting for a long time and needs to change to standing position, or when she gets out of the bed in the morning. "I walk like an old lady" until it gradually gets a little better but does not resolve. Does not feel like tingling; no observed motor weakness. No trauma or falls.  No urinary or bowel incontinence. Has been ongoing for the past 3 months or so.  Has not taken anything for the pain.   Delivered 7 months ago, did not have this pain while pregnant.  LMP 11/12/12.  Social Hx: Here with her seven-month daughter; never-smoker.    Review of Systems See HPI    Objective:   Physical Exam Well appearing, no apparent distress.  HEENT Neck supple. No cervical adenopathy.  ABD Soft, nontender. Nondistended.  MSK: Palpable tenderness along the LS spine left of midline.  Full active ROM hips, knees, and ankles.  Able to stand on tip toes and heels, also able to squat to floor and stand back up (albeit with some L lower back pain).  Sensation in legs grossly intact to touch.  Patellar reflexes 2+ symmetric bilaterally. No clonus bilaterally. Dorsi/plantar flexion full and symmetric/intact.  Palpable dp pulses bilaterally.  Straight leg raise to greater than 60 degrees bilaterally without tenderness.        Assessment & Plan:

## 2012-12-09 NOTE — Patient Instructions (Addendum)
Fue un [employer], [employer].  Para el dolor de espalda/pierna izquierda, estoy recetandole un anti-inflamatorio (Naproxen 500mg ), una tableta cada 12 horas con comida.  2. Relajante de musculos, "Cyclobenzaprine" 10mg , una tableta antes de acostarse, por los proximos 10 dias.  3. Bolsa de agua caliente a la espalda tres veces por dia.  4. Ejercicios para estirar la cadera y la pierna izquierda.   Distensin lumbo-sacra (Lumbosacral Strain) Distensin lumbo-sacra es una de las causas ms frecuentes de dolor de espalda. Hay numerosas causas que originan el dolor de espalda. Amber Branch no son enfermedades graves.  CAUSAS La columna vertebral (espina dorsal) est conformada por 24 vrtebras principales, adems del sacro y el cccix que estn unidas entre s por tejidos fibrosos y resistentes denominados ligamentos, y sostenidas por los msculos. Las races [address] pasan a travs de los agujeros Amber Branch vrtebras. Un movimiento brusco o un traumatismo en la espalda pueden ocasionar daos a estos nervios o presin sobre ellos. Esto puede traer Amber Branch consecuencia un dolor localizado en la espalda o un dolor que se irradia (se desplaza) hacia los glteos y por la pierna hasta el pie. Este trastorno, conocido como citica, est asociado frecuentemente a una ruptura (hernia) del disco. El dolor tambin puede estar causado slo por el espasmo muscular. El mdico a menudo podr [name] causa del dolor a partir de la informacin detallada de los sntomas y un examen fsico. En algunos casos, podr [employer] estudios (como radiografas), pero podran no ser los adecuados para usted. El profesional que lo asiste determinar si se necesitan otros estudios, segn el examen fsico especfico. INSTUCCIONES PARA EL CUIDADO DOMICILIARIO:  Evite el estilo de vida sedentario. El ejercicio [name], siguiendo las indicaciones del profesional que le asiste, es el arma ms importante con que cuenta para Amber Branch contra el dolor  de espalda.  Si no se encuentra en buen estado fsico, evite las actividades intensas (tenis, frontn, esqu acutico). Esto podra crear o agravar el problema.  Si siente dolor en la espalda, es importante evitar los deportes que requieran de movimientos corporales bruscos. La natacin y las caminatas son las actividades ms seguras.  Mantenga una buena postura.  Evite el sobrepeso (obesidad).  Haga reposo en cama slo en caso de los episodios repentinos ms extremos y agudos. El profesional que lo asiste ayudar a Amber Branch cunto reposo le conviene.  En los casos agudos, coloque hielo en la zona lesionada.  Ponga el hielo en una bolsa plstica.  Colquese una toalla entre la piel y Amber Branch.  Aplique el hielo 15 a 20 minutos por vez, cada dos horas o segn sea necesario.  Una vez que ha mejorado y est ms Amber Branch, ser til Corporate treasurer durante 30 minutos antes de las [facility]. Consulte con un mdico si el dolor se prolonga ms de lo esperado. El mdico podr ayudar o aconsejarle ejercicios adecuados o terapia si esta es necesaria. Con un buen entrenamiento fsico, podr evitar la mayor parte de los problemas de la espalda.  SOLICITE ATENCIN MDICA DE INMEDIATO SI:  Presenta entumecimiento, hormigueo, debilidad o algn problema en los brazos o las piernas.  Siente un dolor de espalda intenso que no mejora con medicamentos.  Observa cambios en el control del intestino o la vejiga.  Usted presenta dolor en algunas zonas del cuerpo que incluyen el estmago o el abdomen.  Sufre falta de aire, mareos o [place] Branch.  Comienza a sentir nuseas (ganas de vomitar), vmitos o sudoracin.  Decoloracin de sus dedos o  piernas o pies que se ponen muy fros.  El dolor de Amber Branch.  Tiene fiebre. EST SEGURO QUE:   Comprende las instrucciones para el alta mdica.  Controlar su enfermedad.  Solicitar atencin mdica de inmediato segn las indicaciones. Amber  Branch: 12/17/2004 Amber Branch: 06/01/2011 Amber Branch Amber Branch 2014 Amber Branch, [name].

## 2012-12-23 ENCOUNTER — Ambulatory Visit (INDEPENDENT_AMBULATORY_CARE_PROVIDER_SITE_OTHER): Payer: BC Managed Care – PPO | Admitting: Family Medicine

## 2012-12-23 ENCOUNTER — Encounter: Payer: Self-pay | Admitting: Family Medicine

## 2012-12-23 VITALS — BP 101/69 | HR 83 | Ht 60.0 in | Wt 185.0 lb

## 2012-12-23 DIAGNOSIS — L309 Dermatitis, unspecified: Secondary | ICD-10-CM

## 2012-12-23 DIAGNOSIS — L259 Unspecified contact dermatitis, unspecified cause: Secondary | ICD-10-CM

## 2012-12-23 DIAGNOSIS — L301 Dyshidrosis [pompholyx]: Secondary | ICD-10-CM

## 2012-12-23 DIAGNOSIS — M545 Low back pain: Secondary | ICD-10-CM

## 2012-12-23 DIAGNOSIS — Z23 Encounter for immunization: Secondary | ICD-10-CM

## 2012-12-23 MED ORDER — PREDNISONE 20 MG PO TABS: 60.0000 mg | ORAL_TABLET | Freq: Every day | ORAL | Status: DC

## 2012-12-23 MED ORDER — TRIAMCINOLONE ACETONIDE 0.5 % EX OINT: TOPICAL_OINTMENT | Freq: Two times a day (BID) | CUTANEOUS | Status: DC

## 2012-12-23 NOTE — Patient Instructions (Signed)
Fue un Research officer, trade union.  Para el dolor de espalda/pierna izquierda, estoy recetandole un curso de prednisone 20mg  tabletas, tome 1 tableta por boca 3 veces diario (1 con cada comida=60mg  por dia), por un total de 7 dias.   Si no esta' mejor el dolor de [place], vaya a sacarse una radiografia que ordene' hoy durante la visita.  Le llamo al [phone] cuando vea el resultado de la placa. SI SE MEJORA CON LA PREDNISONA, POR FAVOR NO SE HAGA LA PLACA.  Citica  (Sciatica)  La citica es Chief Technology Officer, debilidad, entumecimiento u hormigueo a lo largo del nervio citico. El nervio comienza en la zona inferior de la espalda y desciende por la parte posterior de cada pierna. El nervio controla los msculos de la parte inferior de la pierna y de la zona posterior de la rodilla, y transmite la sensibilidad a la parte posterior del muslo, la pierna y la planta del pie. La citica es un sntoma de otras afecciones mdicas. Por ejemplo, un dao a los nervios o algunas enfermedades como un disco herniado o un espoln seo en la columna vertebral, podran daarle o presionar en el nervio citico. Esto causa dolor, debilidad y otras sensaciones normalmente asociadas con la citica. Generalmente la citica afecta slo un lado del cuerpo. CAUSAS   Disco herniado o desplazado.  Enfermedad degenerativa del disco.  Un sndrome doloroso que compromete un msculo angosto de los glteos (sndrome piriforme).  Lesin o fractura plvica.  Embarazo.  Tumor (casos raros). SNTOMAS  Los sntomas pueden variar de leves a muy graves. Por lo general, los sntomas descienden desde la zona lumbar a las nalgas y la parte posterior de la pierna. Ellos son:   Hormigueo leve o dolor sordo en la parte inferior de la espalda, la pierna o la cadera.  Adormecimiento en la parte posterior de la pantorrilla o la planta del pie.  Sensacin de [employer] zona lumbar, la pierna o la cadera.  Dolor agudo en la zona inferior de la  espalda, la pierna o la cadera.  Debilidad en las piernas.  Dolor de espalda intenso que [employer] movimientos. Los sntomas pueden empeorar al toser, Engineering geologist, rer o estar sentado o parado durante [employer]. Adems, el sobrepeso puede empeorar los sntomas.  DIAGNSTICO  Su mdico le har un examen fsico para buscar los sntomas comunes de la citica. Le pedir que haga algunos movimientos o actividades que activaran el dolor del nervio citico. Para encontrar las causas de la citica podr indicarle otros estudios. Estos pueden ser:   Anlisis de [place].  Radiografas.  Pruebas de diagnstico por imgenes, como resonancia magntica o tomografa computada. TRATAMIENTO  El tratamiento se dirige a las causas de la citica. A veces, el tratamiento no es necesario, y Chief Technology Officer y Environmental health practitioner desaparecen por s mismos. Si necesita tratamiento, su mdico puede sugerir:   Medicamentos de venta libre para Engineer, materials.  Medicamentos recetados, como antiinflamatorios, relajantes musculares o narcticos.  Aplicacin de calor o hielo en la zona del dolor.  Inyecciones de corticoides para disminuir el dolor, la irritacin y la inflamacin alrededor del nervio.  Reduccin de la [employer] perodos de [place].  Ejercicios y estiramiento del abdomen para fortalecer y Scientist, clinical (histocompatibility and immunogenetics) la flexibilidad de la columna vertebral. Su mdico puede sugerirle perder peso si el peso extra empeora el dolor de espalda.  Fisioterapia.  La ciruga para eliminar lo que presiona o pincha el nervio, como un espoln seo o Research scientist (life sciences)  de una hernia de disco. INSTRUCCIONES PARA EL CUIDADO EN EL HOGAR   Slo tome medicamentos de venta libre o recetados para Primary school teacher o Environmental health practitioner, segn las indicaciones de su mdico.  Aplique hielo sobre el rea dolorida durante 20 minutos 3-4 veces por da durante los primeras 48-72 horas. Luego intente aplicar calor de la misma manera.  Haga ejercicios, elongue o realice  sus actividades habituales, si no le causan ms dolor.  Cumpla con todas las sesiones de fisioterapia, segn le indique su mdico.  Cumpla con todas las visitas de control, segn le indique su mdico.  No use tacones altos o zapatos que no tengan buen apoyo.  Verifique que el colchn no sea muy blando. Un colchn firme Engineer, materials y las [place]. SOLICITE ATENCIN MDICA DE INMEDIATO SI:   Pierde el control de la vejiga o del intestino (incontinencia).  Aumenta la debilidad en la zona inferior de la espalda, la pelvis, las nalgas o las piernas.  Siente irritacin o inflamacin en la espalda.  Tiene sensacin de ardor al [employer].  El dolor empeora cuando se acuesta o lo despierta por la noche.  El dolor es peor del que experiment en el pasado.  Dura ms de 4 semanas.  Pierde peso sin motivo de [employer] sbita. ASEGRESE DE QUE:   Comprende estas instrucciones.  Controlar su enfermedad.  Solicitar ayuda de inmediato si no mejora o si empeora. Document Released: 03/09/2005 Document Revised: 09/08/2011 [facility] Patient Information 2014 [place], [name].

## 2012-12-23 NOTE — Assessment & Plan Note (Signed)
Pain onset and distribution consistent with sciatica, although her SLR does not correlate with this.  Will treat with rapid course systemic prednisone, start cold/hot packs.  If no better with short course prednisone, she is to go for LS spine x-ray and I will call her back at her cell ([phone]) to discuss clinical progress.  She understands to come back if worsens, or if new problems arise.

## 2012-12-23 NOTE — Progress Notes (Signed)
  Subjective:    Patient ID: Amber Branch, female    DOB: [birth year], 32 y.o.   MRN: [id number]  HPI Visit conducted in Spanish.  Patient presents for follow up on pain along L buttock/low back, radiating down back of L buttock and to popliteal area along sciatic nerve distribution.  Worse with getting up from laying/sitting position; sitting to standing position.  On further recollection, she associates onset to around the time of her delivery NSVD May 09, 2012, during which she received an epidural anesthesia.  No trauma since last visit.  She used the flexeril with mild improvement, it made her sleepy during the day.  Did not use heat or cold packs.   She denies falls or trauma since out last visit.    Review of Systems No weakness or falls.  No fevers or chills.     Objective:   Physical Exam Well appearing, no apparent distress.  Able to ambulate and climb up to exam table without limitation.  HEENT Neck supple. No cervical adenopathy.  MSK: Mild tenderness along Left LS area near sciatic notch.  SLR to >45 degrees.  Strength testing ankles, knees, hips and great toe flexion/extension intact.  Patellar reflexes 1-2+ bilaterally symmetric.  Sensation intact bilateral feet. Palpable dp pulses bilaterally. Skin along medial aspect L foot with mild erythema and small vesicles with clear fluid.        Assessment & Plan:

## 2013-01-02 ENCOUNTER — Ambulatory Visit
Admission: RE | Admit: 2013-01-02 | Discharge: 2013-01-02 | Disposition: A | Discharge status: Home / Self Care | Payer: BC Managed Care – PPO | Source: Ambulatory Visit | Attending: Family Medicine | Admitting: Family Medicine

## 2013-01-02 DIAGNOSIS — M545 Low back pain: Secondary | ICD-10-CM

## 2013-01-04 ENCOUNTER — Telehealth: Payer: Self-pay | Admitting: Family Medicine

## 2013-01-04 DIAGNOSIS — M545 Low back pain: Secondary | ICD-10-CM

## 2013-01-04 NOTE — Telephone Encounter (Signed)
Called patient to mobile phone, call completed in Spanish.  Related the results of the recent LS Spine x-ray (no bony abnormalities to explain her back pain).  Prednisone did not help the pain, but warm compresses helped for a little while.  My strong suspicion is that this is muscular in origin.  Will refer for Physical Therapy (patient with [employer]) for evaluation and treatment.  She is to call back if this is not helping her. Paula Compton, MD

## 2013-01-05 NOTE — Telephone Encounter (Signed)
I will process the referral and let pt know when appt is ready for her.  [place]

## 2013-01-10 ENCOUNTER — Ambulatory Visit: Payer: BC Managed Care – PPO | Attending: Family Medicine | Admitting: Physical Therapy

## 2013-01-10 DIAGNOSIS — IMO0001 Reserved for inherently not codable concepts without codable children: Secondary | ICD-10-CM | POA: Insufficient documentation

## 2013-01-10 DIAGNOSIS — M25559 Pain in unspecified hip: Secondary | ICD-10-CM | POA: Insufficient documentation

## 2013-01-10 DIAGNOSIS — M545 Low back pain, unspecified: Secondary | ICD-10-CM | POA: Insufficient documentation

## 2013-01-12 ENCOUNTER — Ambulatory Visit: Payer: BC Managed Care – PPO | Admitting: Physical Therapy

## 2013-01-17 ENCOUNTER — Ambulatory Visit: Payer: BC Managed Care – PPO | Admitting: Physical Therapy

## 2013-01-19 ENCOUNTER — Ambulatory Visit: Payer: BC Managed Care – PPO | Admitting: Rehabilitation

## 2013-01-24 ENCOUNTER — Ambulatory Visit: Payer: BC Managed Care – PPO | Admitting: Rehabilitation

## 2013-01-26 ENCOUNTER — Ambulatory Visit: Payer: BC Managed Care – PPO | Admitting: Physical Therapy

## 2013-01-27 ENCOUNTER — Encounter: Payer: Self-pay | Admitting: Physical Therapy

## 2013-01-31 ENCOUNTER — Encounter: Payer: Self-pay | Admitting: Family Medicine

## 2013-01-31 ENCOUNTER — Ambulatory Visit (INDEPENDENT_AMBULATORY_CARE_PROVIDER_SITE_OTHER): Payer: BC Managed Care – PPO | Admitting: Family Medicine

## 2013-01-31 VITALS — BP 100/68 | HR 78 | Ht 60.0 in | Wt 184.0 lb

## 2013-01-31 DIAGNOSIS — O99810 Abnormal glucose complicating pregnancy: Secondary | ICD-10-CM

## 2013-01-31 DIAGNOSIS — E781 Pure hyperglyceridemia: Secondary | ICD-10-CM

## 2013-01-31 DIAGNOSIS — M545 Low back pain, unspecified: Secondary | ICD-10-CM

## 2013-01-31 DIAGNOSIS — O24419 Gestational diabetes mellitus in pregnancy, unspecified control: Secondary | ICD-10-CM

## 2013-01-31 LAB — COMPREHENSIVE METABOLIC PANEL
AST: 15 U/L (ref 0–37)
Albumin: 4.5 g/dL (ref 3.5–5.2)
Alkaline Phosphatase: 66 U/L (ref 39–117)
Potassium: 4.1 mEq/L (ref 3.5–5.3)
Sodium: 136 mEq/L (ref 135–145)
Total Protein: 7 g/dL (ref 6.0–8.3)

## 2013-01-31 LAB — LIPID PANEL
Cholesterol: 195 mg/dL (ref 0–200)
LDL Cholesterol: 117 mg/dL — ABNORMAL HIGH (ref 0–99)
VLDL: 37 mg/dL (ref 0–40)

## 2013-01-31 NOTE — Patient Instructions (Signed)
Fue un placer verle hoy; le llamo al [phone] con los [place] de los estudios de [place].   PLEASE ASK PATIENT TO SIGN A TWO-WAY RELEASE OF INFORMATION FORM TO/FROM BARIATRIC CLINIC AT [address] OF [address].   PLEASE MAKE RN APPOINTMENT FOR PATIENT'S DAUGHTER SOPHIA TOLEDO CRUZ (RECEIVED FIRST PART OF FLU VACCINE OVER A MONTH AGO, NEEDS SECOND DOSE).

## 2013-02-01 ENCOUNTER — Telehealth: Payer: Self-pay | Admitting: Family Medicine

## 2013-02-01 NOTE — Assessment & Plan Note (Signed)
Better since she was afforded a break from constantly carrying her infant daughter (has family support to help with child care).  Physical therapy was not very helpful.  To monitor for worsening.

## 2013-02-01 NOTE — Telephone Encounter (Signed)
Called patient at her cell phone to report these results, conversation in [place].  The triglycerides are markedly improved from the value obtained by the Bariatric Clinic on Nov 3rd (was 630 then; 180s now).  Will continue to monitor; no pharmacologic therapy for this at this time. Glucose 100, TFTs normal.  To forward these results to the Bariatric Clinic as well.  Paula Compton, MD

## 2013-02-01 NOTE — Progress Notes (Signed)
  Subjective:    Patient ID: Amber Branch, female    DOB: [birth year], 32 y.o.   MRN: [id number]  HPI Patient seen in follow up, visit conducted in [place]. She is being seen at a medical Bariatric Clinic ([facility]) and had laboratory screening there which showed elevated cholesterol.  She is here for follow up of this.  Prior to starting treatment at the bariatric clinic, she has been using Herbalife products for 3 years but could not tolerate the tea due to headaches.  Last week she was started on phentermine 37.5mg  daily for weight loss.   In reviewing chart, her previous lipid panel in our system (Sept 21, 2012): TC 195, HDL 49, LDL 89, TGs 286.   Patient delivered approximately 9 months ago (daughter, Jeanette Caprice).  Peony had been thought to have GDM during pregnancy but later was told hers was not a definitive diagnosis.  I see she passed a 2-hr GTT on April 2nd, 2014.    Regarding her chronic low back pain and L radiation, she says the physical therapy sessions aggravated the pain more  She has had less pain since she stopped going (went to PT twice) and that she has had more help with her daughter, meaning she has not had to carry Sophia around as much.  This has helped her pain quite a bit.   Review of Systems     Objective:   Physical Exam Well appearing, no apparent distress HEENT Neck supple.         Assessment & Plan:

## 2013-02-01 NOTE — Assessment & Plan Note (Signed)
Plan to recheck lipid panel today; routine DM screening as well.  Patient continues with Bariatric Clinic management of her weight, on Phentermine now.  She is not breast feeding.

## 2013-04-23 IMAGING — US US OB FOLLOW-UP
1 series · 12 of 28 positions shown · non-contrast
Comparison: none

[Series 1: us ob follow up · 12 of 31 slices shown]
[im 2/31]
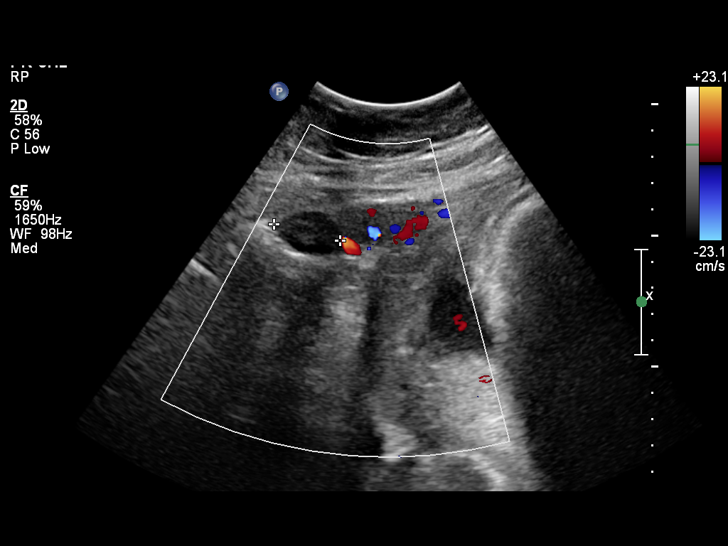
[im 4/31]
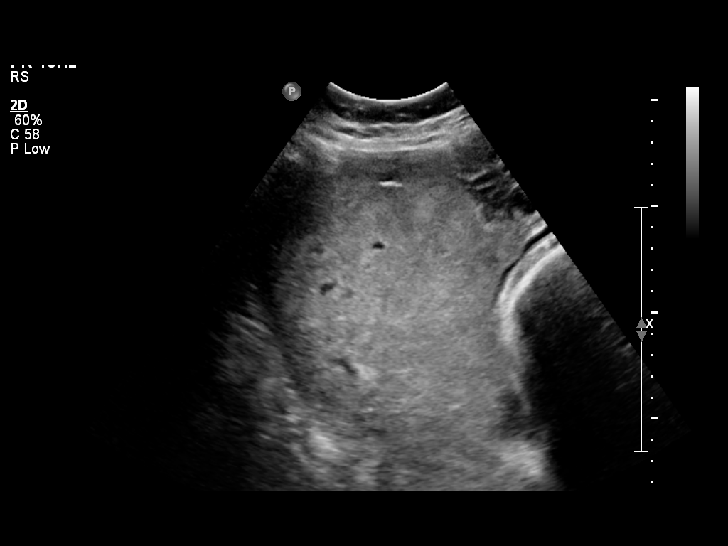
[im 6/31]
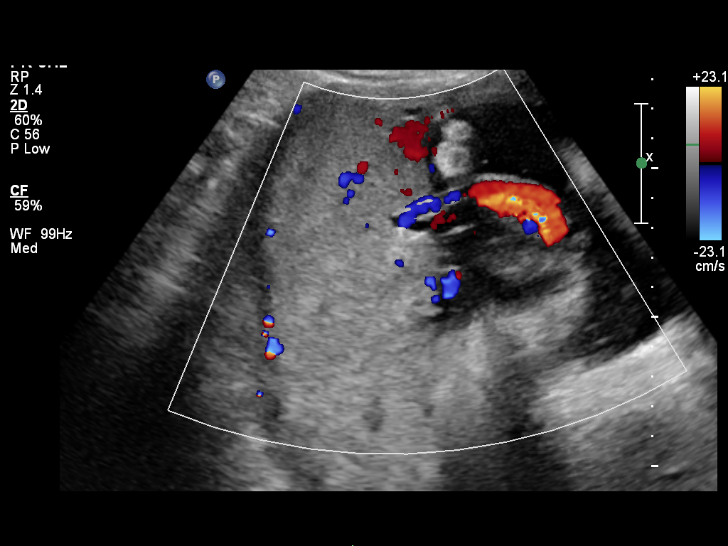
[im 9/31]
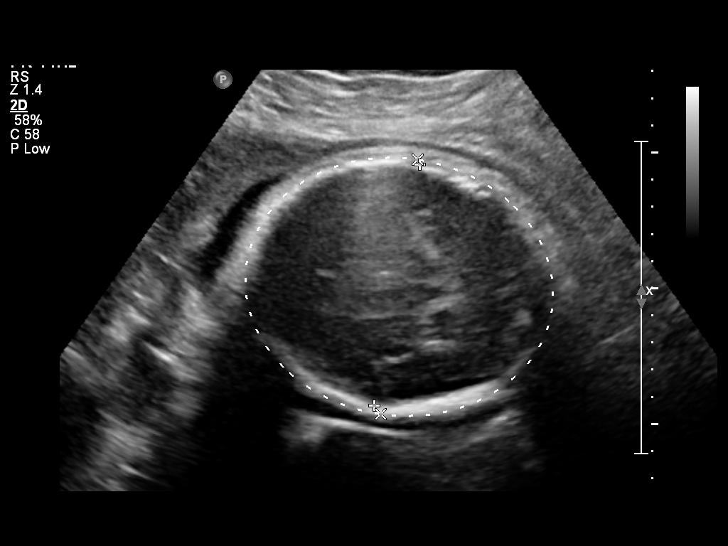
[im 12/31]
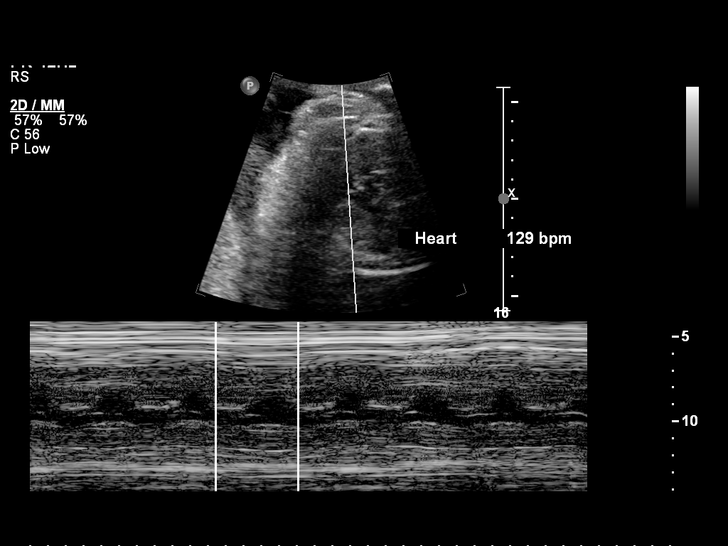
[im 14/31]
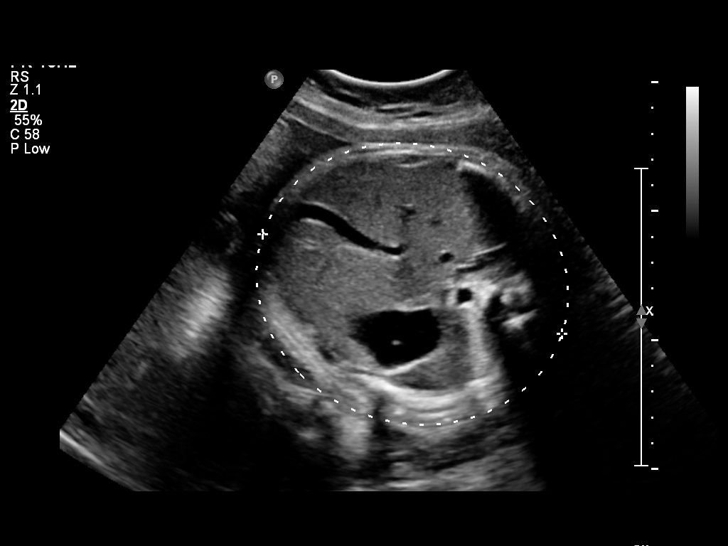
[im 17/31]
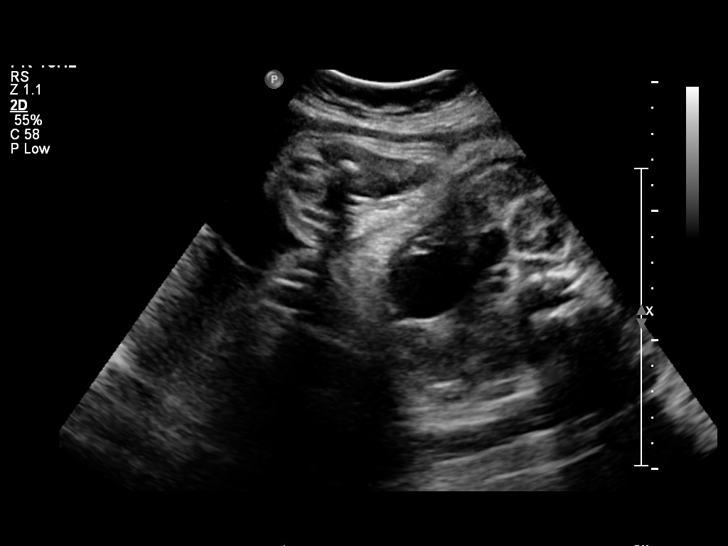
[im 19/31]
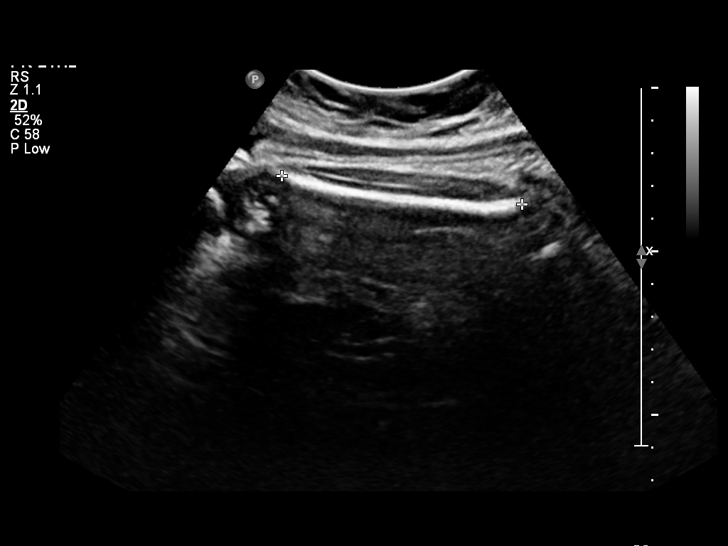
[im 22/31]
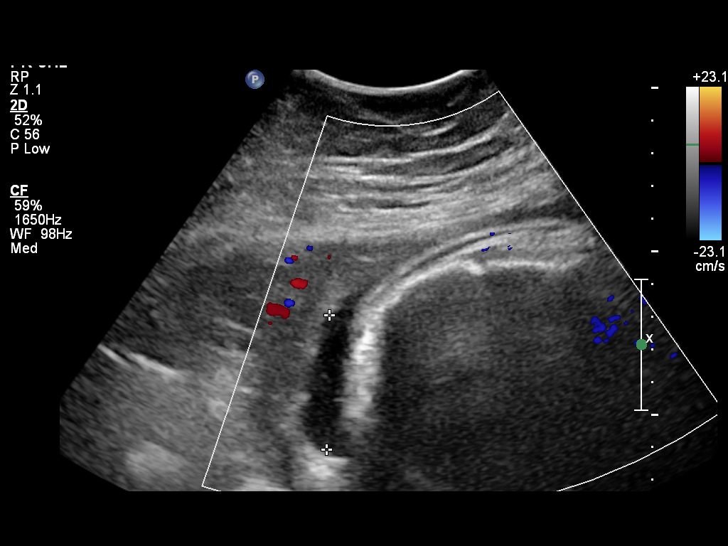
[im 25/31]
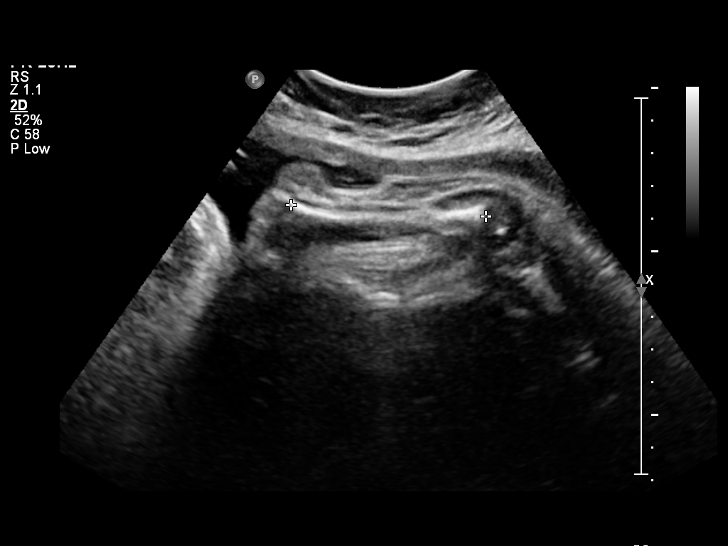
[im 27/31]
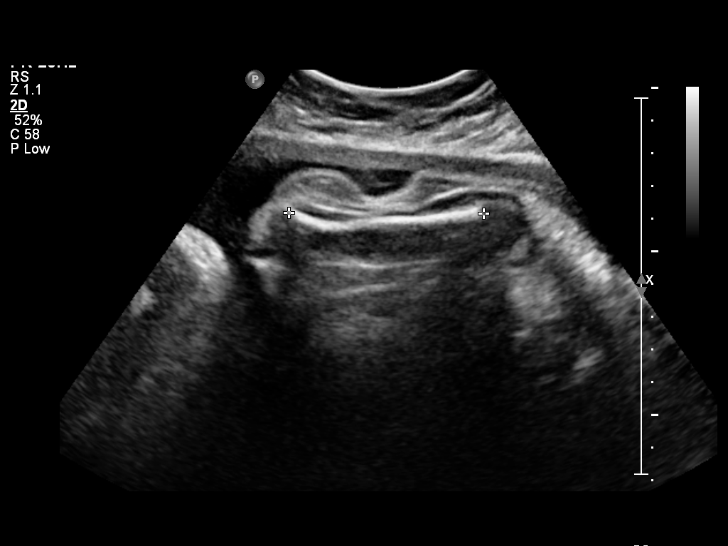
[im 29/31]
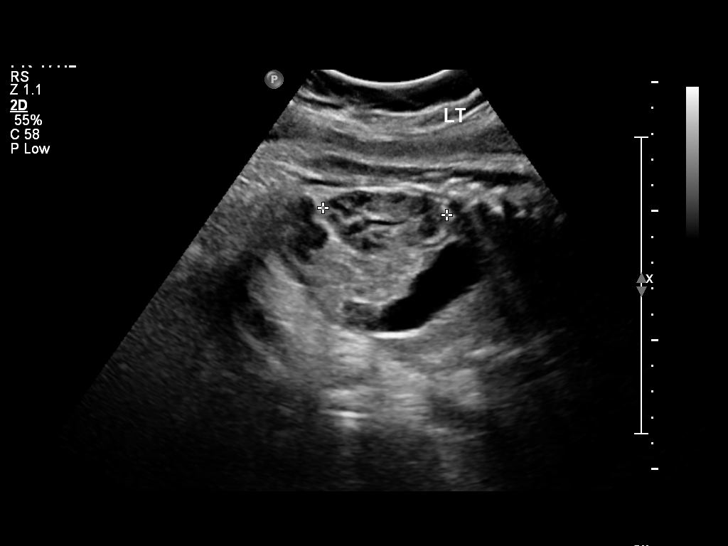

[12 of 28 positions shown; findings below may reference images not displayed]

OBSTETRICS REPORT
                      (Signed Final 04/20/2012 [DATE])

Service(s) Provided

 US OB FOLLOW UP                                       76816.1
Indications

 Obesity
 Diabetes - Gestational
 Poor obstetric history-Recurrent (habitual) abortion
 (3 consecutive ab's)
 Marginal cord insertion
 Growth
Fetal Evaluation

 Num Of Fetuses:    1
 Fetal Heart Rate:  129                         bpm
 Cardiac Activity:  Observed
 Presentation:      Cephalic
 Placenta:          Right lateral, above
                    cervical os
 P. Cord            Marginal insertion
 Insertion:

 Amniotic Fluid
 AFI FV:      Subjectively within normal limits
 AFI Sum:     16.23   cm      61   %Tile     Larg Pckt:   7.51   cm
 RUQ:   7.51   cm    RLQ:    4.09   cm    LUQ:   4.46    cm   LLQ:    0.17   cm
Biometry

 BPD:     90.7  mm    G. Age:   36w 5d                CI:        76.43   70 - 86
                                                      FL/HC:      21.9   20.8 -

 HC:     328.7  mm    G. Age:   37w 3d       39  %    HC/AC:      0.91   0.92 -

 AC:       360  mm    G. Age:   39w 6d     > 97  %    FL/BPD:     79.4   71 - 87
 FL:        72  mm    G. Age:   36w 6d       52  %    FL/AC:      20.0   20 - 24
 HUM:     58.8  mm    G. Age:   34w 0d       19  %

 Est. FW:    1217  gm    7 lb 13 oz    > 90  %
Gestational Age

 LMP:           51w 4d       Date:   04/25/11                 EDD:   01/30/12
 U/S Today:     37w 5d                                        EDD:   05/06/12
 Best:          36w 5d    Det. By:   Early Ultrasound         EDD:   05/13/12
Anatomy

 Cranium:          Appears normal         Aortic Arch:      Previously seen
 Fetal Cavum:      Previously seen        Ductal Arch:      Previously seen
 Ventricles:       Appears normal         Diaphragm:        Previously seen
 Choroid Plexus:   Previously seen        Stomach:          Appears normal, left
                                                            sided
 Cerebellum:       Previously seen        Abdomen:          Appears normal
 Posterior Fossa:  Previously seen        Abdominal Wall:   Previously seen
 Nuchal Fold:      Previously seen        Cord Vessels:     Previously seen
 Face:             Orbits previously      Kidneys:          Appear normal
                   seen
 Lips:             Previously seen        Bladder:          Appears normal
 Heart:            Appears normal         Spine:            Previously seen
                   (4CH, axis, and
                   situs)
 RVOT:             Previously seen        Lower             Previously seen
                                          Extremities:
 LVOT:             Not well visualized    Upper             Previously seen
                                          Extremities:

 Other:  Heels previously visualized. Technically difficult due to maternal
         habitus and fetal position.
Cervix Uterus Adnexa

 Cervix:       Not visualized (advanced GA >34 wks)
 Uterus:       No abnormality visualized.

 Left Ovary:   No adnexal mass visualized.
 Right Ovary:  Within normal limits. No adnexal mass visualized.
 Adnexa:     No abnormality visualized.
Impression

 Assigned GA is currently 36w 5d.   Fetus now measures
 large-for-gestational-age (EFW of 0005gm >90 %ile).
 Amniotic fluid within normal limits, with AFI of 16.23 cm.
 Marginal placental cord insertion again noted.  No previa.

## 2013-04-28 ENCOUNTER — Ambulatory Visit (INDEPENDENT_AMBULATORY_CARE_PROVIDER_SITE_OTHER): Payer: BC Managed Care – PPO | Admitting: Family Medicine

## 2013-04-28 ENCOUNTER — Encounter: Payer: Self-pay | Admitting: Family Medicine

## 2013-04-28 VITALS — BP 118/77 | HR 61 | Temp 98.3°F | Ht 60.0 in | Wt 186.3 lb

## 2013-04-28 DIAGNOSIS — Z3009 Encounter for other general counseling and advice on contraception: Secondary | ICD-10-CM | POA: Insufficient documentation

## 2013-04-28 DIAGNOSIS — F341 Dysthymic disorder: Secondary | ICD-10-CM

## 2013-04-28 NOTE — Patient Instructions (Signed)
Fue un Research officer, trade unionplacer verle hoy.  Estamos quitandole el dispositivo hoy.  Quiero verle de nuevo en un mes, para ver si esta' mejor del animo.   MAKE FOLLOW UP WITH DR Mauricio PoBREEN IN 1 MONTH FOR MOOD.

## 2013-04-28 NOTE — Progress Notes (Signed)
   Subjective:    Patient ID: Amber Branch, female    DOB: 1980/11/19, 33 y.o.   MRN: [id number]  HPI  Visit in Spanish.  Patient requesting Mirena IUD removal; she had it placed just under a year ago after the delivery of her youngest daughter, Amber Branch.  Has scant irregular menses, which she does not like. Has had mood swings frequently and feels depressed, which she attributes to the hormonal IUD.  Had Mirena for a few years after the delivery of daughter Amber Branch and after a subsequent SAB, removed when she wished to become pregnant.  Her and husband's plans are to attempt another pregnancy in the coming 6 to 8 months, and she wishes for BTL after that pregnancy.   Reports unprovoked crying spells, feeling anhedonia. Is able to keep up with her children at home, is caring for herself. No thoughts of self-harm.  Has never been treated for depression with medication in the past.   Review of Systems No fevers, no vaginal discharge. No history of abnormal pap smear.      Objective:   Physical Exam Well appearing, no apparent distress GYN: Normal vaginal mucosa. Scant bleeding from cervical os.  IUD strings easily visualized.  Ring forceps used to remove IUD without difficulty. Tolerated well by patient.        Assessment & Plan:

## 2013-05-30 ENCOUNTER — Ambulatory Visit: Payer: Self-pay | Admitting: Family Medicine

## 2013-06-20 ENCOUNTER — Encounter: Payer: Self-pay | Admitting: Family Medicine

## 2013-06-20 ENCOUNTER — Ambulatory Visit (INDEPENDENT_AMBULATORY_CARE_PROVIDER_SITE_OTHER): Payer: BC Managed Care – PPO | Admitting: Family Medicine

## 2013-06-20 VITALS — BP 121/77 | HR 80 | Ht 60.0 in | Wt 187.5 lb

## 2013-06-20 DIAGNOSIS — G4700 Insomnia, unspecified: Secondary | ICD-10-CM

## 2013-06-20 DIAGNOSIS — M7711 Lateral epicondylitis, right elbow: Secondary | ICD-10-CM

## 2013-06-20 DIAGNOSIS — M7710 Lateral epicondylitis, unspecified elbow: Secondary | ICD-10-CM

## 2013-06-20 MED ORDER — NORTRIPTYLINE HCL 25 MG PO CAPS: 25.0000 mg | ORAL_CAPSULE | Freq: Every day | ORAL | Status: DC

## 2013-06-20 NOTE — Patient Instructions (Addendum)
Para el dolor del codo derecho ("Tennis Elbow", o "epicondilitis lateral"), puede comprar una cinta en la farmacia ("TENNIS ELBOW STRAP") para aplicar en el antebrazo derecho durante el dia.   Doblar una toalla de bano a lo largo y envuelva el codo derecho con ella antes de dormir.  Esto previene que se doble el codo durante la noche y le quita la presion del codo, para que se mejore mas rapido.   Ibuprofen 200mg  tabletas, tome 2 a 4 tabletas con algo de comer, cada 6 a 8 horas durante los primeros 5 dias, despues solo segun necesite para el dolor y la inflamacion.   Puede usar bolsas de agua tibia para que se calme el dolor cuando esta' en la noche.    Fijese en sus actividades durante el dia, para ver si puede identificar alguna posicion o actividad que le Runner, broadcasting/film/videoempeore el dolor.   Nortriptyline 25mg , una vez 1 hora antes de dormir, para el sueno y Mongoliatambien es un antidepresivo.  Quiero volver a verle en 3 a 4 semanas.   FOLLOW UP IN 3 TO 4 WEEKS WITH DR Mauricio PoBREEN.

## 2013-06-21 NOTE — Progress Notes (Signed)
   Subjective:    Patient ID: Amber Branch, female    DOB: 01-Jun-1980, 33 y.o.   MRN: [id number]  HPI Visit conducted in Spanish.  Amber Branch comes in with chief complaint of right lateral elbow pain, worse when she lifts something heavy.  Has been ongoing for a few weeks.  No episode of trauma that she recalls. Has not done anything to try and ameliorate the pain.   Also reports increased stress at home; some of it from financial stressors in her husband's business, and this is causing stress in the household.  Also, husband's cousin has been hospitalized in ICU for acute stroke for over a week, was a sudden event.  Amber Branch and her family are providing support and frequent visitation to the cousin and his family.  Amber Branch reports disturbances in her sleep, feelings of depression, and frequent spells of crying.  She would like help with her sleep, with problems with sleep initiation and maintenance (thinking about the problems of the day).  Not taking any meds for this at this time.  Does not smoke, no alcohol.    Review of Systems     Objective:   Physical Exam  Alert, well appearing, in good spirits during visit.  No apparent distress HEENT neck supple, no cervical adenopathy MSK: right elbow with pain over the R lateral epicondyle.  No erythema or edema.  Full active ROM and strength in R elbow, shoulder, wrist and handgrip.  Negative Tinnels and Phalens bilaterally.  Palpable radial pulses bilaterally, no active synovitis in digits of either hand.        Assessment & Plan:

## 2013-06-21 NOTE — Assessment & Plan Note (Signed)
Insomnia as manifestation of situational depression, exacerbated by economic stressors at home and critical illness of a close family member.  Low-dose TCA to assist with sleep; consideration of increase in dose as needed in follow up in 2 to 4 weeks, or sooner as needed.

## 2013-06-21 NOTE — Assessment & Plan Note (Signed)
Right lateral epicondylitis.  Tennis elbow strap, limitation of flexion of R elbow while sleeping. Warm compresses, NSAIDs or tylenol as needed. Follow up if not improving. Keep a record of events/activities that exacerbate the pain.

## 2013-08-24 ENCOUNTER — Inpatient Hospital Stay (HOSPITAL_COMMUNITY): Payer: BC Managed Care – PPO

## 2013-08-24 ENCOUNTER — Encounter (HOSPITAL_COMMUNITY): Payer: Self-pay | Admitting: *Deleted

## 2013-08-24 ENCOUNTER — Inpatient Hospital Stay (HOSPITAL_COMMUNITY)
Admission: AD | Admit: 2013-08-24 | Discharge: 2013-08-25 | Disposition: A | Discharge status: Home / Self Care | Payer: BC Managed Care – PPO | Source: Ambulatory Visit | Attending: Obstetrics and Gynecology | Admitting: Obstetrics and Gynecology

## 2013-08-24 DIAGNOSIS — R1013 Epigastric pain: Secondary | ICD-10-CM | POA: Insufficient documentation

## 2013-08-24 DIAGNOSIS — R109 Unspecified abdominal pain: Secondary | ICD-10-CM

## 2013-08-24 DIAGNOSIS — R1032 Left lower quadrant pain: Secondary | ICD-10-CM | POA: Insufficient documentation

## 2013-08-24 DIAGNOSIS — O99891 Other specified diseases and conditions complicating pregnancy: Secondary | ICD-10-CM | POA: Insufficient documentation

## 2013-08-24 DIAGNOSIS — O210 Mild hyperemesis gravidarum: Secondary | ICD-10-CM | POA: Insufficient documentation

## 2013-08-24 DIAGNOSIS — O9989 Other specified diseases and conditions complicating pregnancy, childbirth and the puerperium: Principal | ICD-10-CM

## 2013-08-24 DIAGNOSIS — O26899 Other specified pregnancy related conditions, unspecified trimester: Secondary | ICD-10-CM

## 2013-08-24 LAB — URINE MICROSCOPIC-ADD ON

## 2013-08-24 LAB — CBC
HCT: 37.1 % (ref 36.0–46.0)
Hemoglobin: 12.5 g/dL (ref 12.0–15.0)
MCH: 30.5 pg (ref 26.0–34.0)
MCHC: 33.7 g/dL (ref 30.0–36.0)
MCV: 90.5 fL (ref 78.0–100.0)
PLATELETS: 213 10*3/uL (ref 150–400)
RBC: 4.1 MIL/uL (ref 3.87–5.11)
RDW: 13.6 % (ref 11.5–15.5)
WBC: 8.1 10*3/uL (ref 4.0–10.5)

## 2013-08-24 LAB — URINALYSIS, ROUTINE W REFLEX MICROSCOPIC
Bilirubin Urine: NEGATIVE
GLUCOSE, UA: NEGATIVE mg/dL
Hgb urine dipstick: NEGATIVE
KETONES UR: NEGATIVE mg/dL
NITRITE: NEGATIVE
PH: 6 (ref 5.0–8.0)
Protein, ur: NEGATIVE mg/dL
SPECIFIC GRAVITY, URINE: 1.015 (ref 1.005–1.030)
Urobilinogen, UA: 0.2 mg/dL (ref 0.0–1.0)

## 2013-08-24 LAB — POCT PREGNANCY, URINE: PREG TEST UR: POSITIVE — AB

## 2013-08-24 LAB — HCG, QUANTITATIVE, PREGNANCY: HCG, BETA CHAIN, QUANT, S: 58132 m[IU]/mL — AB (ref <5)

## 2013-08-24 NOTE — MAU Provider Note (Signed)
History     CSN: [id number]  Arrival date and time: 08/24/13 1816   None     Chief Complaint  Patient presents with  . Abdominal Pain  . Possible Pregnancy   HPI Pt is [id number] with LMP 06/24/2013 with positive pregnancy test in May.  Pt states she is having LLQ pain that started 3 days ago,.  The pain  Radiates to her back.  The pain is worse when she sits upright- pain is constant and cramping and is worse when she is on left side. Pt also notes epigastric pain which is new.  Pt c/o of morning sickness and nausea.  Pt has difficulty sleeping and has a headache. Pt has not taken anything for pain.  Pt denies fever, chills, constipation, or diarrhea, UTI sx or vaginal discharge.   Thornton Park, RN Registered Nurse Signed  MAU Note Service date: 08/24/2013 7:52 PM   Pt reports pain in left lower abd and lower back x 3 days. LMP 06/24/2013, positive Home preg test     Past Medical History  Diagnosis Date  . No pertinent past medical history     Past Surgical History  Procedure Laterality Date  . No past surgeries      Family History  Problem Relation Age of Onset  . Anesthesia problems Neg Hx   . Hypotension Neg Hx   . Malignant hyperthermia Neg Hx   . Pseudochol deficiency Neg Hx   . Other Neg Hx   . Alcohol abuse Neg Hx   . Arthritis Neg Hx   . Asthma Neg Hx   . Birth defects Neg Hx   . Cancer Neg Hx   . COPD Neg Hx   . Depression Neg Hx   . Diabetes Neg Hx   . Drug abuse Neg Hx   . Early death Neg Hx   . Hearing loss Neg Hx   . Heart disease Neg Hx   . Hyperlipidemia Neg Hx   . Hypertension Neg Hx   . Kidney disease Neg Hx   . Learning disabilities Neg Hx   . Mental illness Neg Hx   . Mental retardation Neg Hx   . Miscarriages / Stillbirths Neg Hx   . Stroke Neg Hx   . Vision loss Neg Hx     History  Substance Use Topics  . Smoking status: Never Smoker   . Smokeless tobacco: Never Used  . Alcohol Use: No    Allergies:  Allergies  Allergen  Reactions  . Codeine Rash    Can take tylenol with codeine    Prescriptions prior to admission  Medication Sig Dispense Refill  . naproxen (NAPROSYN) 500 MG tablet Take 1 tablet (500 mg total) by mouth 2 (two) times daily with a meal.  60 tablet  0  . nortriptyline (PAMELOR) 25 MG capsule Take 1 capsule (25 mg total) by mouth at bedtime.  30 capsule  3  . Prenatal Vit-Fe Fumarate-FA (PRENATAL MULTIVITAMIN) TABS Take 1 tablet by mouth at bedtime.      . triamcinolone ointment (KENALOG) 0.5 % Apply topically 2 (two) times daily.  30 g  2    Review of Systems  Constitutional: Negative for fever and chills.  Gastrointestinal: Positive for heartburn and nausea. Negative for abdominal pain, diarrhea and constipation.  Genitourinary: Negative for dysuria.   Physical Exam   Blood pressure 113/75, pulse 79, temperature 98.2 F (36.8 C), temperature source Oral, resp. rate 18, height 5' (1.524 m), weight  194 lb (87.998 kg), last menstrual period 06/24/2013, SpO2 99.00%, not currently breastfeeding.  Physical Exam  Nursing note and vitals reviewed. Constitutional: She is oriented to person, place, and time. She appears well-developed and well-nourished. No distress.  HENT:  Head: Normocephalic.  Eyes: Pupils are equal, round, and reactive to light.  Neck: Normal range of motion. Neck supple.  Cardiovascular: Normal rate.   Respiratory: Effort normal.  GI: Soft. She exhibits no distension. There is no tenderness. There is no rebound.  Genitourinary:  Mod amount of cream colored watery discharge in vault; cervix, long, closed, NT; uterus 8 week size, NT; adnexa without palpable enlargement or tenderness  Musculoskeletal: Normal range of motion.  Neurological: She is alert and oriented to person, place, and time.  Skin: Skin is warm and dry.  Psychiatric: She has a normal mood and affect.    MAU Course  Procedures Results for orders placed during the hospital encounter of 08/24/13 (from  the past 24 hour(s))  URINALYSIS, ROUTINE W REFLEX MICROSCOPIC     Status: Abnormal   Collection Time    08/24/13  7:52 PM      Result Value Ref Range   Color, Urine YELLOW  YELLOW   APPearance CLEAR  CLEAR   Specific Gravity, Urine 1.015  1.005 - 1.030   pH 6.0  5.0 - 8.0   Glucose, UA NEGATIVE  NEGATIVE mg/dL   Hgb urine dipstick NEGATIVE  NEGATIVE   Bilirubin Urine NEGATIVE  NEGATIVE   Ketones, ur NEGATIVE  NEGATIVE mg/dL   Protein, ur NEGATIVE  NEGATIVE mg/dL   Urobilinogen, UA 0.2  0.0 - 1.0 mg/dL   Nitrite NEGATIVE  NEGATIVE   Leukocytes, UA MODERATE (*) NEGATIVE  URINE MICROSCOPIC-ADD ON     Status: Abnormal   Collection Time    08/24/13  7:52 PM      Result Value Ref Range   Squamous Epithelial / LPF FEW (*) RARE   WBC, UA 11-20  <3 WBC/hpf   RBC / HPF 0-2  <3 RBC/hpf   Bacteria, UA RARE  RARE  POCT PREGNANCY, URINE     Status: Abnormal   Collection Time    08/24/13  8:09 PM      Result Value Ref Range   Preg Test, Ur POSITIVE (*) NEGATIVE  CBC     Status: None   Collection Time    08/24/13  9:04 PM      Result Value Ref Range   WBC 8.1  4.0 - 10.5 K/uL   RBC 4.10  3.87 - 5.11 MIL/uL   Hemoglobin 12.5  12.0 - 15.0 g/dL   HCT 16.137.1  09.636.0 - 04.546.0 %   MCV 90.5  78.0 - 100.0 fL   MCH 30.5  26.0 - 34.0 pg   MCHC 33.7  30.0 - 36.0 g/dL   RDW 40.913.6  81.111.5 - 91.415.5 %   Platelets 213  150 - 400 K/uL  HCG, QUANTITATIVE, PREGNANCY     Status: Abnormal   Collection Time    08/24/13  9:05 PM      Result Value Ref Range   hCG, Beta Chain, Quant, S [id number] (*) <5 mIU/mL  WET PREP, GENITAL     Status: Abnormal   Collection Time    08/25/13 12:05 AM      Result Value Ref Range   Yeast Wet Prep HPF POC NONE SEEN  NONE SEEN   Trich, Wet Prep NONE SEEN  NONE SEEN   Clue Cells  Wet Prep HPF POC NONE SEEN  NONE SEEN   WBC, Wet Prep HPF POC FEW (*) NONE SEEN   US Ob Comp Less 14 Wks  08/24/2013   CLINICAL DATA:  Pregnant patient with pelvic pain and vaginal spotting. Beta HCG level  58,132.  EXAM: OBSTETRIC <14 WK [place] AND TRANSVAGINAL OB US  TECHNIQUE: Both transabdominal and transvaginal ultrasound examinations were performed for complete evaluation of the gestation as well as the maternal uterus, adnexal regions, and pelvic cul-de-sac. Transvaginal technique was performed to assess early pregnancy.  COMPARISON:  None.  FINDINGS: Intrauterine gestational sac: Visualized/normal in shape.  Yolk sac:  Yes  Embryo:  Yes  Cardiac Activity: Yes  Heart Rate:  158 bpm  CRL:   15.4  mm   8 w 0 d                  [place] EDC: 04/05/2014  Maternal uterus/adnexae: Dominant, 2.9 cm, cyst in the right ovary. Ovaries otherwise unremarkable. No adnexal masses. No uterine masses. No free fluid.  IMPRESSION: 1. Single live intrauterine pregnancy with a measured gestational age of [redacted] weeks. No emergent fetal or maternal complication.   Electronically Signed   By: Amie Portland M.D.   On: 08/24/2013 23:39   US Ob Transvaginal  08/24/2013   CLINICAL DATA:  Pregnant patient with pelvic pain and vaginal spotting. Beta HCG level 58,132.  EXAM: OBSTETRIC <14 WK [place] AND TRANSVAGINAL OB US  TECHNIQUE: Both transabdominal and transvaginal ultrasound examinations were performed for complete evaluation of the gestation as well as the maternal uterus, adnexal regions, and pelvic cul-de-sac. Transvaginal technique was performed to assess early pregnancy.  COMPARISON:  None.  FINDINGS: Intrauterine gestational sac: Visualized/normal in shape.  Yolk sac:  Yes  Embryo:  Yes  Cardiac Activity: Yes  Heart Rate:  158 bpm  CRL:   15.4  mm   8 w 0 d                  [place] EDC: 04/05/2014  Maternal uterus/adnexae: Dominant, 2.9 cm, cyst in the right ovary. Ovaries otherwise unremarkable. No adnexal masses. No uterine masses. No free fluid.  IMPRESSION: 1. Single live intrauterine pregnancy with a measured gestational age of [redacted] weeks. No emergent fetal or maternal complication.   Electronically Signed   By: Amie Portland M.D.   On: 08/24/2013  23:39  GI cocktail- given  Assessment and Plan  abd pain in pregnancy viable IUP 8 weeks Epigastric pain-  Jean Rosenthal 08/24/2013, 8:59 PM

## 2013-08-24 NOTE — MAU Note (Signed)
Pt reports pain in left lower abd and lower back x 3 days. LMP 06/24/2013, positive  Home preg test.

## 2013-08-25 LAB — WET PREP, GENITAL
CLUE CELLS WET PREP: NONE SEEN
Trich, Wet Prep: NONE SEEN
Yeast Wet Prep HPF POC: NONE SEEN

## 2013-08-25 LAB — GC/CHLAMYDIA PROBE AMP
CT Probe RNA: NEGATIVE
GC PROBE AMP APTIMA: NEGATIVE

## 2013-08-25 MED ORDER — PROMETHAZINE HCL 25 MG PO TABS: 25.0000 mg | ORAL_TABLET | Freq: Four times a day (QID) | ORAL | Status: DC | PRN

## 2013-08-25 MED ORDER — GI COCKTAIL ~~LOC~~
30.0000 mL | Freq: Once | ORAL | Status: AC
  Administered 2013-08-25: 30 mL via ORAL
  Filled 2013-08-25: qty 30

## 2013-08-25 MED ORDER — FAMOTIDINE 20 MG PO TABS: 20.0000 mg | ORAL_TABLET | Freq: Two times a day (BID) | ORAL | Status: DC

## 2013-08-25 NOTE — MAU Provider Note (Signed)
Attestation of Attending Supervision of Advanced Practitioner (CNM/NP): Evaluation and management procedures were performed by the Advanced Practitioner under my supervision and collaboration.  I have reviewed the Advanced Practitioner's note and chart, and I agree with the management and plan.  Tacori Kvamme 08/25/2013 6:58 AM   

## 2013-09-18 ENCOUNTER — Emergency Department (HOSPITAL_COMMUNITY): Payer: BC Managed Care – PPO

## 2013-09-18 ENCOUNTER — Emergency Department (HOSPITAL_COMMUNITY)
Admission: EM | Admit: 2013-09-18 | Discharge: 2013-09-18 | Disposition: A | Discharge status: Home / Self Care | Payer: BC Managed Care – PPO | Attending: Emergency Medicine | Admitting: Emergency Medicine

## 2013-09-18 ENCOUNTER — Encounter (HOSPITAL_COMMUNITY): Payer: Self-pay | Admitting: Emergency Medicine

## 2013-09-18 DIAGNOSIS — Z79899 Other long term (current) drug therapy: Secondary | ICD-10-CM | POA: Diagnosis not present

## 2013-09-18 DIAGNOSIS — R1013 Epigastric pain: Secondary | ICD-10-CM

## 2013-09-18 DIAGNOSIS — M549 Dorsalgia, unspecified: Secondary | ICD-10-CM | POA: Diagnosis not present

## 2013-09-18 DIAGNOSIS — R1011 Right upper quadrant pain: Secondary | ICD-10-CM | POA: Diagnosis present

## 2013-09-18 HISTORY — DX: Recurrent pregnancy loss: N96

## 2013-09-18 LAB — COMPREHENSIVE METABOLIC PANEL
ALT: 17 U/L (ref 0–35)
AST: 22 U/L (ref 0–37)
Albumin: 3.7 g/dL (ref 3.5–5.2)
Alkaline Phosphatase: 58 U/L (ref 39–117)
BUN: 6 mg/dL (ref 6–23)
CO2: 21 meq/L (ref 19–32)
Calcium: 9.2 mg/dL (ref 8.4–10.5)
Chloride: 98 mEq/L (ref 96–112)
Creatinine, Ser: 0.31 mg/dL — ABNORMAL LOW (ref 0.50–1.10)
GFR calc Af Amer: >90 mL/min (ref >90)
GLUCOSE: 98 mg/dL (ref 70–99)
POTASSIUM: 4 meq/L (ref 3.7–5.3)
SODIUM: 135 meq/L — AB (ref 137–147)
Total Bilirubin: 0.3 mg/dL (ref 0.3–1.2)
Total Protein: 7.6 g/dL (ref 6.0–8.3)

## 2013-09-18 LAB — CBC WITH DIFFERENTIAL/PLATELET
Basophils Absolute: 0 10*3/uL (ref 0.0–0.1)
Basophils Relative: 0 % (ref 0–1)
EOS PCT: 1 % (ref 0–5)
Eosinophils Absolute: 0.1 10*3/uL (ref 0.0–0.7)
HCT: 38.9 % (ref 36.0–46.0)
Hemoglobin: 12.9 g/dL (ref 12.0–15.0)
LYMPHS ABS: 2.2 10*3/uL (ref 0.7–4.0)
LYMPHS PCT: 22 % (ref 12–46)
MCH: 30.1 pg (ref 26.0–34.0)
MCHC: 33.2 g/dL (ref 30.0–36.0)
MCV: 90.7 fL (ref 78.0–100.0)
Monocytes Absolute: 0.5 10*3/uL (ref 0.1–1.0)
Monocytes Relative: 5 % (ref 3–12)
NEUTROS PCT: 72 % (ref 43–77)
Neutro Abs: 7.2 10*3/uL (ref 1.7–7.7)
Platelets: 226 10*3/uL (ref 150–400)
RBC: 4.29 MIL/uL (ref 3.87–5.11)
RDW: 14.5 % (ref 11.5–15.5)
WBC: 10 10*3/uL (ref 4.0–10.5)

## 2013-09-18 LAB — URINE MICROSCOPIC-ADD ON

## 2013-09-18 LAB — URINALYSIS, ROUTINE W REFLEX MICROSCOPIC
BILIRUBIN URINE: NEGATIVE
Glucose, UA: NEGATIVE mg/dL
Hgb urine dipstick: NEGATIVE
KETONES UR: 40 mg/dL — AB
Nitrite: NEGATIVE
PROTEIN: NEGATIVE mg/dL
Specific Gravity, Urine: 1.022 (ref 1.005–1.030)
UROBILINOGEN UA: 0.2 mg/dL (ref 0.0–1.0)
pH: 5 (ref 5.0–8.0)

## 2013-09-18 LAB — LIPASE, BLOOD: Lipase: 23 U/L (ref 11–59)

## 2013-09-18 MED ORDER — SODIUM CHLORIDE 0.9 % IV BOLUS (SEPSIS)
1000.0000 mL | Freq: Once | INTRAVENOUS | Status: AC
  Administered 2013-09-18: 1000 mL via INTRAVENOUS

## 2013-09-18 MED ORDER — FENTANYL CITRATE 0.05 MG/ML IJ SOLN
50.0000 ug | INTRAMUSCULAR | Status: DC | PRN
  Administered 2013-09-18: 50 ug via INTRAVENOUS
  Filled 2013-09-18: qty 2

## 2013-09-18 MED ORDER — GI COCKTAIL ~~LOC~~
30.0000 mL | Freq: Once | ORAL | Status: AC
  Administered 2013-09-18: 30 mL via ORAL
  Filled 2013-09-18: qty 30

## 2013-09-18 MED ORDER — FENTANYL CITRATE 0.05 MG/ML IJ SOLN: 50.0000 ug | INTRAMUSCULAR | Status: DC | PRN

## 2013-09-18 NOTE — ED Notes (Signed)
The pain went away then returned

## 2013-09-18 NOTE — ED Notes (Signed)
1st liter still infusing

## 2013-09-18 NOTE — ED Provider Notes (Signed)
Care assumed from Dr. Jodi MourningZavitz.  33 yo pregnant female presenting initially with epigastric abdominal pain.  Per Dr. Jodi MourningZavitz, his repeat exam was more concerning for right sided and right lower quadrant abdominal pain.  Plan was for MRI to rule out appendicitis.  This test was attempted, but ultimately unable to be obtained due to claustrophobia.  On my repeat exam, pt had mild RUQ tenderness but no RLQ or left sided abdominal tenderness.  She also denied nausea/vomiting, fevers, and had a normal WBC count.  I think appendicitis is less likely than gastritis, based on her history.  However, I discussed with her the possibility of appendicitis and recommended close follow up for repeat abdominal exam and gave strict return precautions.   Ultimately, after lengthy ED observation, multiple repeat abdominal exams, and a reassuring workup otherwise, I think that she is safe for discharge home without further abdominal imaging.    Clinical Impression: 1. Epigastric pain       Amber ChurnJohn David Wofford III, MD 09/18/13 [phone]

## 2013-09-18 NOTE — Discharge Instructions (Signed)
Dolor abdominal en el embarazo °(Abdominal Pain During Pregnancy) °El dolor abdominal es frecuente durante el embarazo. Generalmente no causa ningún daño. El dolor abdominal puede tener numerosas causas. Algunas causas son más graves que otras. Ciertas causas de dolor abdominal durante el embarazo se diagnostican fácilmente. A veces, se tarda un tiempo para llegar al diagnóstico. Otras veces la causa no se conoce. El dolor abdominal puede estar relacionado con alguna alteración del embarazo, o puede deberse a una causa totalmente diferente. Por este motivo, siempre consulte a su médico cuando sienta molestias abdominales. °INSTRUCCIONES PARA EL CUIDADO EN EL HOGAR  °Esté atenta al dolor para ver si hay cambios. Las siguientes indicaciones ayudarán a aliviar cualquier molestia que pueda sentir: °· No tenga relaciones sexuales y no coloque nada dentro de la vagina hasta que los síntomas hayan desaparecido completamente. °· Descanse todo lo que pueda hasta que el dolor se le haya calmado. °· Si siente náuseas, beba líquidos claros. Evite los alimentos sólidos mientras sienta malestar o tenga náuseas. °· Tome sólo medicamentos de venta libre o recetados, según las indicaciones del médico. °· Cumpla con todas las visitas de control, según le indique su médico. °SOLICITE ATENCIÓN MÉDICA DE INMEDIATO SI: °· Tiene un sangrado, pérdida de líquidos o elimina tejidos por la vagina. °· El dolor o los cólicos aumentan. °· Tiene vómitos persistentes. °· Comienza a sentir dolor al orinar u observa sangre. °· Tiene fiebre. °· Nota que los movimientos del bebé disminuyen. °· Siente intensa debilidad o se marea. °· Tiene dificultad para respirar con o sin dolor abdominal. °· Siente un dolor de cabeza intenso junto al dolor abdominal. °· Tiene una secreción vaginal anormal con dolor abdominal. °· Tiene diarrea persistente. °· El dolor abdominal sigue o empeora aún después de hacer reposo. °ASEGÚRESE DE QUE:  °· Comprende estas  instrucciones. °· Controlará su afección. °· Recibirá ayuda de inmediato si no mejora o si empeora. °Document Released: 03/09/2005 Document Revised: 12/28/2012 °[employer]® Patient Information ©2015 [employer]. This information is not intended to replace advice given to you by your health care provider. Make sure you discuss any questions you have with your health care provider. ° °

## 2013-09-18 NOTE — ED Notes (Signed)
Called MRI/note was made in MRI that patient refused MRI/checked with pt to confirm and pt confirmed that she refused test

## 2013-09-18 NOTE — ED Notes (Addendum)
Patient is off the unit to MRI.

## 2013-09-18 NOTE — ED Provider Notes (Signed)
CSN: [id number]     Arrival date & time 09/18/13  0141 History   First MD Initiated Contact with Patient 09/18/13 0330     Chief Complaint  Patient presents with  . Abdominal Pain     (Consider location/radiation/quality/duration/timing/severity/associated sxs/prior Treatment) HPI Comments: 33 year old female G9 P4, miscarriage and large for gestational age previous pregnancies, vaginal deliveries, no abdominal surgery history presents with epigastric discomfort constant burning/ache since 7 PM this evening. No history of similar. No history of reflux, ulcers or gallbladder. No nausea or vomiting. Not worse with eating however patient has not eaten since the pain started. No lower bowel pain or vaginal bleeding. No cramping. Patient has not had a formal ultrasound done yet however has followup appointment. Patient is 12 weeks by dates.  Patient is a 33 y.o. female presenting with abdominal pain. The history is provided by the patient.  Abdominal Pain Associated symptoms: no chest pain, no chills, no dysuria, no fever, no nausea, no shortness of breath and no vomiting     Past Medical History  Diagnosis Date  . No pertinent past medical history   . Medical history non-contributory   . History of multiple spontaneous abortions    Past Surgical History  Procedure Laterality Date  . No past surgeries     Family History  Problem Relation Age of Onset  . Anesthesia problems Neg Hx   . Hypotension Neg Hx   . Malignant hyperthermia Neg Hx   . Pseudochol deficiency Neg Hx   . Other Neg Hx   . Alcohol abuse Neg Hx   . Arthritis Neg Hx   . Asthma Neg Hx   . Birth defects Neg Hx   . Cancer Neg Hx   . COPD Neg Hx   . Depression Neg Hx   . Diabetes Neg Hx   . Drug abuse Neg Hx   . Early death Neg Hx   . Hearing loss Neg Hx   . Heart disease Neg Hx   . Hyperlipidemia Neg Hx   . Hypertension Neg Hx   . Kidney disease Neg Hx   . Learning disabilities Neg Hx   . Mental illness Neg Hx    . Mental retardation Neg Hx   . Miscarriages / Stillbirths Neg Hx   . Stroke Neg Hx   . Vision loss Neg Hx    History  Substance Use Topics  . Smoking status: Never Smoker   . Smokeless tobacco: Never Used  . Alcohol Use: No   OB History   Grav Para Term Preterm Abortions TAB SAB Ect Mult Living   9 4 4  4  4   4      Review of Systems  Constitutional: Negative for fever and chills.  HENT: Negative for congestion.   Eyes: Negative for visual disturbance.  Respiratory: Negative for shortness of breath.   Cardiovascular: Negative for chest pain.  Gastrointestinal: Positive for abdominal pain. Negative for nausea, vomiting and blood in stool.  Genitourinary: Negative for dysuria and flank pain.  Musculoskeletal: Positive for back pain (mild lower). Negative for neck pain and neck stiffness.  Skin: Negative for rash.  Neurological: Negative for light-headedness and headaches.      Allergies  Codeine  Home Medications   Prior to Admission medications   Medication Sig Start Date End Date Taking? Authorizing Absalom Aro  Prenatal Vit-Fe Fumarate-FA (PRENATAL MULTIVITAMIN) TABS Take 1 tablet by mouth at bedtime.   Yes Historical Ulla Mckiernan, MD  promethazine (PHENERGAN) 25 MG  tablet Take 25 mg by mouth every 6 (six) hours as needed for nausea or vomiting.   Yes Historical Crescent Gotham, MD   BP 118/69  Pulse 90  Temp(Src) 97.9 F (36.6 C) (Oral)  Resp 15  Ht 5' (1.524 m)  Wt 191 lb 14.4 oz (87.045 kg)  BMI 37.48 kg/m2  SpO2 99%  LMP 06/24/2013 Physical Exam  Nursing note and vitals reviewed. Constitutional: She is oriented to person, place, and time. She appears well-developed and well-nourished.  HENT:  Head: Normocephalic and atraumatic.  Mild dry mucous membranes  Eyes: Conjunctivae are normal. Right eye exhibits no discharge. Left eye exhibits no discharge.  Neck: Normal range of motion. Neck supple. No tracheal deviation present.  Cardiovascular: Normal rate and regular  rhythm.   Pulmonary/Chest: Effort normal and breath sounds normal.  Abdominal: Soft. She exhibits no distension. There is tenderness (epigastric and right upper quadrant mild). There is no guarding.  Musculoskeletal: She exhibits no edema.  Neurological: She is alert and oriented to person, place, and time.  Skin: Skin is warm. No rash noted.  Psychiatric: She has a normal mood and affect.    ED Course  Procedures (including critical care time)  EMERGENCY DEPARTMENT BILIARY ULTRASOUND INTERPRETATION "Study: Limited Abdominal Ultrasound of the gallbladder and common bile duct."  INDICATIONS: Abdominal pain, RUQ pain and Nausea Indication: Multiple views of the gallbladder and common bile duct were obtained in real-time with a Multi-frequency probe." PERFORMED BY:  Myself IMAGES ARCHIVED?: Yes FINDINGS: Gallstones absent, Gallbladder wall normal in thickness and Sonographic Murphy's sign absent LIMITATIONS: Body Habitus and Bowel Gas INTERPRETATION: Normal  EMERGENCY DEPARTMENT [place] PREGNANCY "Study: Limited Ultrasound of the Pelvis for Pregnancy"  INDICATIONS:Pregnancy(required) abd pain Multiple views of the uterus and pelvic cavity were obtained in real-time with a multi-frequency probe.  APPROACH:Transabdominal   PERFORMED BY: Myself  IMAGES ARCHIVED?: Yes  LIMITATIONS: Body habitus   PREGNANCY FINDINGS: Intrauterine gestational sac noted, Fetal pole present and Fetal heart activity seen  INTERPRETATION: Viable intrauterine pregnancy  GESTATIONAL AGE, ESTIMATE: 12 wk  FETAL HEART RATE: 140s     Labs Review Labs Reviewed  COMPREHENSIVE METABOLIC PANEL - Abnormal; Notable for the following:    Sodium 135 (*)    Creatinine, Ser 0.31 (*)    All other components within normal limits  URINALYSIS, ROUTINE W REFLEX MICROSCOPIC - Abnormal; Notable for the following:    Ketones, ur 40 (*)    Leukocytes, UA SMALL (*)    All other components within normal limits  URINE  CULTURE  CBC WITH DIFFERENTIAL  URINE MICROSCOPIC-ADD ON  LIPASE, BLOOD    Imaging Review US Abdomen Complete  09/18/2013   CLINICAL DATA:  Abdominal pain  EXAM: ULTRASOUND ABDOMEN COMPLETE  COMPARISON:  None.  FINDINGS: Gallbladder:  No gallstones or wall thickening visualized. No sonographic Murphy sign noted.  Common bile duct:  Diameter: 2 mm.  Liver:  No focal lesion identified. Within normal limits in parenchymal echogenicity. Antegrade flow in the imaged portal venous system  IVC:  Limited visualization.  No abnormality visualized.  Pancreas:  Visualized portion unremarkable.  Spleen:  Size and appearance within normal limits.  Right Kidney:  Length: 13 cm. Echogenicity within normal limits. No mass or hydronephrosis visualized.  Left Kidney:  Length: 13 cm. Echogenicity within normal limits. No mass or hydronephrosis visualized.  Abdominal aorta:  No aneurysm visualized.  Other findings:  None.  IMPRESSION: Negative abdominal ultrasound.   Electronically Signed   By: Tiburcio Pea  M.D.   On: 09/18/2013 05:37     EKG Interpretation None      MDM   Final diagnoses:  None   Patient presents with clinically reflux versus gallbladder pathology. Patient held the cipher multiple previous pregnancies. No history of in vitro or assistance required for pregnancy no history of ectopic. Patient has no lower bowel pain or vaginal symptoms. Bedside ultrasound showed pregnancy approximately [redacted] weeks gestation normal fetal heart rate and good fetal movement intrauterine. Formal abdominal ultrasound ordered for further evaluation of pain as well as pain medicines and acid medication.  On recheck patient's pain is moving to the right lower corner. With pregnancy plan to hold on CT scan and ordered MRI for further delineation. Patient Care will be signed out to followup MRI results and recheck for final disposition.  Abdominal pain, pregnancy     Enid SkeensJoshua M Zavitz, MD 09/18/13 [phone]

## 2013-09-18 NOTE — ED Notes (Signed)
Pt. reports mid abdominal pain radiating to lower back onset this evening , denies nausea/vomitting or diarrhea , no fever or chills, pt. stated she is 3 months pregnant (G9P4) , no vaginal bleeding or discharge.

## 2013-09-18 NOTE — ED Notes (Signed)
Pt states (though interpreter) that she feels better and is ready to go home.

## 2013-09-18 NOTE — ED Notes (Signed)
Pt in xray

## 2013-09-18 NOTE — Progress Notes (Signed)
Interpreter Wyvonnia DuskyGraciela Namihira for RN @ ED

## 2013-09-18 NOTE — ED Notes (Signed)
Patient returned to exam room

## 2013-09-19 LAB — URINE CULTURE

## 2013-10-02 ENCOUNTER — Ambulatory Visit (INDEPENDENT_AMBULATORY_CARE_PROVIDER_SITE_OTHER): Payer: BC Managed Care – PPO | Admitting: Obstetrics & Gynecology

## 2013-10-02 ENCOUNTER — Encounter: Payer: Self-pay | Admitting: Obstetrics & Gynecology

## 2013-10-02 VITALS — BP 106/71 | HR 96 | Wt 191.8 lb

## 2013-10-02 DIAGNOSIS — Z124 Encounter for screening for malignant neoplasm of cervix: Secondary | ICD-10-CM

## 2013-10-02 DIAGNOSIS — Z1151 Encounter for screening for human papillomavirus (HPV): Secondary | ICD-10-CM

## 2013-10-02 DIAGNOSIS — Z3480 Encounter for supervision of other normal pregnancy, unspecified trimester: Secondary | ICD-10-CM

## 2013-10-02 DIAGNOSIS — Z8632 Personal history of gestational diabetes: Principal | ICD-10-CM

## 2013-10-02 DIAGNOSIS — Z113 Encounter for screening for infections with a predominantly sexual mode of transmission: Secondary | ICD-10-CM

## 2013-10-02 DIAGNOSIS — O09292 Supervision of pregnancy with other poor reproductive or obstetric history, second trimester: Secondary | ICD-10-CM

## 2013-10-02 DIAGNOSIS — Z3492 Encounter for supervision of normal pregnancy, unspecified, second trimester: Secondary | ICD-10-CM

## 2013-10-02 DIAGNOSIS — O0990 Supervision of high risk pregnancy, unspecified, unspecified trimester: Secondary | ICD-10-CM | POA: Insufficient documentation

## 2013-10-02 LAB — POCT URINALYSIS DIP (DEVICE)
Bilirubin Urine: NEGATIVE
Glucose, UA: NEGATIVE mg/dL
Hgb urine dipstick: NEGATIVE
Ketones, ur: NEGATIVE mg/dL
Leukocytes, UA: NEGATIVE
Nitrite: NEGATIVE
PROTEIN: 30 mg/dL — AB
SPECIFIC GRAVITY, URINE: 1.025 (ref 1.005–1.030)
UROBILINOGEN UA: 0.2 mg/dL (ref 0.0–1.0)
pH: 5.5 (ref 5.0–8.0)

## 2013-10-02 MED ORDER — PROMETHAZINE HCL 25 MG PO TABS: 25.0000 mg | ORAL_TABLET | Freq: Four times a day (QID) | ORAL | Status: DC | PRN

## 2013-10-02 MED ORDER — PRENATAL MULTIVITAMIN CH: 1.0000 | ORAL_TABLET | Freq: Every day | ORAL | Status: DC

## 2013-10-02 NOTE — Patient Instructions (Signed)
Second Trimester of Pregnancy The second trimester is from week 13 through week 28, months 4 through 6. The second trimester is often a time when you feel your best. Your body has also adjusted to being pregnant, and you begin to feel better physically. Usually, morning sickness has lessened or quit completely, you may have more energy, and you may have an increase in appetite. The second trimester is also a time when the fetus is growing rapidly. At the end of the sixth month, the fetus is about 9 inches long and weighs about 1 pounds. You will likely begin to feel the baby move (quickening) between 18 and 20 weeks of the pregnancy. BODY CHANGES Your body goes through many changes during pregnancy. The changes vary from woman to woman.   Your weight will continue to increase. You will notice your lower abdomen bulging out.  You may begin to get stretch marks on your hips, abdomen, and breasts.  You may develop headaches that can be relieved by medicines approved by your health care provider.  You may urinate more often because the fetus is pressing on your bladder.  You may develop or continue to have heartburn as a result of your pregnancy.  You may develop constipation because certain hormones are causing the muscles that push waste through your intestines to slow down.  You may develop hemorrhoids or swollen, bulging veins (varicose veins).  You may have back pain because of the weight gain and pregnancy hormones relaxing your joints between the bones in your pelvis and as a result of a shift in weight and the muscles that support your balance.  Your breasts will continue to grow and be tender.  Your gums may bleed and may be sensitive to brushing and flossing.  Dark spots or blotches (chloasma, mask of pregnancy) may develop on your face. This will likely fade after the baby is born.  A dark line from your belly button to the pubic area (linea nigra) may appear. This will likely fade  after the baby is born.  You may have changes in your hair. These can include thickening of your hair, rapid growth, and changes in texture. Some women also have hair loss during or after pregnancy, or hair that feels dry or thin. Your hair will most likely return to normal after your baby is born. WHAT TO EXPECT AT YOUR PRENATAL VISITS During a routine prenatal visit:  You will be weighed to make sure you and the fetus are growing normally.  Your blood pressure will be taken.  Your abdomen will be measured to track your baby's growth.  The fetal heartbeat will be listened to.  Any test results from the previous visit will be discussed. Your health care provider may ask you:  How you are feeling.  If you are feeling the baby move.  If you have had any abnormal symptoms, such as leaking fluid, bleeding, severe headaches, or abdominal cramping.  If you have any questions. Other tests that may be performed during your second trimester include:  Blood tests that check for:  Low iron levels (anemia).  Gestational diabetes (between 24 and 28 weeks).  Rh antibodies.  Urine tests to check for infections, diabetes, or protein in the urine.  An ultrasound to confirm the proper growth and development of the baby.  An amniocentesis to check for possible genetic problems.  Fetal screens for spina bifida and Down syndrome. HOME CARE INSTRUCTIONS   Avoid all smoking, herbs, alcohol, and unprescribed   drugs. These chemicals affect the formation and growth of the baby.  Follow your health care provider's instructions regarding medicine use. There are medicines that are either safe or unsafe to take during pregnancy.  Exercise only as directed by your health care provider. Experiencing uterine cramps is a good sign to stop exercising.  Continue to eat regular, healthy meals.  Wear a good support bra for breast tenderness.  Do not use hot tubs, steam rooms, or saunas.  Wear your  seat belt at all times when driving.  Avoid raw meat, uncooked cheese, cat litter boxes, and soil used by cats. These carry germs that can cause birth defects in the baby.  Take your prenatal vitamins.  Try taking a stool softener (if your health care provider approves) if you develop constipation. Eat more high-fiber foods, such as fresh vegetables or fruit and whole grains. Drink plenty of fluids to keep your urine clear or pale yellow.  Take warm sitz baths to soothe any pain or discomfort caused by hemorrhoids. Use hemorrhoid cream if your health care provider approves.  If you develop varicose veins, wear support hose. Elevate your feet for 15 minutes, 3-4 times a day. Limit salt in your diet.  Avoid heavy lifting, wear low heel shoes, and practice good posture.  Rest with your legs elevated if you have leg cramps or low back pain.  Visit your dentist if you have not gone yet during your pregnancy. Use a soft toothbrush to brush your teeth and be gentle when you floss.  A sexual relationship may be continued unless your health care provider directs you otherwise.  Continue to go to all your prenatal visits as directed by your health care provider. SEEK MEDICAL CARE IF:   You have dizziness.  You have mild pelvic cramps, pelvic pressure, or nagging pain in the abdominal area.  You have persistent nausea, vomiting, or diarrhea.  You have a bad smelling vaginal discharge.  You have pain with urination. SEEK IMMEDIATE MEDICAL CARE IF:   You have a fever.  You are leaking fluid from your vagina.  You have spotting or bleeding from your vagina.  You have severe abdominal cramping or pain.  You have rapid weight gain or loss.  You have shortness of breath with chest pain.  You notice sudden or extreme swelling of your face, hands, ankles, feet, or legs.  You have not felt your baby move in over an hour.  You have severe headaches that do not go away with  medicine.  You have vision changes. Document Released: 03/03/2001 Document Revised: 03/14/2013 Document Reviewed: 05/10/2012 Exit[employer] Patient Information 2015 [employer]. This information is not intended to replace advice given to you by your health care provider. Make sure you discuss any questions you have with your health care provider.  

## 2013-10-02 NOTE — Progress Notes (Signed)
Patient has certain lmp Last pap in 2012.

## 2013-10-02 NOTE — Progress Notes (Signed)
Pt seen today for first OB visit nutrition consult.  Hx of GDM.  Obese woman pregravid.  1.2# loss from reported pregravid wt at 8672w2d.  Complains of N/V and heartburn.  NKFA.  She reports eating 3 meals/day plus fruits and vegetables for snacks. Limits intake of soda and other sweets since previous pregnancy with GDM. Taking PNV and med for heartburn.  Walking for exercise some days.  Discussed wt gain goal of 11-20#, or 1/2# per week for remainder of pregnancy, and continuing to exercise 30 minutes most days.  [facility] [place] made her a certification appointment and gave written and verbal information about what to bring to appointment.  Follow-up if referred.  Melanee LeftErin Cashwell, MPH RD LDN

## 2013-10-02 NOTE — Progress Notes (Signed)
New OB, gestational DM previous preg,  1 Hr today  Subjective: new OB, wants Quad screen, h/o GDM    Amber Branch is a [id number] [redacted]w[redacted]d being seen today for her first obstetrical visit.  Her obstetrical history is significant for previous GDM. Patient does intend to breast feed. Pregnancy history fully reviewed.  Patient reports nausea.  Filed Vitals:   10/02/13 0912  BP: 106/71  Pulse: 96  Weight: 191 lb 12.8 oz (87 kg)    HISTORY: OB History  Gravida Para Term Preterm AB SAB TAB Ectopic Multiple Living  9 4 4  4 4    4     # Outcome Date GA Lbr Len/2nd Weight Sex Delivery Anes PTL Lv  9 CUR           8 TRM 05/09/12 [redacted]w[redacted]d 41:59 / 03:47 8 lb 0.4 oz (3.64 kg) F SVD EPI  Y     Comments: [id number]  7 SAB 06/2011 [redacted]w[redacted]d       N  6 SAB 02/2011 [redacted]w[redacted]d       N     Comments: System Generated. Please review and update pregnancy details.  5 SAB 05/2009 [redacted]w[redacted]d       N  4 TRM 04/2008   8 lb 2 oz (3.685 kg) F SVD  N Y  3 SAB 2008 [redacted]w[redacted]d       N  2 TRM 08/18/02   7 lb 2 oz (3.232 kg) F SVD   Y  1 TRM 07/2000 [redacted]w[redacted]d  8 lb 3 oz (3.714 kg) M SVD  N Y     Past Medical History  Diagnosis Date  . No pertinent past medical history   . History of multiple spontaneous abortions   . Gestational diabetes    Past Surgical History  Procedure Laterality Date  . No past surgeries     Family History  Problem Relation Age of Onset  . Anesthesia problems Neg Hx   . Hypotension Neg Hx   . Malignant hyperthermia Neg Hx   . Pseudochol deficiency Neg Hx   . Other Neg Hx   . Alcohol abuse Neg Hx   . Arthritis Neg Hx   . Asthma Neg Hx   . Birth defects Neg Hx   . Cancer Neg Hx   . COPD Neg Hx   . Depression Neg Hx   . Diabetes Neg Hx   . Drug abuse Neg Hx   . Early death Neg Hx   . Hearing loss Neg Hx   . Heart disease Neg Hx   . Hyperlipidemia Neg Hx   . Hypertension Neg Hx   . Kidney disease Neg Hx   . Learning disabilities Neg Hx   . Mental illness Neg Hx   . Mental retardation Neg Hx   .  Miscarriages / Stillbirths Neg Hx   . Stroke Neg Hx   . Vision loss Neg Hx      Exam    Uterus:  Fundal Height: 15 cm  Pelvic Exam:    Perineum: No Hemorrhoids   Vulva: normal   Vagina:  normal mucosa   pH:     Cervix: no lesions   Adnexa: normal adnexa   Bony Pelvis: average  System: Breast:  normal appearance, no masses or tenderness,     Skin: normal coloration and turgor, no rashes    Neurologic: oriented, normal   Extremities: normal strength, tone, and muscle mass   HEENT neck supple with midline trachea  Mouth/Teeth mucous membranes moist, pharynx normal without lesions   Neck supple   Cardiovascular: regular rate and rhythm, no murmurs or gallops   Respiratory:  appears well, vitals normal, no respiratory distress, acyanotic, normal RR, neck free of mass or lymphadenopathy, chest clear, no wheezing, crepitations, rhonchi, normal symmetric air entry   Abdomen: soft, non-tender; bowel sounds normal; no masses,  no organomegaly   Urinary: urethral meatus normal      Assessment:    Pregnancy: [id number] Patient Active Problem List   Diagnosis Date Noted  . Supervision of low-risk pregnancy 10/02/2013  . Right tennis elbow 06/20/2013  . Insomnia 06/20/2013  . Family planning 04/28/2013  . Dysthymia 04/28/2013  . 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 Low back pain radiating to left leg 12/09/2012  . Hypertriglyceridemia 01/09/2011        Plan:     Initial labs drawn. Prenatal vitamins. Problem list reviewed and updated. Genetic Screening discussed Quad Screen: ordered.  Ultrasound discussed; fetal survey: 18-20 weeks.  Follow up in 4 weeks. 50% of 30 min visit spent on counseling and coordination of care.     ARNOLD,JAMES 10/02/2013

## 2013-10-03 LAB — PRESCRIPTION MONITORING PROFILE (19 PANEL)
Amphetamine/Meth: NEGATIVE ng/mL
Barbiturate Screen, Urine: NEGATIVE ng/mL
Benzodiazepine Screen, Urine: NEGATIVE ng/mL
Buprenorphine, Urine: NEGATIVE ng/mL
CARISOPRODOL, URINE: NEGATIVE ng/mL
CREATININE, URINE: 164.03 mg/dL (ref >20.0)
Cannabinoid Scrn, Ur: NEGATIVE ng/mL
Cocaine Metabolites: NEGATIVE ng/mL
Fentanyl, Ur: NEGATIVE ng/mL
MDMA URINE: NEGATIVE ng/mL
MEPERIDINE UR: NEGATIVE ng/mL
METHADONE SCREEN, URINE: NEGATIVE ng/mL
Methaqualone: NEGATIVE ng/mL
NITRITES URINE, INITIAL: NEGATIVE ug/mL
OXYCODONE SCRN UR: NEGATIVE ng/mL
Opiate Screen, Urine: NEGATIVE ng/mL
PROPOXYPHENE: NEGATIVE ng/mL
Phencyclidine, Ur: NEGATIVE ng/mL
Tapentadol, urine: NEGATIVE ng/mL
Tramadol Scrn, Ur: NEGATIVE ng/mL
Zolpidem, Urine: NEGATIVE ng/mL
pH, Initial: 6 pH (ref 4.5–8.9)

## 2013-10-03 LAB — OBSTETRIC PANEL
ANTIBODY SCREEN: NEGATIVE
BASOS ABS: 0 10*3/uL (ref 0.0–0.1)
BASOS PCT: 0 % (ref 0–1)
EOS ABS: 0.1 10*3/uL (ref 0.0–0.7)
EOS PCT: 1 % (ref 0–5)
HCT: 35.6 % — ABNORMAL LOW (ref 36.0–46.0)
Hemoglobin: 12.1 g/dL (ref 12.0–15.0)
Hepatitis B Surface Ag: NEGATIVE
Lymphocytes Relative: 22 % (ref 12–46)
Lymphs Abs: 1.2 10*3/uL (ref 0.7–4.0)
MCH: 30 pg (ref 26.0–34.0)
MCHC: 34 g/dL (ref 30.0–36.0)
MCV: 88.3 fL (ref 78.0–100.0)
MONO ABS: 0.2 10*3/uL (ref 0.1–1.0)
Monocytes Relative: 4 % (ref 3–12)
NEUTROS ABS: 3.9 10*3/uL (ref 1.7–7.7)
Neutrophils Relative %: 73 % (ref 43–77)
Platelets: 202 10*3/uL (ref 150–400)
RBC: 4.03 MIL/uL (ref 3.87–5.11)
RDW: 15.3 % (ref 11.5–15.5)
Rh Type: POSITIVE
Rubella: 28.6 Index — ABNORMAL HIGH (ref <0.90)
WBC: 5.3 10*3/uL (ref 4.0–10.5)

## 2013-10-03 LAB — CYTOLOGY - PAP

## 2013-10-03 LAB — GLUCOSE TOLERANCE, 1 HOUR (50G) W/O FASTING: GLUCOSE 1 HOUR GTT: 146 mg/dL — AB (ref 70–140)

## 2013-10-03 LAB — HIV ANTIBODY (ROUTINE TESTING W REFLEX): HIV 1&2 Ab, 4th Generation: NONREACTIVE

## 2013-10-04 LAB — AFP, QUAD SCREEN
AFP: 7 [IU]/mL
CURR GEST AGE: 14.2 wks.days
HCG, Total: 24367 m[IU]/mL
INH: 193.9 pg/mL
Interpretation-AFP: POSITIVE — AB
MoM for AFP: 0.34
MoM for INH: 1.17
MoM for hCG: 0.77
Open Spina bifida: NEGATIVE
Tri 18 Scr Risk Est: NEGATIVE
Trisomy 18 (Edward) Syndrome Interp.: 1:2010 {titer}
uE3 Mom: 0.76
uE3 Value: 0.2 ng/mL

## 2013-10-04 LAB — CULTURE, OB URINE: Colony Count: 35000

## 2013-10-05 ENCOUNTER — Telehealth: Payer: Self-pay | Admitting: *Deleted

## 2013-10-05 NOTE — Telephone Encounter (Signed)
Contacted patient with Dori the interpreter, appointment date/time given with special instructions.  Pt verbalizes understanding.

## 2013-10-05 NOTE — Telephone Encounter (Signed)
Attempted to contact patient with the aide of [employer], no answer with both numbers, message left for patient to contact office for results.  Appointment for 3 hour gtt has been scheduled for 7/21 @ 0800, we need to inform patient.

## 2013-10-05 NOTE — Telephone Encounter (Signed)
Message copied by Dorothyann PengHAIZLIP, Marialice Newkirk E on Thu Oct 05, 2013  8:03 AM ------      Message from: Odelia GageLINTON, CHERYL A      Created: Wed Oct 04, 2013  4:40 PM       Please call patient with appointment.            Thanks             ----- Message -----         From: Adam PhenixJames G Arnold, MD         Sent: 10/04/2013  12:56 PM           To: [facility] Admin Pool            Please schedule 3 hr GTT       ------

## 2013-10-09 ENCOUNTER — Telehealth: Payer: Self-pay

## 2013-10-09 NOTE — Telephone Encounter (Signed)
Message copied by Louanna RawAMPBELL, Debarah Mccumbers M on Mon Oct 09, 2013  9:45 AM ------      Message from: Adam PhenixARNOLD, JAMES G      Created: Fri Oct 06, 2013  2:25 PM       Abnl Quad screen inc DSR needs [facility] genetic counseling ------

## 2013-10-09 NOTE — Telephone Encounter (Signed)
Genetic counseling appointment and complete anatomy ultrasound made for 10/27/13 at 1400. Patient needs to be informed.

## 2013-10-10 ENCOUNTER — Other Ambulatory Visit: Payer: Self-pay | Admitting: Family Medicine

## 2013-10-10 ENCOUNTER — Other Ambulatory Visit: Payer: BC Managed Care – PPO

## 2013-10-10 DIAGNOSIS — O280 Abnormal hematological finding on antenatal screening of mother: Secondary | ICD-10-CM

## 2013-10-10 DIAGNOSIS — R7309 Other abnormal glucose: Secondary | ICD-10-CM

## 2013-10-10 DIAGNOSIS — Z3689 Encounter for other specified antenatal screening: Secondary | ICD-10-CM

## 2013-10-11 LAB — GLUCOSE TOLERANCE, 3 HOURS
GLUCOSE, 1 HOUR-GESTATIONAL: 218 mg/dL — AB (ref 70–189)
GLUCOSE, 2 HOUR-GESTATIONAL: 142 mg/dL (ref 70–164)
Glucose Tolerance, Fasting: 89 mg/dL (ref 70–104)
Glucose, GTT - 3 Hour: 124 mg/dL (ref 70–144)

## 2013-10-12 NOTE — Telephone Encounter (Signed)
Called pt with Spanish, interpreter Byrd HesselbachMaria, and informed pt that her quad is abnormal and that a genetic counseling and US has been scheduled for 10/27/13 @ 1400 at [facility].  Pt stated understanding with no further questions.

## 2013-10-27 ENCOUNTER — Ambulatory Visit (HOSPITAL_COMMUNITY)
Admission: RE | Admit: 2013-10-27 | Discharge: 2013-10-27 | Disposition: A | Discharge status: Home / Self Care | Payer: BC Managed Care – PPO | Source: Ambulatory Visit | Attending: Obstetrics & Gynecology | Admitting: Obstetrics & Gynecology

## 2013-10-27 ENCOUNTER — Encounter (HOSPITAL_COMMUNITY): Payer: Self-pay

## 2013-10-27 DIAGNOSIS — O289 Unspecified abnormal findings on antenatal screening of mother: Secondary | ICD-10-CM | POA: Insufficient documentation

## 2013-10-27 DIAGNOSIS — O3510X0 Maternal care for (suspected) chromosomal abnormality in fetus, unspecified, not applicable or unspecified: Secondary | ICD-10-CM | POA: Insufficient documentation

## 2013-10-27 DIAGNOSIS — O280 Abnormal hematological finding on antenatal screening of mother: Secondary | ICD-10-CM

## 2013-10-27 DIAGNOSIS — Z3689 Encounter for other specified antenatal screening: Secondary | ICD-10-CM

## 2013-10-27 DIAGNOSIS — IMO0002 Reserved for concepts with insufficient information to code with codable children: Secondary | ICD-10-CM | POA: Insufficient documentation

## 2013-10-27 DIAGNOSIS — O351XX0 Maternal care for (suspected) chromosomal abnormality in fetus, not applicable or unspecified: Secondary | ICD-10-CM | POA: Insufficient documentation

## 2013-10-27 NOTE — Progress Notes (Signed)
Genetic Counseling  High-Risk Gestation Note  Appointment Date:  10/27/2013 Referred By: Tereso NewcomerAnyanwu, Ugonna A, MD Date of Birth:  18-Mar-1981 Partner:  Veatrice Bourbonctavio Toledo    Pregnancy History: [id number]: [id number] Estimated Date of Delivery: 03/31/14 Estimated Gestational Age: 7349w6d Attending: Particia NearingMartha Decker, MD   [name] and her husband, Mr. Veatrice BourbonOctavio Toledo, were seen for genetic counseling because of an increased risk for fetal Down syndrome based [facility]. [facility] Spanish/English interpreter provided interpretation for today's visit.   They were counseled regarding the Quad screen result and the associated 1 in 124 risk for fetal Down syndrome.  We reviewed chromosomes, nondisjunction, and the common features and variable prognosis of Down syndrome.  In addition, we reviewed the screen adjusted reduction in risks for trisomy 18  and ONTDs.  We also discussed other explanations for a screen positive result including: differences in maternal metabolism and normal variation. We repeatedly discussed that this screening is not diagnostic for Down syndrome but provides a risk assessment. [name] was very tearful throughout the discussion.   Detailed ultrasound was performed today. There were no visualized fetal anomalies or markers suggestive of aneuploidy. Complete ultrasound results reported separately.   We reviewed available screening options including noninvasive prenatal screening (NIPS)/cell free DNA (cfDNA) testing and detailed ultrasound.  They were counseled that screening tests are used to modify a patient's a priori risk for aneuploidy, typically based on age. This estimate provides a pregnancy specific risk assessment. We reviewed the benefits and limitations of each option. Specifically, we discussed the conditions for which each test screens, the detection rates, and false positive rates of each. They were also counseled regarding diagnostic  testing via amniocentesis. We reviewed the approximate 1 in 300-500 risk for complications for amniocentesis, including spontaneous pregnancy loss. After consideration of all the options, they declined NIPS and amniocentesis. The father of the pregnancy stated that they preferred to wait until the baby was born to obtain additional information regarding the presence or absence of Down syndrome.  They understand that screening tests cannot rule out all birth defects or genetic syndromes.  Detailed family history was declined by the patient and her partner given that the patient was very tearful throughout the appointment. They reported no known relatives with birth defects, intellectual disability, or known genetic conditions.  Without further information regarding the provided family history, an accurate genetic risk cannot be calculated. Further genetic counseling is warranted if more information is obtained.  Ms. Amber BarmanBrenda Cruz-Toledo denied exposure to environmental toxins or chemical agents. She denied the use of alcohol, tobacco or street drugs. She denied significant viral illnesses during the course of her pregnancy. Her medical and surgical histories were contributory for 4 previous spontaneous abortions. This history was not discussed today given the nature of today's discussion and the patient's tearful state.    I counseled this couple for approximately 35 minutes regarding the above risks and available options.   Quinn PlowmanKaren Kimiya Brunelle, MS,  Certified Genetic Counselor 10/27/2013

## 2013-10-30 ENCOUNTER — Ambulatory Visit (INDEPENDENT_AMBULATORY_CARE_PROVIDER_SITE_OTHER): Payer: BC Managed Care – PPO | Admitting: Obstetrics & Gynecology

## 2013-10-30 VITALS — BP 93/56 | HR 92 | Temp 97.5°F | Wt 193.8 lb

## 2013-10-30 DIAGNOSIS — Z3480 Encounter for supervision of other normal pregnancy, unspecified trimester: Secondary | ICD-10-CM

## 2013-10-30 DIAGNOSIS — Z3492 Encounter for supervision of normal pregnancy, unspecified, second trimester: Secondary | ICD-10-CM

## 2013-10-30 LAB — POCT URINALYSIS DIP (DEVICE)
Bilirubin Urine: NEGATIVE
Glucose, UA: NEGATIVE mg/dL
HGB URINE DIPSTICK: NEGATIVE
KETONES UR: NEGATIVE mg/dL
Nitrite: NEGATIVE
PROTEIN: NEGATIVE mg/dL
Specific Gravity, Urine: 1.015 (ref 1.005–1.030)
Urobilinogen, UA: 0.2 mg/dL (ref 0.0–1.0)
pH: 5.5 (ref 5.0–8.0)

## 2013-10-30 NOTE — Patient Instructions (Signed)
Return to clinic for any obstetric concerns or go to [facility] for evaluation  

## 2013-10-30 NOTE — Progress Notes (Signed)
Patient is Spanish-speaking only, Spanish interpreter present for this encounter. On review of records, 1 hr GTT 146, 3 hr [phone].  Only one abnormal value, no GDM for now.  Has history of abnormal GTT (1 value also) in previous pregnancy, had negative HgA1C (5.3) in between pregnancies.  Given history, recommend repeating 3 hr GTT around 24-28 weeks. Recommend diabetic diet in order to avoid macrosomia, glycemic related complications. Quad screen showed elevated AFP, increased ROD 1:124. No U/S findings, had genetic counseling, declined NIPS/amniocentesis No other complaints or concerns.  Routine obstetric precautions reviewed.

## 2013-12-04 ENCOUNTER — Other Ambulatory Visit: Payer: Self-pay | Admitting: Family Medicine

## 2013-12-04 DIAGNOSIS — O289 Unspecified abnormal findings on antenatal screening of mother: Secondary | ICD-10-CM

## 2013-12-04 DIAGNOSIS — O09299 Supervision of pregnancy with other poor reproductive or obstetric history, unspecified trimester: Secondary | ICD-10-CM

## 2013-12-04 DIAGNOSIS — O2622 Pregnancy care for patient with recurrent pregnancy loss, second trimester: Secondary | ICD-10-CM

## 2013-12-04 DIAGNOSIS — O99210 Obesity complicating pregnancy, unspecified trimester: Secondary | ICD-10-CM

## 2013-12-04 DIAGNOSIS — E669 Obesity, unspecified: Secondary | ICD-10-CM

## 2013-12-08 ENCOUNTER — Encounter (HOSPITAL_COMMUNITY): Payer: Self-pay

## 2013-12-08 ENCOUNTER — Ambulatory Visit (HOSPITAL_COMMUNITY)
Admission: RE | Admit: 2013-12-08 | Discharge: 2013-12-08 | Disposition: A | Discharge status: Home / Self Care | Payer: BC Managed Care – PPO | Source: Ambulatory Visit | Attending: Physician Assistant | Admitting: Physician Assistant

## 2013-12-08 VITALS — BP 113/70 | HR 96 | Wt 197.0 lb

## 2013-12-08 DIAGNOSIS — O2620 Pregnancy care for patient with recurrent pregnancy loss, unspecified trimester: Secondary | ICD-10-CM | POA: Diagnosis present

## 2013-12-08 DIAGNOSIS — O09299 Supervision of pregnancy with other poor reproductive or obstetric history, unspecified trimester: Secondary | ICD-10-CM

## 2013-12-08 DIAGNOSIS — O289 Unspecified abnormal findings on antenatal screening of mother: Secondary | ICD-10-CM | POA: Diagnosis not present

## 2013-12-08 DIAGNOSIS — O2622 Pregnancy care for patient with recurrent pregnancy loss, second trimester: Secondary | ICD-10-CM

## 2013-12-08 DIAGNOSIS — O99210 Obesity complicating pregnancy, unspecified trimester: Secondary | ICD-10-CM

## 2013-12-08 DIAGNOSIS — E669 Obesity, unspecified: Secondary | ICD-10-CM

## 2013-12-13 ENCOUNTER — Encounter: Payer: Self-pay | Admitting: Physician Assistant

## 2013-12-19 ENCOUNTER — Telehealth: Payer: Self-pay | Admitting: General Practice

## 2013-12-19 NOTE — Telephone Encounter (Signed)
Patient called and left message in spanish stating she needs a prescription for acid. Called patient back with pac[employer] interpreter [phone], no answer- left message stating we are trying to return your phone call, please call us back at the clinics

## 2013-12-20 NOTE — Telephone Encounter (Signed)
Called patient with pacific interpreter [phone], no answer- left message stating we are trying to return your phone call, please call us back at the clinics

## 2013-12-25 ENCOUNTER — Ambulatory Visit (INDEPENDENT_AMBULATORY_CARE_PROVIDER_SITE_OTHER): Payer: BC Managed Care – PPO | Admitting: Obstetrics and Gynecology

## 2013-12-25 ENCOUNTER — Encounter: Payer: Self-pay | Admitting: Obstetrics and Gynecology

## 2013-12-25 ENCOUNTER — Other Ambulatory Visit: Payer: Self-pay

## 2013-12-25 VITALS — BP 102/66 | HR 106 | Temp 98.6°F | Wt 195.3 lb

## 2013-12-25 DIAGNOSIS — O99810 Abnormal glucose complicating pregnancy: Secondary | ICD-10-CM

## 2013-12-25 DIAGNOSIS — R12 Heartburn: Secondary | ICD-10-CM

## 2013-12-25 DIAGNOSIS — O26892 Other specified pregnancy related conditions, second trimester: Secondary | ICD-10-CM

## 2013-12-25 DIAGNOSIS — O99212 Obesity complicating pregnancy, second trimester: Secondary | ICD-10-CM

## 2013-12-25 DIAGNOSIS — R7309 Other abnormal glucose: Secondary | ICD-10-CM

## 2013-12-25 DIAGNOSIS — E669 Obesity, unspecified: Secondary | ICD-10-CM

## 2013-12-25 DIAGNOSIS — Z3492 Encounter for supervision of normal pregnancy, unspecified, second trimester: Secondary | ICD-10-CM

## 2013-12-25 DIAGNOSIS — Z23 Encounter for immunization: Secondary | ICD-10-CM

## 2013-12-25 LAB — POCT URINALYSIS DIP (DEVICE)
BILIRUBIN URINE: NEGATIVE
GLUCOSE, UA: NEGATIVE mg/dL
Hgb urine dipstick: NEGATIVE
Ketones, ur: NEGATIVE mg/dL
Leukocytes, UA: NEGATIVE
NITRITE: NEGATIVE
Protein, ur: NEGATIVE mg/dL
Urobilinogen, UA: 0.2 mg/dL (ref 0.0–1.0)
pH: 6 (ref 5.0–8.0)

## 2013-12-25 MED ORDER — TETANUS-DIPHTH-ACELL PERTUSSIS 5-2.5-18.5 LF-MCG/0.5 IM SUSP: 0.5000 mL | Freq: Once | INTRAMUSCULAR | Status: DC

## 2013-12-25 MED ORDER — PANTOPRAZOLE SODIUM 40 MG PO TBEC: 40.0000 mg | DELAYED_RELEASE_TABLET | Freq: Every day | ORAL | Status: DC

## 2013-12-25 NOTE — Progress Notes (Signed)
Pt needs 3 hour glucose test on Wed.

## 2013-12-25 NOTE — Progress Notes (Signed)
3 hr OGTT scheduled. Hx of A1 GDM with last. Last month's US normal but F/U scan indicated 6 wks from last due to elevated inhibin.  Heartburn, Tums ineffective> Rx Protonix

## 2013-12-25 NOTE — Patient Instructions (Addendum)
Segundo trimestre de Psychiatrist (Second Trimester of Pregnancy) El segundo trimestre va desde la semana13 hasta la 28, desde el cuarto hasta el sexto mes, y suele ser el momento en el que mejor se siente. Su organismo se ha adaptado a Charity fundraiser y comienza a Diplomatic Services operational officer. En general, las nuseas matutinas han disminuido o han desaparecido completamente, p El segundo trimestre es tambin la poca en la que el feto se desarrolla rpidamente. Hacia el final del sexto mes, el feto mide aproximadamente 9pulgadas (23cm) y pesa alrededor de 1 libras (700g). Es probable que sienta que el beb se Teacher, English as a foreign language (da pataditas) entre las 18 y 20semanas del Psychiatrist. CAMBIOS EN EL ORGANISMO Su organismo atraviesa por muchos cambios durante el [place], y estos varan de Neomia Dear mujer a Educational psychologist.  Seguir [employer]. Notar que la parte baja del abdomen sobresale. Podrn aparecer las primeras Albertson's caderas, el abdomen y las [place]. Es posible que tenga dolores de cabeza que pueden aliviarse con los medicamentos que su mdico autorice. Tal vez tenga necesidad de orinar con ms frecuencia porque el feto est ejerciendo presin Ambulance person. Debido al Vanetta Mulders podr sentir Anthoney Harada estomacal con frecuencia. Puede estar estreida, ya que ciertas hormonas enlentecen los movimientos de los msculos que [employer] desechos a travs de los intestinos. Pueden aparecer hemorroides o abultarse e hincharse las venas (venas varicosas). Puede tener dolor de espalda que se debe al Citigroup de peso y a que las hormonas del Management consultant las articulaciones entre los huesos de la pelvis, y Public librarian consecuencia de la modificacin del peso y los msculos que mantienen el equilibrio. Las ConAgra Foods seguirn creciendo y Development worker, community. Las [name] y estar sensibles al cepillado y al hilo dental. Pueden aparecer zonas oscuras o manchas (cloasma, mscara del Psychiatrist) en el rostro que probablemente se  atenuarn despus del nacimiento del beb. Es posible que se forme una lnea oscura desde el ombligo hasta la zona del pubis (linea nigra) que probablemente se atenuarn despus del nacimiento del beb. Tal vez haya cambios en el cabello que pueden incluir su engrosamiento, crecimiento rpido y cambios en la textura. Adems, a algunas mujeres se les cae el cabello durante o despus del embarazo, o tienen el cabello seco o fino. Lo ms probable es que el cabello se le normalice despus del nacimiento del beb. QU DEBE ESPERAR EN LAS CONSULTAS PRENATALES Durante una visita prenatal de rutina: La pesarn para asegurarse de que usted y el feto estn creciendo normalmente. Le tomarn la presin arterial. Le medirn el abdomen para controlar el desarrollo del beb. Se escucharn los latidos cardacos fetales. Se evaluarn los resultados de los estudios solicitados en visitas anteriores. El mdico puede preguntarle lo siguiente: Cmo se siente. Si siente los movimientos del beb. Si ha tenido sntomas anormales, como prdida de lquido, [place], dolores de cabeza intensos o clicos abdominales. Si tiene [employer]. Otros estudios que podrn realizarse durante el segundo trimestre incluyen lo siguiente: Anlisis de sangre para detectar: Concentraciones de hierro bajas (anemia). Diabetes gestacional (entre la semana 24 y la 28). Anticuerpos Rh. Anlisis de orina para detectar infecciones, diabetes o protenas en la orina. Una ecografa para confirmar que el beb crece y se desarrolla correctamente. Una amniocentesis para diagnosticar posibles problemas genticos. Estudios del feto para descartar espina bfida y sndrome de Down. INSTRUCCIONES PARA EL CUIDADO EN EL HOGAR  Evite fumar, consumir hierbas, beber alcohol y tomar frmacos que no le hayan recetado. Estas sustancias qumicas  afectan la formacin y el desarrollo del beb. Siga las indicaciones del mdico en relacin con el uso de  medicamentos. Durante el embarazo, hay medicamentos que son seguros de tomar y otros que no. Haga actividad fsica solo en la forma indicada por el mdico. Sentir clicos uterinos es un buen signo para Restaurant manager, fast fooddetener la actividad fsica. Contine comiendo alimentos que sanos con regularidad. Use un sostn que le brinde buen soporte si le Altria Groupduelen las mamas. No se d baos de inmersin en agua caliente, baos turcos ni saunas. Colquese el cinturn de seguridad cuando conduzca. No coma carne cruda ni queso sin cocinar; evite el contacto con las bandejas sanitarias de los gatos y la tierra que estos animales usan. Estos elementos contienen grmenes que pueden causar defectos congnitos en el beb. Tome las vitaminas prenatales. Si est estreida, pruebe un laxante suave (si el mdico lo autoriza). Consuma ms alimentos ricos en fibra, como vegetales y frutas frescos y Radiation protection practitionercereales integrales. Beba gran cantidad de lquido para mantener la orina de tono claro o color amarillo plido. Dese baos de asiento con agua tibia para Engineer, materialsaliviar el dolor o las molestias causadas por las hemorroides. Use una crema para las hemorroides si el mdico la autoriza. Si tiene venas varicosas, use medias de descanso. Eleve los pies durante 15minutos, 3 o 4veces por da. Limite la cantidad de sal en su dieta. No levante objetos pesados, use zapatos de tacones bajos y [address] Boulevardmantenga una buena postura. Descanse con las piernas elevadas si tiene calambres o dolor de cintura. Visite a su dentista si an no lo ha Occupational hygienisthecho durante el embarazo. Use un cepillo de dientes blando para higienizarse los dientes y psese el hilo dental con suavidad. Puede seguir Calpine Corporationmanteniendo relaciones sexuales, a menos que el mdico le indique lo contrario. Concurra a todas las visitas prenatales segn las indicaciones de su mdico. SOLICITE ATENCIN MDICA SI:  Santa Generaiene mareos. Siente clicos leves, presin en la pelvis o dolor persistente en el abdomen. Tiene nuseas,  vmitos o diarrea persistentes. Tiene secrecin vaginal con mal olor. Siente dolor al ConocoPhillipsorinar. SOLICITE ATENCIN MDICA DE INMEDIATO SI:  Tiene fiebre. Tiene una prdida de lquido por la vagina. Tiene sangrado o pequeas prdidas vaginales. Siente dolor intenso o clicos en el abdomen. Sube o baja de peso rpidamente. Tiene dificultad para respirar y siente dolor de pecho. Sbitamente se le hinchan mucho el rostro, las Edgarmanos, los tobillos, los pies o las piernas. No ha sentido los movimientos del beb durante Georgianne Fickuna hora. Siente un dolor de cabeza intenso que no se alivia con medicamentos. Hay cambios en la visin. Document Released: 12/17/2004 Document Revised: 03/14/2013 [facility] Patient Information 2015 [place]Care, [place]. This information is not intended to replace advice given to you by your health care provider. Make sure you discuss any questions you have with your health care provider. Acidez de estmago durante el embarazo  (Heartburn During Pregnancy ) La acidez es la sensacin de ardor en el pecho que se siente cuando el cido del estmago vuelve haca el esfago. La acidez es frecuente en el embarazo debido a la liberacin de cierta hormona (progesterona). La progesterona relaja la vlvula que separa el esfago del Gordonestmago. Esto hace que el cido suba al esfago y cause acidez. Tambin puede ocurrir Visual merchandiseren el embarazo debido a que el tero al agrandarse empuja el estmago, lo que hace que suba ms cido al esfago. Esto se produce especialmente en las ltimas etapas del embarazo. La acidez generalmente desaparece despus del parto. CAUSAS  La  acidez se siente cuando el cido del estmago vuelve hacia el esfago. Durante el [place], puede ser causada por distintas cosas, por ejemplo:  La hormona progesterona. Cambios en los niveles hormonales. El tero crece y [address] cido del estmago [place]. Comidas abundantes Ciertos alimentos y [address] fsica. Aumento en la  produccin de cido SIGNOS Y SNTOMAS  Sensacin de ardor en el pecho o en la parte inferior de la garganta. Sabor amargo en la boca. Tos. DIAGNSTICO  El mdico diagnostica la Anthoney Harada con una historia clnica cuidadosa en la que pregunta por sus molestias. Le indicar anlisis de sangre para Clinical research associate cierto tipo de bacteria que se asocia con la [place]. En algunos casos se diagnostica recetando un medicamento para calmar la acidez y viendo si los sntomas mejoran. En algunos casos, se realiza un procedimiento llamado endoscopa. En este procedimiento se [place] un tubo con Neomia Dear luz y Posey Boyer en un extremo (endoscopio) , y se examina el esfago y Investment banker, corporate. TRATAMIENTO  El tratamiento variar segn la gravedad de los sntomas. El mdico podr indicar: Medicamentos de Sales promotion account executive (anticidos, medicamentos para Conservator, museum/gallery Engineering geologist) en los casos de acidez leve. Medicamentos recetados para disminuir el cido estomacal o para proteger la superficie del [place]. Ciertos cambios en la dieta. Elevacin de la cabecera de la cama colocando bloques debajo de las patas. De esta manera evitar que el cido del estmago vuelva al esfago mientras est recostado. INSTRUCCIONES PARA EL CUIDADO EN EL HOGAR  Tome slo medicamentos de venta libre o recetados, segn las indicaciones del mdico. Eleve la cabecera de la cama colocando bloques debajo de las patas, si el mdico lo aconsej. Usar ms almohadas al dormir no es Secretary/administrator porque solo modificara la posicin de la cabeza. No  haga ejercicios enseguida despus de comer. Evite comer 2 o 3horas antes de irse a dormir. No se acueste enseguida despus de comer. Haga comidas pequeas Freight forwarder de tres comidas abundantes. Identifique los alimentos o las bebidas que empeoran sus sntomas y evtelos. Los alimentos que debe evitar son: [place]. Chocolate. Alimentos con alto contenido de grasas, incluyendo las comidas fritas Comidas muy  condimentadas. Ajo y [address], como naranja, pomelo, limn y lima Alimentos o productos que contengan tomate Menta. Bebidas gaseosas y con cafena. [place] SOLICITE ATENCIN MDICA SI: Tiene cualquier tipo de dolor abdominal. Siente ardor en la parte superior del abdomen o el pecho, especialmente despus de comer o mientras est acostada. Tiene nuseas o vmitos. Siente malestar estomacal despus de comer. SOLICITE ATENCIN MDICA DE INMEDIATO SI:  Siente un dolor intenso en el pecho que baja por el brazo o va hacia al mandbula o el cuello. Se siente mareado o sufre un desmayo. Comienza a sentir falta de aire. Vomita sangre. Tiene dificultad o dolor al tragar. La materia fecal es negra, de aspecto alquitranado. Tiene acidez ms de 3 veces por semana, durante ms de 2 semanas. ASEGRESE DE QUE: Comprende estas instrucciones. Controlar su afeccin. Recibir ayuda de inmediato si no mejora o si empeora. Document Released: 12/17/2004 Document Revised: 12/28/2012 [facility] Patient Information 2015 [place], [name]. This information is not intended to replace advice given to you by your health care provider. Make sure you discuss any questions you have with your health care provider.

## 2013-12-25 NOTE — Telephone Encounter (Signed)
Patient has appointment today note made to ask her what refill she needs.

## 2013-12-27 ENCOUNTER — Other Ambulatory Visit: Payer: Self-pay

## 2013-12-27 ENCOUNTER — Telehealth: Payer: Self-pay | Admitting: Obstetrics & Gynecology

## 2013-12-27 NOTE — Telephone Encounter (Signed)
Called left patient message, regarding missed 3 hour gtt appointment and informed her she needed to call back to reschedule missed appointment.

## 2014-01-15 ENCOUNTER — Ambulatory Visit (INDEPENDENT_AMBULATORY_CARE_PROVIDER_SITE_OTHER): Payer: Self-pay | Admitting: Family

## 2014-01-15 VITALS — BP 110/61 | HR 90 | Temp 98.0°F | Wt 198.1 lb

## 2014-01-15 DIAGNOSIS — Z23 Encounter for immunization: Secondary | ICD-10-CM

## 2014-01-15 DIAGNOSIS — Z3493 Encounter for supervision of normal pregnancy, unspecified, third trimester: Secondary | ICD-10-CM

## 2014-01-15 LAB — POCT URINALYSIS DIP (DEVICE)
Bilirubin Urine: NEGATIVE
GLUCOSE, UA: NEGATIVE mg/dL
HGB URINE DIPSTICK: NEGATIVE
Leukocytes, UA: NEGATIVE
NITRITE: NEGATIVE
Protein, ur: NEGATIVE mg/dL
SPECIFIC GRAVITY, URINE: 1.015 (ref 1.005–1.030)
UROBILINOGEN UA: 0.2 mg/dL (ref 0.0–1.0)
pH: 7 (ref 5.0–8.0)

## 2014-01-15 MED ORDER — TETANUS-DIPHTH-ACELL PERTUSSIS 5-2.5-18.5 LF-MCG/0.5 IM SUSP: 0.5000 mL | Freq: Once | INTRAMUSCULAR | Status: DC

## 2014-01-15 NOTE — Progress Notes (Signed)
Pt had an elevated 1 hr and one high abnormal value on 3 hr test early in pregnancy.  It was recommended that pt had repeat 3 hr in third trimester.  Pt refuses to do 3 hr test.  Pt agrees to diabetic teaching and checking BS QID.  Plans to return on Monday.  Transfer to HR clinic.  Tdap and flu vaccine today.  Pt upset about getting multiple ultrasound, explained why she was getting them (poor views and elevated inhibin).  Pt agrees to do the ultrasound on 10/30 but refuses any additional.

## 2014-01-15 NOTE — Progress Notes (Signed)
Interpreter Mattie MarlinSilvia Sobalvarro used as interpreter for this encounter.  Patient missed 3hr gtt appointment--- states "I don' want to take that test again." Per chart review-- failed early 1hr gtt and 3hr gtt in July-- never went to diabetic education.  Tdap and flu vaccine today.

## 2014-01-19 ENCOUNTER — Ambulatory Visit (HOSPITAL_COMMUNITY): Payer: BC Managed Care – PPO

## 2014-01-22 ENCOUNTER — Other Ambulatory Visit (HOSPITAL_COMMUNITY): Payer: Self-pay | Admitting: Maternal and Fetal Medicine

## 2014-01-22 ENCOUNTER — Ambulatory Visit (HOSPITAL_COMMUNITY)
Admission: RE | Admit: 2014-01-22 | Discharge: 2014-01-22 | Disposition: A | Discharge status: Home / Self Care | Payer: BC Managed Care – PPO | Source: Ambulatory Visit | Attending: Physician Assistant | Admitting: Physician Assistant

## 2014-01-22 ENCOUNTER — Encounter: Payer: Self-pay | Admitting: Obstetrics and Gynecology

## 2014-01-22 ENCOUNTER — Ambulatory Visit: Payer: Self-pay

## 2014-01-22 DIAGNOSIS — O289 Unspecified abnormal findings on antenatal screening of mother: Secondary | ICD-10-CM

## 2014-01-22 DIAGNOSIS — E669 Obesity, unspecified: Secondary | ICD-10-CM

## 2014-01-22 DIAGNOSIS — Z3A30 30 weeks gestation of pregnancy: Secondary | ICD-10-CM | POA: Diagnosis not present

## 2014-01-22 DIAGNOSIS — Z36 Encounter for antenatal screening of mother: Secondary | ICD-10-CM | POA: Insufficient documentation

## 2014-01-22 DIAGNOSIS — O2622 Pregnancy care for patient with recurrent pregnancy loss, second trimester: Secondary | ICD-10-CM

## 2014-01-22 DIAGNOSIS — O09293 Supervision of pregnancy with other poor reproductive or obstetric history, third trimester: Secondary | ICD-10-CM

## 2014-01-22 DIAGNOSIS — O2623 Pregnancy care for patient with recurrent pregnancy loss, third trimester: Secondary | ICD-10-CM

## 2014-01-22 DIAGNOSIS — O99210 Obesity complicating pregnancy, unspecified trimester: Secondary | ICD-10-CM

## 2014-01-22 DIAGNOSIS — O99213 Obesity complicating pregnancy, third trimester: Secondary | ICD-10-CM

## 2014-01-22 DIAGNOSIS — Z8632 Personal history of gestational diabetes: Secondary | ICD-10-CM

## 2014-01-22 DIAGNOSIS — Z3689 Encounter for other specified antenatal screening: Secondary | ICD-10-CM

## 2014-01-29 ENCOUNTER — Encounter: Payer: Self-pay | Admitting: Obstetrics & Gynecology

## 2014-01-29 ENCOUNTER — Encounter: Payer: BC Managed Care – PPO | Attending: Obstetrics and Gynecology | Admitting: *Deleted

## 2014-01-29 ENCOUNTER — Ambulatory Visit (INDEPENDENT_AMBULATORY_CARE_PROVIDER_SITE_OTHER): Payer: BC Managed Care – PPO | Admitting: Obstetrics and Gynecology

## 2014-01-29 DIAGNOSIS — E669 Obesity, unspecified: Secondary | ICD-10-CM | POA: Diagnosis not present

## 2014-01-29 DIAGNOSIS — O99212 Obesity complicating pregnancy, second trimester: Secondary | ICD-10-CM | POA: Diagnosis not present

## 2014-01-29 DIAGNOSIS — Z713 Dietary counseling and surveillance: Secondary | ICD-10-CM | POA: Insufficient documentation

## 2014-01-29 DIAGNOSIS — Z6838 Body mass index (BMI) 38.0-38.9, adult: Secondary | ICD-10-CM | POA: Insufficient documentation

## 2014-01-29 LAB — POCT URINALYSIS DIP (DEVICE)
BILIRUBIN URINE: NEGATIVE
Glucose, UA: NEGATIVE mg/dL
Hgb urine dipstick: NEGATIVE
Ketones, ur: NEGATIVE mg/dL
NITRITE: NEGATIVE
PH: 7 (ref 5.0–8.0)
Protein, ur: NEGATIVE mg/dL
Specific Gravity, Urine: 1.01 (ref 1.005–1.030)
UROBILINOGEN UA: 0.2 mg/dL (ref 0.0–1.0)

## 2014-01-29 LAB — CBC
HCT: 33.7 % — ABNORMAL LOW (ref 36.0–46.0)
Hemoglobin: 11.7 g/dL — ABNORMAL LOW (ref 12.0–15.0)
MCH: 30.5 pg (ref 26.0–34.0)
MCHC: 34.7 g/dL (ref 30.0–36.0)
MCV: 88 fL (ref 78.0–100.0)
PLATELETS: 161 10*3/uL (ref 150–400)
RBC: 3.83 MIL/uL — ABNORMAL LOW (ref 3.87–5.11)
RDW: 15 % (ref 11.5–15.5)
WBC: 6.1 10*3/uL (ref 4.0–10.5)

## 2014-01-29 NOTE — Progress Notes (Signed)
Instructed patient of use of One touch Ultra Mini (Covered by Nucor CorporationBCBS Advantage) Provided Return Demonstration. Reviewed nutrition guidelines. Carb counting card provided. Orders for meter and all testing supplies Disp:#100, Refill [facility] to [facility] [address]  [phone].

## 2014-01-29 NOTE — Progress Notes (Signed)
Only complaint today is pain w/ FM and feeling tired.  + FM, no VB, no LOF. No vaginal discharge, no contractions.  #) Elevated 1 hr GTT w/ 1/4 elevated on 3 hr GTT - Pt had an elevated 1 hr and one high abnormal value on 3 hr test early in pregnancy. It was recommended that pt had repeat 3 hr in third trimester. Pt refuses to do 3 hr test. Pt agrees to diabetic teaching and checking BS QID.  #) Elevated inhibin and abnormal quad (1:124 DSW) --> f/up US at 30 wk WNL, but high-normal AFI (25) and breech -- needs f/up at 36-37 wks

## 2014-01-29 NOTE — Progress Notes (Signed)
C/o of intermittent pelvic pressure.  States she refuses 3hr gtt--- bu to have 28 week labs today.

## 2014-01-30 LAB — HIV ANTIBODY (ROUTINE TESTING W REFLEX): HIV 1&2 Ab, 4th Generation: NONREACTIVE

## 2014-01-30 LAB — RPR

## 2014-02-05 ENCOUNTER — Ambulatory Visit (INDEPENDENT_AMBULATORY_CARE_PROVIDER_SITE_OTHER): Payer: BC Managed Care – PPO | Admitting: Family Medicine

## 2014-02-05 VITALS — BP 110/64 | HR 98 | Temp 98.3°F | Wt 196.7 lb

## 2014-02-05 DIAGNOSIS — Z3493 Encounter for supervision of normal pregnancy, unspecified, third trimester: Secondary | ICD-10-CM

## 2014-02-05 LAB — POCT URINALYSIS DIP (DEVICE)
Bilirubin Urine: NEGATIVE
Glucose, UA: NEGATIVE mg/dL
Hgb urine dipstick: NEGATIVE
Ketones, ur: NEGATIVE mg/dL
LEUKOCYTES UA: NEGATIVE
Nitrite: NEGATIVE
Protein, ur: NEGATIVE mg/dL
Specific Gravity, Urine: 1.02 (ref 1.005–1.030)
Urobilinogen, UA: 0.2 mg/dL (ref 0.0–1.0)
pH: 7 (ref 5.0–8.0)

## 2014-02-05 NOTE — Progress Notes (Signed)
Okey RegalCarol present for interpreter

## 2014-02-05 NOTE — Progress Notes (Signed)
Only complaint today is pain w/ FM and feeling tired.  + FM, no VB, no LOF. No vaginal discharge, no contractions.  #) Elevated 1 hr GTT w/ 1/4 elevated on 3 hr GTT - refused repeat 3 hr GTT - Brings in sugar log today and as follows: Fasting: 82 -101 (2/7 above goal) 2 hr pp: 83 -161, 178 (2 of 17 above goal) Commended on dietary changes and good sugar control.   #) Breech with high/normal AFI -  - US scheduled for Dec 9th - appears breech on [place] and by doppler today, gave info on version if she decides on this  # desires BTL - sent to counselor today  + FM, no VB, no LOF. No contractions.

## 2014-02-12 ENCOUNTER — Ambulatory Visit (INDEPENDENT_AMBULATORY_CARE_PROVIDER_SITE_OTHER): Payer: BC Managed Care – PPO | Admitting: Obstetrics and Gynecology

## 2014-02-12 ENCOUNTER — Encounter: Payer: Self-pay | Admitting: Obstetrics and Gynecology

## 2014-02-12 VITALS — BP 122/77 | HR 101 | Temp 98.1°F | Wt 195.1 lb

## 2014-02-12 DIAGNOSIS — Z3493 Encounter for supervision of normal pregnancy, unspecified, third trimester: Secondary | ICD-10-CM

## 2014-02-12 DIAGNOSIS — E669 Obesity, unspecified: Secondary | ICD-10-CM

## 2014-02-12 DIAGNOSIS — O99212 Obesity complicating pregnancy, second trimester: Secondary | ICD-10-CM

## 2014-02-12 LAB — POCT URINALYSIS DIP (DEVICE)
Bilirubin Urine: NEGATIVE
GLUCOSE, UA: NEGATIVE mg/dL
Ketones, ur: 15 mg/dL — AB
Leukocytes, UA: NEGATIVE
Nitrite: NEGATIVE
Protein, ur: NEGATIVE mg/dL
Specific Gravity, Urine: 1.025 (ref 1.005–1.030)
UROBILINOGEN UA: 0.2 mg/dL (ref 0.0–1.0)
pH: 5.5 (ref 5.0–8.0)

## 2014-02-12 NOTE — Progress Notes (Signed)
Pt wants to know ultrasound results

## 2014-02-12 NOTE — Progress Notes (Signed)
Patient is doing well without complaints. CBGs reviewed and all within range, FM/PTL precautions reviewed. Infant still breech, briefly discussed ECV as patient is not interested in c-section.

## 2014-02-19 ENCOUNTER — Encounter: Payer: Self-pay | Admitting: Family Medicine

## 2014-02-24 ENCOUNTER — Other Ambulatory Visit: Payer: Self-pay | Admitting: Obstetrics and Gynecology

## 2014-02-26 ENCOUNTER — Ambulatory Visit (INDEPENDENT_AMBULATORY_CARE_PROVIDER_SITE_OTHER): Payer: Self-pay | Admitting: Obstetrics and Gynecology

## 2014-02-26 VITALS — BP 110/66 | HR 100 | Temp 97.5°F | Wt 197.3 lb

## 2014-02-26 DIAGNOSIS — Z3493 Encounter for supervision of normal pregnancy, unspecified, third trimester: Secondary | ICD-10-CM

## 2014-02-26 LAB — POCT URINALYSIS DIP (DEVICE)
BILIRUBIN URINE: NEGATIVE
GLUCOSE, UA: NEGATIVE mg/dL
Hgb urine dipstick: NEGATIVE
Ketones, ur: NEGATIVE mg/dL
NITRITE: NEGATIVE
PH: 7 (ref 5.0–8.0)
Protein, ur: NEGATIVE mg/dL
Specific Gravity, Urine: 1.025 (ref 1.005–1.030)
UROBILINOGEN UA: 0.2 mg/dL (ref 0.0–1.0)

## 2014-02-26 NOTE — Progress Notes (Signed)
C/o Pressure in groin area and contractions that come and go  Pt would like to know if the baby has turned from the breech position. Interpreter used: Amber Branch

## 2014-02-26 NOTE — Progress Notes (Signed)
Doing well today. Feeling some pressure.  1. Routine PNC. 36 week labs next week. FM/PTL precautions reviewed.  2. Breech position. Ultrasound scheduled for Wednesday to determine position.  3. Elevated blood glucose values. Has been checking blood sugars as she declined 3 hour testing. Blood sugars have been mostly elevated > 90 in the morning and a few have been elevated > 120 postpradial. Patient will not start medications today. Declines nutrition today as she has another appointment. Agrees to see nutrition/diabetes educator next week.

## 2014-02-28 ENCOUNTER — Encounter (HOSPITAL_COMMUNITY): Payer: Self-pay

## 2014-02-28 ENCOUNTER — Ambulatory Visit (HOSPITAL_COMMUNITY)
Admission: RE | Admit: 2014-02-28 | Discharge: 2014-02-28 | Disposition: A | Discharge status: Home / Self Care | Payer: BC Managed Care – PPO | Source: Ambulatory Visit | Attending: Family Medicine | Admitting: Family Medicine

## 2014-02-28 DIAGNOSIS — Z8632 Personal history of gestational diabetes: Secondary | ICD-10-CM

## 2014-02-28 DIAGNOSIS — O99810 Abnormal glucose complicating pregnancy: Secondary | ICD-10-CM | POA: Insufficient documentation

## 2014-02-28 DIAGNOSIS — O24410 Gestational diabetes mellitus in pregnancy, diet controlled: Secondary | ICD-10-CM | POA: Diagnosis present

## 2014-02-28 DIAGNOSIS — O2623 Pregnancy care for patient with recurrent pregnancy loss, third trimester: Secondary | ICD-10-CM | POA: Diagnosis not present

## 2014-02-28 DIAGNOSIS — Z3A35 35 weeks gestation of pregnancy: Secondary | ICD-10-CM | POA: Insufficient documentation

## 2014-02-28 DIAGNOSIS — O99213 Obesity complicating pregnancy, third trimester: Secondary | ICD-10-CM | POA: Diagnosis not present

## 2014-02-28 DIAGNOSIS — O3660X0 Maternal care for excessive fetal growth, unspecified trimester, not applicable or unspecified: Secondary | ICD-10-CM | POA: Insufficient documentation

## 2014-02-28 DIAGNOSIS — O289 Unspecified abnormal findings on antenatal screening of mother: Secondary | ICD-10-CM

## 2014-02-28 DIAGNOSIS — Z3689 Encounter for other specified antenatal screening: Secondary | ICD-10-CM

## 2014-02-28 DIAGNOSIS — O09293 Supervision of pregnancy with other poor reproductive or obstetric history, third trimester: Secondary | ICD-10-CM

## 2014-03-05 ENCOUNTER — Encounter: Payer: Self-pay | Admitting: *Deleted

## 2014-03-05 ENCOUNTER — Ambulatory Visit (INDEPENDENT_AMBULATORY_CARE_PROVIDER_SITE_OTHER): Payer: BC Managed Care – PPO | Admitting: Obstetrics & Gynecology

## 2014-03-05 VITALS — BP 108/65 | HR 99 | Temp 98.2°F | Wt 196.1 lb

## 2014-03-05 DIAGNOSIS — O99810 Abnormal glucose complicating pregnancy: Secondary | ICD-10-CM

## 2014-03-05 LAB — POCT URINALYSIS DIP (DEVICE)
Glucose, UA: NEGATIVE mg/dL
HGB URINE DIPSTICK: NEGATIVE
Ketones, ur: 15 mg/dL — AB
NITRITE: NEGATIVE
PROTEIN: NEGATIVE mg/dL
SPECIFIC GRAVITY, URINE: 1.025 (ref 1.005–1.030)
UROBILINOGEN UA: 1 mg/dL (ref 0.0–1.0)
pH: 6.5 (ref 5.0–8.0)

## 2014-03-05 LAB — OB RESULTS CONSOLE GC/CHLAMYDIA
Chlamydia: NEGATIVE
GC PROBE AMP, GENITAL: NEGATIVE

## 2014-03-05 NOTE — Progress Notes (Signed)
Patient is Spanish-speaking only, Spanish interpreter present for this encounter. Blood sugars are within range. 12/9 EFW 3377g (7-7)/>09%, AC >97%, cephalic presentation, normal AFV Reports pelvic pressure and irregular contractions, cervix 3.5/70/-2/vertex. Pelvic cultures done today. Labor and fetal movement precautions reviewed.

## 2014-03-05 NOTE — Patient Instructions (Signed)
Regrese a la clinica cuando tenga su cita. Si tiene problemas o preguntas, llama a la clinica o vaya a la sala de emergencia al [facility].    

## 2014-03-06 LAB — GC/CHLAMYDIA PROBE AMP
CT Probe RNA: NEGATIVE
GC PROBE AMP APTIMA: NEGATIVE

## 2014-03-07 LAB — CULTURE, BETA STREP (GROUP B ONLY)

## 2014-03-12 ENCOUNTER — Ambulatory Visit (INDEPENDENT_AMBULATORY_CARE_PROVIDER_SITE_OTHER): Payer: BC Managed Care – PPO | Admitting: Obstetrics and Gynecology

## 2014-03-12 ENCOUNTER — Encounter: Payer: Self-pay | Admitting: Obstetrics and Gynecology

## 2014-03-12 VITALS — BP 113/75 | HR 72 | Wt 199.2 lb

## 2014-03-12 DIAGNOSIS — O0993 Supervision of high risk pregnancy, unspecified, third trimester: Secondary | ICD-10-CM

## 2014-03-12 DIAGNOSIS — O99213 Obesity complicating pregnancy, third trimester: Secondary | ICD-10-CM

## 2014-03-12 DIAGNOSIS — O3663X1 Maternal care for excessive fetal growth, third trimester, fetus 1: Secondary | ICD-10-CM

## 2014-03-12 LAB — POCT URINALYSIS DIP (DEVICE)
Bilirubin Urine: NEGATIVE
Glucose, UA: NEGATIVE mg/dL
Ketones, ur: NEGATIVE mg/dL
NITRITE: NEGATIVE
PROTEIN: NEGATIVE mg/dL
SPECIFIC GRAVITY, URINE: 1.02 (ref 1.005–1.030)
Urobilinogen, UA: 0.2 mg/dL (ref 0.0–1.0)
pH: 6.5 (ref 5.0–8.0)

## 2014-03-12 NOTE — Progress Notes (Signed)
Pt reports pelvic pain and some contractions.  Spanish interpreter Marlynn PerkingMaria Elena  used to help with checkin.

## 2014-03-12 NOTE — Progress Notes (Signed)
Patient is doing well without complaints. FM/labor precautions reviewed. CBGs all within range

## 2014-03-19 ENCOUNTER — Encounter: Payer: Self-pay | Admitting: Family Medicine

## 2014-03-19 ENCOUNTER — Inpatient Hospital Stay (HOSPITAL_COMMUNITY): Payer: BC Managed Care – PPO | Admitting: Anesthesiology

## 2014-03-19 ENCOUNTER — Encounter (HOSPITAL_COMMUNITY): Payer: Self-pay | Admitting: *Deleted

## 2014-03-19 ENCOUNTER — Encounter (HOSPITAL_COMMUNITY)
Admission: AD | Disposition: A | Discharge status: Home / Self Care | Payer: Self-pay | Source: Ambulatory Visit | Attending: Obstetrics and Gynecology

## 2014-03-19 ENCOUNTER — Inpatient Hospital Stay (HOSPITAL_COMMUNITY)
Admission: AD | Admit: 2014-03-19 | Discharge: 2014-03-20 | DRG: 767 | Disposition: A | Discharge status: Home / Self Care | Payer: BC Managed Care – PPO | Source: Ambulatory Visit | Attending: Obstetrics and Gynecology | Admitting: Obstetrics and Gynecology

## 2014-03-19 DIAGNOSIS — Z3A38 38 weeks gestation of pregnancy: Secondary | ICD-10-CM | POA: Diagnosis present

## 2014-03-19 DIAGNOSIS — Z302 Encounter for sterilization: Secondary | ICD-10-CM

## 2014-03-19 DIAGNOSIS — Z3483 Encounter for supervision of other normal pregnancy, third trimester: Secondary | ICD-10-CM | POA: Diagnosis present

## 2014-03-19 HISTORY — PX: TUBAL LIGATION: SHX77

## 2014-03-19 LAB — CBC
HCT: 36.7 % (ref 36.0–46.0)
Hemoglobin: 12.1 g/dL (ref 12.0–15.0)
MCH: 30.5 pg (ref 26.0–34.0)
MCHC: 33 g/dL (ref 30.0–36.0)
MCV: 92.4 fL (ref 78.0–100.0)
Platelets: 120 10*3/uL — ABNORMAL LOW (ref 150–400)
RBC: 3.97 MIL/uL (ref 3.87–5.11)
RDW: 15 % (ref 11.5–15.5)
WBC: 10.6 10*3/uL — ABNORMAL HIGH (ref 4.0–10.5)

## 2014-03-19 LAB — TYPE AND SCREEN
ABO/RH(D): O POS
Antibody Screen: NEGATIVE

## 2014-03-19 LAB — SYPHILIS: RPR W/REFLEX TO RPR TITER AND TREPONEMAL ANTIBODIES, TRADITIONAL SCREENING AND DIAGNOSIS ALGORITHM

## 2014-03-19 LAB — MRSA PCR SCREENING: MRSA by PCR: NEGATIVE

## 2014-03-19 LAB — OB RESULTS CONSOLE GBS: GBS: NEGATIVE

## 2014-03-19 SURGERY — LIGATION, FALLOPIAN TUBE, POSTPARTUM
Anesthesia: Spinal | Site: Abdomen | Laterality: Bilateral

## 2014-03-19 MED ORDER — ONDANSETRON HCL 4 MG PO TABS: 4.0000 mg | ORAL_TABLET | ORAL | Status: DC | PRN

## 2014-03-19 MED ORDER — FENTANYL CITRATE 0.05 MG/ML IJ SOLN
INTRAMUSCULAR | Status: AC
  Filled 2014-03-19: qty 2

## 2014-03-19 MED ORDER — 0.9 % SODIUM CHLORIDE (POUR BTL) OPTIME
TOPICAL | Status: DC | PRN
  Administered 2014-03-19: 1000 mL

## 2014-03-19 MED ORDER — ACETAMINOPHEN 325 MG PO TABS: 650.0000 mg | ORAL_TABLET | ORAL | Status: DC | PRN

## 2014-03-19 MED ORDER — SIMETHICONE 80 MG PO CHEW: 80.0000 mg | CHEWABLE_TABLET | ORAL | Status: DC | PRN

## 2014-03-19 MED ORDER — FAMOTIDINE 20 MG PO TABS: 40.0000 mg | ORAL_TABLET | Freq: Once | ORAL | Status: DC

## 2014-03-19 MED ORDER — LANOLIN HYDROUS EX OINT: TOPICAL_OINTMENT | CUTANEOUS | Status: DC | PRN

## 2014-03-19 MED ORDER — LACTATED RINGERS IV SOLN: INTRAVENOUS | Status: DC

## 2014-03-19 MED ORDER — CITRIC ACID-SODIUM CITRATE 334-500 MG/5ML PO SOLN
ORAL | Status: AC
  Administered 2014-03-19: 30 mL via ORAL
  Filled 2014-03-19: qty 15

## 2014-03-19 MED ORDER — DIPHENHYDRAMINE HCL 25 MG PO CAPS: 25.0000 mg | ORAL_CAPSULE | Freq: Four times a day (QID) | ORAL | Status: DC | PRN

## 2014-03-19 MED ORDER — OXYCODONE-ACETAMINOPHEN 5-325 MG PO TABS
1.0000 | ORAL_TABLET | ORAL | Status: DC | PRN
  Filled 2014-03-19: qty 1

## 2014-03-19 MED ORDER — ONDANSETRON HCL 4 MG/2ML IJ SOLN
4.0000 mg | Freq: Four times a day (QID) | INTRAMUSCULAR | Status: DC | PRN
Start: 2014-03-19 — End: 2014-03-19

## 2014-03-19 MED ORDER — OXYCODONE-ACETAMINOPHEN 5-325 MG PO TABS: 2.0000 | ORAL_TABLET | ORAL | Status: DC | PRN

## 2014-03-19 MED ORDER — CITRIC ACID-SODIUM CITRATE 334-500 MG/5ML PO SOLN: 30.0000 mL | ORAL | Status: DC | PRN

## 2014-03-19 MED ORDER — LACTATED RINGERS IV SOLN
INTRAVENOUS | Status: DC | PRN
  Administered 2014-03-19: 16:00:00 via INTRAVENOUS

## 2014-03-19 MED ORDER — CITRIC ACID-SODIUM CITRATE 334-500 MG/5ML PO SOLN
30.0000 mL | Freq: Once | ORAL | Status: AC
  Administered 2014-03-19: 30 mL via ORAL

## 2014-03-19 MED ORDER — OXYTOCIN 10 UNIT/ML IJ SOLN
10.0000 [IU] | Freq: Once | INTRAMUSCULAR | Status: DC
Start: 2014-03-19 — End: 2014-03-19

## 2014-03-19 MED ORDER — OXYTOCIN 10 UNIT/ML IJ SOLN
10.0000 [IU] | Freq: Once | INTRAMUSCULAR | Status: AC
  Administered 2014-03-19: 10 [IU] via INTRAMUSCULAR

## 2014-03-19 MED ORDER — MORPHINE SULFATE 0.5 MG/ML IJ SOLN
INTRAMUSCULAR | Status: AC
  Filled 2014-03-19: qty 10

## 2014-03-19 MED ORDER — LANOLIN HYDROUS EX OINT
1.0000 | TOPICAL_OINTMENT | CUTANEOUS | Status: DC | PRN
Start: 2014-03-19 — End: 2014-03-19

## 2014-03-19 MED ORDER — LACTATED RINGERS IV SOLN: 500.0000 mL | INTRAVENOUS | Status: DC | PRN

## 2014-03-19 MED ORDER — SENNOSIDES-DOCUSATE SODIUM 8.6-50 MG PO TABS
2.0000 | ORAL_TABLET | ORAL | Status: DC
  Administered 2014-03-19: 2 via ORAL
  Filled 2014-03-19: qty 2

## 2014-03-19 MED ORDER — PHENYLEPHRINE 40 MCG/ML (10ML) SYRINGE FOR IV PUSH (FOR BLOOD PRESSURE SUPPORT)
PREFILLED_SYRINGE | INTRAVENOUS | Status: AC
  Filled 2014-03-19: qty 5

## 2014-03-19 MED ORDER — METOCLOPRAMIDE HCL 10 MG PO TABS: 10.0000 mg | ORAL_TABLET | Freq: Once | ORAL | Status: DC

## 2014-03-19 MED ORDER — IBUPROFEN 600 MG PO TABS
600.0000 mg | ORAL_TABLET | Freq: Four times a day (QID) | ORAL | Status: DC
  Administered 2014-03-19 – 2014-03-20 (×4): 600 mg via ORAL
  Filled 2014-03-19 (×4): qty 1

## 2014-03-19 MED ORDER — ZOLPIDEM TARTRATE 5 MG PO TABS: 5.0000 mg | ORAL_TABLET | Freq: Every evening | ORAL | Status: DC | PRN

## 2014-03-19 MED ORDER — PHENYLEPHRINE HCL 10 MG/ML IJ SOLN
INTRAMUSCULAR | Status: DC | PRN
  Administered 2014-03-19 (×3): 80 ug via INTRAVENOUS

## 2014-03-19 MED ORDER — BUPIVACAINE HCL (PF) 0.25 % IJ SOLN
INTRAMUSCULAR | Status: AC
  Filled 2014-03-19: qty 30

## 2014-03-19 MED ORDER — ONDANSETRON HCL 4 MG/2ML IJ SOLN
INTRAMUSCULAR | Status: AC
  Filled 2014-03-19: qty 2

## 2014-03-19 MED ORDER — PRENATAL MULTIVITAMIN CH
1.0000 | ORAL_TABLET | Freq: Every day | ORAL | Status: DC
  Administered 2014-03-20: 1 via ORAL
  Filled 2014-03-19: qty 1

## 2014-03-19 MED ORDER — MIDAZOLAM HCL 2 MG/2ML IJ SOLN
INTRAMUSCULAR | Status: AC
  Filled 2014-03-19: qty 2

## 2014-03-19 MED ORDER — ONDANSETRON HCL 4 MG/2ML IJ SOLN: 4.0000 mg | INTRAMUSCULAR | Status: DC | PRN

## 2014-03-19 MED ORDER — OXYTOCIN 10 UNIT/ML IJ SOLN
INTRAMUSCULAR | Status: AC
  Administered 2014-03-19: 10 [IU] via INTRAMUSCULAR
  Filled 2014-03-19: qty 1

## 2014-03-19 MED ORDER — OXYCODONE-ACETAMINOPHEN 5-325 MG PO TABS
1.0000 | ORAL_TABLET | ORAL | Status: DC | PRN
  Administered 2014-03-19: 1 via ORAL
  Filled 2014-03-19: qty 1

## 2014-03-19 MED ORDER — PROPOFOL 10 MG/ML IV BOLUS
INTRAVENOUS | Status: DC | PRN
  Administered 2014-03-19 (×4): 10 mg via INTRAVENOUS

## 2014-03-19 MED ORDER — BENZOCAINE-MENTHOL 20-0.5 % EX AERO
1.0000 "application " | INHALATION_SPRAY | CUTANEOUS | Status: DC | PRN
  Filled 2014-03-19: qty 56

## 2014-03-19 MED ORDER — LIDOCAINE HCL (PF) 1 % IJ SOLN: 30.0000 mL | INTRAMUSCULAR | Status: DC | PRN

## 2014-03-19 MED ORDER — FENTANYL CITRATE 0.05 MG/ML IJ SOLN
INTRAMUSCULAR | Status: DC | PRN
  Administered 2014-03-19 (×2): 50 ug via INTRAVENOUS

## 2014-03-19 MED ORDER — FENTANYL CITRATE 0.05 MG/ML IJ SOLN
25.0000 ug | INTRAMUSCULAR | Status: DC | PRN
  Administered 2014-03-19: 50 ug via INTRAVENOUS

## 2014-03-19 MED ORDER — PROPOFOL 10 MG/ML IV EMUL
INTRAVENOUS | Status: AC
  Filled 2014-03-19: qty 20

## 2014-03-19 MED ORDER — WITCH HAZEL-GLYCERIN EX PADS
1.0000 | MEDICATED_PAD | CUTANEOUS | Status: DC | PRN
Start: 2014-03-19 — End: 2014-03-20
  Administered 2014-03-19: 1 via TOPICAL

## 2014-03-19 MED ORDER — TETANUS-DIPHTH-ACELL PERTUSSIS 5-2.5-18.5 LF-MCG/0.5 IM SUSP: 0.5000 mL | Freq: Once | INTRAMUSCULAR | Status: DC

## 2014-03-19 MED ORDER — DIBUCAINE 1 % RE OINT
1.0000 "application " | TOPICAL_OINTMENT | RECTAL | Status: DC | PRN
  Filled 2014-03-19: qty 28

## 2014-03-19 MED ORDER — OXYCODONE-ACETAMINOPHEN 5-325 MG PO TABS
2.0000 | ORAL_TABLET | ORAL | Status: DC | PRN
  Administered 2014-03-19 – 2014-03-20 (×2): 2 via ORAL
  Filled 2014-03-19 (×2): qty 2

## 2014-03-19 MED ORDER — ONDANSETRON HCL 4 MG/2ML IJ SOLN
INTRAMUSCULAR | Status: DC | PRN
  Administered 2014-03-19: 4 mg via INTRAVENOUS

## 2014-03-19 MED ORDER — MIDAZOLAM HCL 5 MG/5ML IJ SOLN
INTRAMUSCULAR | Status: DC | PRN
  Administered 2014-03-19 (×2): 1 mg via INTRAVENOUS

## 2014-03-19 MED ORDER — BUPIVACAINE HCL (PF) 0.25 % IJ SOLN
INTRAMUSCULAR | Status: DC | PRN
  Administered 2014-03-19: 30 mL

## 2014-03-19 SURGICAL SUPPLY — 27 items
CHLORAPREP W/TINT 26ML (MISCELLANEOUS) ×3 IMPLANT
CLIP FILSHIE TUBAL LIGA STRL (Clip) ×3 IMPLANT
CLOTH BEACON ORANGE TIMEOUT ST (SAFETY) ×3 IMPLANT
DRSG OPSITE POSTOP 3X4 (GAUZE/BANDAGES/DRESSINGS) ×3 IMPLANT
GLOVE BIOGEL PI IND STRL 7.0 (GLOVE) IMPLANT
GLOVE BIOGEL PI IND STRL 7.5 (GLOVE) ×1 IMPLANT
GLOVE BIOGEL PI INDICATOR 7.0 (GLOVE) ×2
GLOVE BIOGEL PI INDICATOR 7.5 (GLOVE) ×2
GLOVE ECLIPSE 7.0 STRL STRAW (GLOVE) ×2 IMPLANT
GLOVE ECLIPSE 7.5 STRL STRAW (GLOVE) ×3 IMPLANT
GLOVE SURG SS PI 7.0 STRL IVOR (GLOVE) ×4 IMPLANT
GOWN STRL REUS W/TWL LRG LVL3 (GOWN DISPOSABLE) ×6 IMPLANT
LIQUID BAND (GAUZE/BANDAGES/DRESSINGS) ×2 IMPLANT
NEEDLE HYPO 22GX1.5 SAFETY (NEEDLE) ×3 IMPLANT
NS IRRIG 1000ML POUR BTL (IV SOLUTION) ×3 IMPLANT
PACK ABDOMINAL MINOR (CUSTOM PROCEDURE TRAY) ×3 IMPLANT
RETRACTOR WOUND ALXS 19CM XSML (INSTRUMENTS) IMPLANT
RTRCTR WOUND ALEXIS 19CM XSML (INSTRUMENTS) ×3
SPONGE LAP 4X18 X RAY DECT (DISPOSABLE) ×3 IMPLANT
SUT PLAIN 2 0 (SUTURE) ×3
SUT PLAIN ABS 2-0 54XMFL TIE (SUTURE) IMPLANT
SUT VICRYL 0 UR6 27IN ABS (SUTURE) ×3 IMPLANT
SUT VICRYL 4-0 PS2 18IN ABS (SUTURE) ×3 IMPLANT
SYR CONTROL 10ML LL (SYRINGE) ×3 IMPLANT
TOWEL OR 17X24 6PK STRL BLUE (TOWEL DISPOSABLE) ×6 IMPLANT
TRAY FOLEY CATH 14FR (SET/KITS/TRAYS/PACK) ×3 IMPLANT
WATER STERILE IRR 1000ML POUR (IV SOLUTION) ×3 IMPLANT

## 2014-03-19 NOTE — H&P (Signed)
Amber Branch is a 33 y.o. female presenting for ACTIVE LABOR. She is sent rapidly to labor and delivery, completely dilated. [facility] Pt is desiring postpartum BTL and has discused this during prenatal care. She has confirmed this desire through translator tonight. History OB History    Gravida Para Term Preterm AB TAB SAB Ectopic Multiple Living   9 4 4  4  4   4      Past Medical History  Diagnosis Date  . No pertinent past medical history   . History of multiple spontaneous abortions   . Gestational diabetes    Past Surgical History  Procedure Laterality Date  . No past surgeries     Family History: family history is negative for Anesthesia problems, Hypotension, Malignant hyperthermia, Pseudochol deficiency, Other, Alcohol abuse, Arthritis, Asthma, Birth defects, Cancer, COPD, Depression, Diabetes, Drug abuse, Early death, Hearing loss, Heart disease, Hyperlipidemia, Hypertension, Kidney disease, Learning disabilities, Mental illness, Mental retardation, Miscarriages / Stillbirths, Stroke, and Vision loss. Social History:  reports that she has never smoked. She has never used smokeless tobacco. She reports that she does not drink alcohol or use illicit drugs.   Prenatal Transfer Tool  Maternal Diabetes: Yes:  Diabetes Type:  Diet controlled refused 3 hr GTT "but will check CBG's" Genetic Screening: Normal Maternal Ultrasounds/Referrals: Normal x fetal macrosomia per u/s 02/28/14 Fetal Ultrasounds or other Referrals:  None Maternal Substance Abuse:  No Significant Maternal Medications:  None Significant Maternal Lab Results:  Lab values include: Group B Strep negative Other Comments:  for tubal  ROS  Dilation: Lip/rim Effacement (%): 100 Station: 0 Exam by:: Amber Branch Last menstrual period 06/24/2013, unknown if currently breastfeeding. Exam Physical Exam  Constitutional: She appears well-developed and well-nourished. She appears distressed.   HENT:  Head: Atraumatic.  Eyes: Pupils are equal, round, and reactive to light.  Neck: Neck supple.  Cardiovascular: Normal rate.   Respiratory: Effort normal.  GI: Soft.  Genitourinary:  Completely dilated upon arrival to  L&D.    Prenatal labs: ABO, Rh: O/POS/-- (07/13 1200) Antibody: NEG (07/13 1200) Rubella: 28.60 (07/13 1200) RPR: NON REAC (11/09 1429)  HBsAg: NEGATIVE (07/13 1200)  HIV: NONREACTIVE (11/09 1429)  GBS: Negative (12/28 0000)   Assessment/Plan: Pregnancy term active labor imminent delivery.   [name] V 03/19/2014, 3:48 AM

## 2014-03-19 NOTE — Progress Notes (Signed)
Interpreter in to go over plan of care today. Patient was educated on no eating or drinking until her BTL is completed. All questions answered at this time.

## 2014-03-19 NOTE — Progress Notes (Signed)
Patient ID: Elba BarmanBrenda Branch, female   DOB: Sep 28, 1980, 33 y.o.   MRN: [id number] Delivery Note At  a viable and healthy female was delivered via  (Presentation:vertex ;  ).  APGAR:9/9 , ; weight  .   Placenta status: schultz, spont expulsion, .  3V Cord:  with the following complications:precip delivery managed by RN as I entered room .  Cord pH:   Anesthesia:  None   Episiotomy:  none Lacerations:  none Suture Repair:  Est. Blood Loss (mL):  250  Mom to postpartum.  Baby to Couplet care / Skin to Skin.  Abhishek Levesque V 03/19/2014, 3:58 AM

## 2014-03-19 NOTE — Anesthesia Preprocedure Evaluation (Signed)
Anesthesia Evaluation  Patient identified by MRN, date of birth, ID band Patient awake    Reviewed: Allergy & Precautions, H&P , Patient's Chart, lab work & pertinent test results  Airway Mallampati: II  TM Distance: >3 FB Neck ROM: full    Dental no notable dental hx.    Pulmonary  breath sounds clear to auscultation  Pulmonary exam normal       Cardiovascular Exercise Tolerance: Good Rhythm:regular Rate:Normal     Neuro/Psych    GI/Hepatic   Endo/Other  diabetes  Renal/GU      Musculoskeletal   Abdominal   Peds  Hematology   Anesthesia Other Findings   Reproductive/Obstetrics                             Anesthesia Physical Anesthesia Plan  ASA: III  Anesthesia Plan: Spinal   Post-op Pain Management:    Induction:   Airway Management Planned:   Additional Equipment:   Intra-op Plan:   Post-operative Plan:   Informed Consent: I have reviewed the patients History and Physical, chart, labs and discussed the procedure including the risks, benefits and alternatives for the proposed anesthesia with the patient or authorized representative who has indicated his/her understanding and acceptance.   Dental Advisory Given  Plan Discussed with: CRNA  Anesthesia Plan Comments: (Lab work confirmed with CRNA in room. Platelets okay. Discussed spinal anesthetic, and patient consents to the procedure:  included risk of possible headache,backache, failed block, allergic reaction, and nerve injury. This patient was asked if she had any questions or concerns before the procedure started. )        Anesthesia Quick Evaluation

## 2014-03-19 NOTE — Progress Notes (Signed)
Patient ID: Amber Branch, female   DOB: [birth year], 33 y.o.   MRN: [id number] Patient desiring tubal today.  Discussed risks of surgery with patient including infection, bleeding, injury to intestines and bladder.  Pt NPO.  Consent signed.  Tubal later today.  Candelaria CelesteSTINSON, JACOB JEHIEL 03/19/2014 9:54 AM

## 2014-03-19 NOTE — Progress Notes (Signed)
Interpreter in to check on patient and update her on surgery status. Patient still waiting on surgery at this time.

## 2014-03-19 NOTE — Anesthesia Procedure Notes (Signed)

## 2014-03-19 NOTE — Lactation Note (Signed)
This note was copied from the chart of Amber Branch. Lactation Consultation Note  Patient Name: Amber Branch ZOXWR'UToday's Date: 03/19/2014 Reason for consult: Initial assessment   Initial consult at 9 hrs age; GA 38.2; BW 8 lbs, 3 oz.  Infant tested positive for Down's Syndrome on quad screen but upon physical assessment by MDs, no facial feature of Down's Syndrome noted; MD note states "low likelihood" for Down's.  Mom is a P5 with only few weeks experience breastfeeding 4 prior children; mom stated via interpreter the previous babies were still hungry after breastfeeding (implying she began supplementing after feedings).  Infant has breastfed x4 (15-35 min); voids-2; stools-0 within first 9 hrs life.  LC praised mom on infant's positive breastfeeding thus far.  Educated parents via interpreter about supply and demand, milk production, encouraged feeding with feeding cues, cluster feeding, and educated that infant should feed 8-12 times per day.  Mom denies any pain with feedings and states the baby is breastfeeding "good."  Lactation brochure given and informed of community and hospital support group, and outpatient services.  Encouraged mom to call for assistance as needed with feedings and to call for latch check.     Maternal Data Does the patient have breastfeeding experience prior to this delivery?: Yes  Feeding Feeding Type: Breast Fed Length of feed: 15 min  LATCH Score/Interventions                      Lactation Tools Discussed/Used WIC Program: No   Consult Status Consult Status: Follow-up Date: 03/20/14 Follow-up type: In-patient    Lendon KaVann, Kashay Cavenaugh Walker 03/19/2014, 1:21 PM

## 2014-03-19 NOTE — Progress Notes (Signed)
I assisted Dorene GrebeNatalie, RN with questions and Dr. Adrian BlackwaterStinson with explanation of care plan. Eda H Royal  Interpreter.

## 2014-03-19 NOTE — Transfer of Care (Signed)
Immediate Anesthesia Transfer of Care Note  Patient: Amber Branch  Procedure(s) Performed: Procedure(s): POST PARTUM TUBAL LIGATION (Bilateral)  Patient Location: PACU  Anesthesia Type:Spinal  Level of Consciousness: awake, alert  and oriented  Airway & Oxygen Therapy: Patient Spontanous Breathing  Post-op Assessment: Report given to PACU RN and Post -op Vital signs reviewed and stable  Post vital signs: Reviewed and stable  Complications: No apparent anesthesia complications

## 2014-03-19 NOTE — Anesthesia Postprocedure Evaluation (Signed)
  Anesthesia Post-op Note  Patient: Amber Branch  Procedure(s) Performed: Procedure(s): POST PARTUM TUBAL LIGATION (Bilateral)  Patient is awake, responsive, moving her legs, and has signs of resolution of her numbness. Pain and nausea are reasonably well controlled. Vital signs are stable and clinically acceptable. Oxygen saturation is clinically acceptable. There are no apparent anesthetic complications at this time. Patient is ready for discharge.

## 2014-03-19 NOTE — Op Note (Signed)
Amber DroneBrenda Branch 03/19/2014  PREOPERATIVE DIAGNOSIS:  Undesired fertility  POSTOPERATIVE DIAGNOSIS:  Undesired fertility  PROCEDURE:  Postpartum Bilateral Tubal Sterilization using Filshie Clips   SURGEON: Dr Candelaria CelesteJacob Franchelle Foskett   ANESTHESIA:  Epidural  COMPLICATIONS:  None immediate.  ESTIMATED BLOOD LOSS:  Less than 20cc.  URINE OUTPUT:  200 cc of clear urine.  INDICATIONS: 33 y.o. yo [id number]  with undesired fertility,status post vaginal delivery, desires permanent sterilization. Risks and benefits of procedure discussed with patient including permanence of method, bleeding, infection, injury to surrounding organs and need for additional procedures. Risk failure of 0.5-1% with increased risk of ectopic gestation if pregnancy occurs was also discussed with patient.   FINDINGS:  Normal uterus, tubes, and ovaries.  TECHNIQUE:  The patient was taken to the operating room where her epidural anesthesia was dosed up to surgical level and found to be adequate.  She was then placed in the dorsal supine position and prepped and draped in sterile fashion.  After an adequate timeout was performed, attention was turned to the patient's abdomen where a small transverse skin incision was made under the umbilical fold. The incision was taken down to the layer of fascia using the scalpel, and fascia was incised, and extended bilaterally using Mayo scissors. The peritoneum was entered in a sharp fashion. Attention was then turned to the patient's uterus, and left fallopian tube was identified and followed out to the fimbriated end.  A Filshie clip was placed on the left fallopian tube about 2 cm from the cornual attachment, with care given to incorporate the underlying mesosalpinx.  A similar process was carried out on the rightl side allowing for bilateral tubal sterilization.  Good hemostasis was noted overall.  Local analgesia was drizzled on both operative sites.The instruments were then removed from the  patient's abdomen and the fascial incision was repaired with 0 Vicryl.  The subcutaneous layer was closed with plain gut and the skin was closed with a 3-0 Monocryl subcuticular stitch. The patient tolerated the procedure well.  Sponge, lap, and needle counts were correct times two.  The patient was then taken to the recovery room awake, extubated and in stable condition.

## 2014-03-20 ENCOUNTER — Encounter (HOSPITAL_COMMUNITY): Payer: Self-pay | Admitting: Family Medicine

## 2014-03-20 DIAGNOSIS — Z302 Encounter for sterilization: Secondary | ICD-10-CM

## 2014-03-20 MED ORDER — DOCUSATE SODIUM 100 MG PO CAPS: 100.0000 mg | ORAL_CAPSULE | Freq: Two times a day (BID) | ORAL | Status: AC | PRN

## 2014-03-20 MED ORDER — OXYCODONE-ACETAMINOPHEN 5-325 MG PO TABS: 1.0000 | ORAL_TABLET | ORAL | Status: DC | PRN

## 2014-03-20 MED ORDER — IBUPROFEN 600 MG PO TABS: 600.0000 mg | ORAL_TABLET | Freq: Four times a day (QID) | ORAL | Status: DC | PRN

## 2014-03-20 NOTE — Progress Notes (Signed)
Interpreter in to check on patient and tell her her plan of care for today and I answered all questions.

## 2014-03-20 NOTE — Discharge Instructions (Signed)
Parto vaginal, Cuidados posteriores  °(Vaginal Delivery, Care After) °Siga estas instrucciones durante las próximas semanas. Estas indicaciones para el alta le proporcionan información general acerca de cómo deberá cuidarse después del parto. El médico también podrá darle instrucciones específicas. El tratamiento ha sido planificado según las prácticas médicas actuales, pero en algunos casos pueden ocurrir problemas. Comuníquese con el médico si tiene algún problema o tiene preguntas al volver a su casa.  °INSTRUCCIONES PARA EL CUIDADO EN EL HOGAR  °· Tome sólo medicamentos de venta libre o recetados, según las indicaciones del médico o del farmacéutico. °· No beba alcohol, especialmente si está amamantando o toma analgésicos. °· No mastique tabaco ni fume. °· No consuma drogas. °· Continúe con un adecuado cuidado perineal. El buen cuidado perineal incluye: °· Higienizarse de adelante hacia atrás. °· Mantener la zona perineal limpia. °· No use tampones ni duchas vaginales hasta que su médico la autorice. °· Dúchese, lávese el cabello y tome baños de inmersión según las indicaciones de su médico. °· Utilice un sostén que le ajuste bien y que brinde buen soporte a sus mamas. °· Consuma alimentos saludables. °· Beba suficiente líquido para mantener la orina clara o de color amarillo pálido. °· Consuma alimentos ricos en fibra como cereales y panes integrales, arroz, frijoles y frutas y verduras frescas todos los días. Estos alimentos pueden ayudarla a prevenir o aliviar el estreñimiento. °· Siga las recomendaciones de su médico relacionadas con la reanudación de actividades como subir escaleras, conducir automóviles, levantar objetos, hacer ejercicios o viajar. °· Hable con su médico acerca de reanudar la actividad sexual. Volver a la actividad sexual depende del riesgo de infección, la velocidad de la curación y la comodidad y su deseo de reanudarla. °· Trate de que alguien la ayude con las actividades del hogar y con  el recién nacido al menos durante un par de días después de salir del hospital. °· Descanse todo lo que pueda. Trate de descansar o tomar una siesta mientras el bebé está durmiendo. °· Aumente sus actividades gradualmente. °· Cumpla con todas las visitas de control programadas para después del parto. Es muy importante asistir a todas las citas programadas de seguimiento. En estas citas, su médico va a controlarla para asegurarse de que esté sanando física y emocionalmente. °SOLICITE ATENCIÓN MÉDICA SI:  °· Elimina coágulos grandes por la vagina. Guarde algunos coágulos para mostrarle al médico. °· Tiene una secreción con feo olor que proviene de la vagina. °· Tiene dificultad para orinar. °· Orina con frecuencia. °· Siente dolor al orinar. °· Nota un cambio en sus movimientos intestinales. °· Aumenta el enrojecimiento, el dolor o la hinchazón en la zona de la incisión vaginal (episiotomía) o el desgarro vaginal. °· Tiene pus que drena por la episiotomía o el desgarro vaginal. °· La episiotomía o el desgarro vaginal se abren. °· Sus mamas le duelen, están duras o enrojecidas. °· Sufre un dolor intenso de cabeza. °· Tiene visión borrosa o ve manchas. °· Se siente triste o deprimida. °· Tiene pensamientos acerca de lastimarse o dañar al recién nacido. °· Tiene preguntas acerca de su cuidado personal, el cuidado del recién nacido o acerca de los medicamentos. °· Se siente mareada o sufre un desmayo. °· Tiene una erupción. °· Tiene náuseas o vómitos. °· Usted amamantó al bebé y no ha tenido su período menstrual dentro de las 12 semanas después de dejar de amamantar. °· No amamanta al bebé y no tuvo su período menstrual en las últimas 12° semanas después del   partoLance Muss. SOLICITE ATENCIN MDICA DE INMEDIATO SI:   Siente dolor persistente.  Siente dolor en el pecho.  Le falta el aire.  Se desmaya.  Siente dolor en la pierna.  Siente Physiological scientist.  El sangrado vaginal satura dos o ms  apsitos en 1 hora. ASEGRESE DE QUE:   Comprende estas instrucciones.  Controlar su enfermedad.  Recibir ayuda de inmediato si no mejora o si empeora. Document Released: 03/09/2005 Document Revised: 11/09/2012 [facility] Patient Information 2015 [place], [name]. This information is not intended to replace advice given to you by your health care provider. Make sure you discuss any questions you have with your health care provider.  Lactancia materna (Breastfeeding) Decidir Museum/gallery exhibitions officer es una de las mejores elecciones que puede hacer por usted y su beb. El cambio hormonal durante el Psychiatrist produce el desarrollo del tejido mamario y [place] la cantidad y el tamao de los conductos galactforos. Estas hormonas tambin permiten que las protenas, los azcares y las grasas de la sangre produzcan la [employer] Resources materna en las glndulas productoras de [place]. Las hormonas impiden que la leche materna sea liberada antes del nacimiento del beb, adems de impulsar el flujo de leche luego del nacimiento. Una vez que ha comenzado a Museum/gallery exhibitions officer, Conservation officer, nature beb, as Immunologist succin o Theatre manager, pueden estimular la liberacin de [place] de las glndulas productoras de [place].  LOS BENEFICIOS DE AMAMANTAR Para el beb  La primera leche (calostro) ayuda a Careers information officer funcionamiento del sistema digestivo del beb.  La leche tiene anticuerpos que ayudan a Radio producer las infecciones en el beb.  El beb tiene una menor incidencia de asma, alergias y del sndrome de muerte sbita del lactante.  Los nutrientes en la [place] materna son mejores para el beb que la [place] maternizada y estn preparados exclusivamente para cubrir las necesidades del beb.  La leche materna mejora el desarrollo cerebral del beb.  Es menos probable que el beb desarrolle otras enfermedades, como obesidad infantil, asma o diabetes mellitus de tipo 2. Para usted   La lactancia materna favorece el desarrollo de un vnculo muy especial entre la  madre y el beb.  Es conveniente. La leche materna siempre est disponible a la Human resources officer y es [place].  La lactancia materna ayuda a quemar caloras y a perder el peso ganado durante el [place].  Favorece la contraccin del tero al tamao que tena antes del embarazo de manera ms rpida y disminuye el sangrado (loquios) despus del parto.  La lactancia materna contribuye a reducir Nurse, adult de desarrollar diabetes mellitus de tipo 2, osteoporosis o cncer de mama o de ovario en el futuro. SIGNOS DE QUE EL BEB EST HAMBRIENTO Primeros signos de [address] de [place].  Se estira.  Mueve la cabeza de un lado a otro.  Mueve la cabeza y abre la boca cuando se le toca la mejilla o la comisura de la boca (reflejo de bsqueda).  Aumenta las vocalizaciones, tales como sonidos de succin, se relame los labios, emite arrullos, suspiros, o chirridos.  Mueve la [employer] boca.  Se chupa con ganas los dedos o las manos. Signos tardos de Fisher [employer].  Llora de manera intermitente. Signos de [employer] Corporation signos de hambre extrema requerirn que lo calme y lo consuele antes de que el beb pueda alimentarse adecuadamente. No espere a que se manifiesten los siguientes signos de hambre extrema para comenzar a Museum/gallery exhibitions officer:  Agitacin.  Llanto intenso y fuerte.   Gritos. INFORMACIN BSICA SOBRE LA LACTANCIA MATERNA Iniciacin de la lactancia materna  Encuentre un lugar cmodo para sentarse o acostarse, con un buen respaldo para el cuello y la espalda.  Coloque una almohada o una manta enrollada debajo del beb para acomodarlo a la altura de la mama (si est sentada). Las almohadas para Museum/gallery exhibitions officer se han diseado especialmente a fin de servir de apoyo para los brazos y el beb [employer].  Asegrese de que el abdomen del beb est frente al suyo.  Masajee suavemente la mama. Con las yemas de los dedos, masajee la pared del  pecho hacia el pezn en un movimiento circular. Esto estimula el flujo de [place]. Es posible que Engineer, manufacturing systems este movimiento mientras amamanta si la leche fluye lentamente.  Sostenga la mama con el pulgar por arriba del pezn y los otros 4 dedos por debajo de la mama. Asegrese de que los dedos se encuentren lejos del pezn y de la boca del beb.  Empuje suavemente los labios del beb con el pezn o con el dedo.  Cuando la boca del beb se abra lo suficiente, acrquelo rpidamente a la mama e introduzca todo el pezn y la zona oscura que lo rodea (areola), tanto como sea posible, dentro de la boca del beb.  Debe haber ms areola visible por arriba del labio superior del beb que por debajo del labio inferior.  La lengua del beb debe estar entre la enca inferior y la [place].  Asegrese de que la boca del beb est en la posicin correcta alrededor del pezn (prendida). Los labios del beb deben crear un sello sobre la mama y estar doblados hacia afuera (invertidos).  Es comn que el beb succione durante 2 a 3 minutos para que comience el flujo de [place]. Cmo debe prenderse Es muy importante que le ensee al beb cmo prenderse adecuadamente a la mama. Si el beb no se prende adecuadamente, puede causarle dolor en el pezn y reducir la produccin de [place], y hacer que el beb tenga un escaso aumento de [place]. Adems, si el beb no se prende adecuadamente al pezn, puede tragar aire durante la alimentacin. Esto puede causarle molestias al beb. Hacer eructar al beb al [place] de mama puede ayudarlo a liberar el aire. Sin embargo, ensearle al beb cmo prenderse a la mama adecuadamente es la mejor manera de evitar que se sienta molesto por tragar Oceanographer se alimenta. Signos de que el beb se ha prendido adecuadamente al pezn:   Payton Doughty o succiona de modo silencioso, sin causarle dolor.  Se escucha que traga cada 3 o 4 succiones.   Hay movimientos musculares por arriba  y por delante de sus odos al Printmaker. Signos de que el beb no se ha prendido Audiological scientist al pezn:   Hace ruidos de succin o de chasquido mientras se alimenta.  Siente dolor en el pezn. Si cree que el beb no se prendi correctamente, deslice el dedo en la comisura de la boca y [employer] las encas del beb para interrumpir la succin. Intente comenzar a amamantar nuevamente. Signos de Fish farm manager Signos del beb:   Disminuye gradualmente el nmero de succiones o cesa la succin por completo.  Se duerme.  Relaja el cuerpo.  Retiene una pequea cantidad de [facility] boca.  Se desprende solo del pecho. Signos que presenta usted:  Las mamas han aumentado la firmeza, el peso y el tamao 1  a 3 horas despus de amamantar.  Estn ms blandas inmediatamente despus de amamantar.  Un aumento del volumen de [place], y tambin un cambio en su consistencia y color se producen hacia el quinto da de Tour manager.  Los pezones no duelen, ni estn agrietados ni sangran. Signos de que su beb recibe la cantidad de leche suficiente  Moja al menos 3 paales en 24 horas. La orina debe ser clara y de color amarillo plido a los [address] de [place].  Defeca al menos 3 veces en 24 horas a los [address]  [address] de [address] La materia fecal debe ser blanda y [place].  Defeca al menos 3 veces en 24 horas a los [address] de [address] La materia fecal debe ser grumosa y [place].  No registra una prdida de peso mayor del 10% del peso al nacer durante los primeros 3 [address]  [address] de [place].  Aumenta de peso un promedio de 4 a 7onzas (113 a 198g) por semana despus de los [address] de vida.  Aumenta de [place], Tripp, de [place] uniforme a Glass blower/designer de los [address] de vida, sin Passenger transport manager prdida de peso despus de las 2semanas de vida. Despus de alimentarse, es posible que el beb regurgite una pequea cantidad. Esto es frecuente. FRECUENCIA Y DURACIN DE LA LACTANCIA MATERNA El amamantamiento  frecuente la ayudar a producir ms [place] y a Education officer, community de Engineer, mining en los pezones e hinchazn en las [place]. Alimente al beb cuando muestre signos de hambre o si siente la necesidad de reducir la congestin de las [place]. Esto se denomina "lactancia a demanda". Evite el uso del chupete mientras trabaja para establecer la lactancia (las primeras 4 a 6 semanas despus del nacimiento del beb). Despus de este perodo, podr ofrecerle un chupete. Las investigaciones demostraron que el uso del chupete durante el primer ao de vida del beb disminuye el riesgo de desarrollar el sndrome de muerte sbita del lactante (SMSL). Permita que el nio se alimente en cada mama todo lo que desee. Contine amamantando al beb hasta que haya terminado de alimentarse. Cuando el beb se desprende o se queda dormido mientras se est alimentando de la primera mama, ofrzcale la segunda. Debido a que, con frecuencia, los recin [place] las primeras semanas de vida, es posible que deba despertar al beb para alimentarlo. Los horarios de Acupuncturist de un beb a otro. Sin embargo, las siguientes reglas pueden servir como gua para ayudarla a Lawyer que el beb se alimenta adecuadamente:  Se puede amamantar a los recin nacidos (bebs de 4 semanas o menos de vida) cada 1 a 3 horas.  No deben transcurrir ms de 3 horas durante el da o 5 horas durante la noche sin que se amamante a los recin nacidos.  Debe amamantar al beb 8 veces como mnimo en un perodo de 24 horas, hasta que comience a introducir slidos en su dieta, a los 6 meses de vida aproximadamente. EXTRACCIN DE [employer] MATERNA La extraccin y Contractor de la leche materna le permiten asegurarse de que el beb se alimente exclusivamente de [place], aun en momentos en los que no puede amamantar. Esto tiene especial importancia si debe regresar al Aleen Campi en el perodo en que an est amamantando o si no puede estar  presente en los momentos en que el beb debe alimentarse. Su asesor en lactancia puede orientarla sobre cunto tiempo es seguro almacenar [place].  El sacaleche es un aparato que le permite extraer [place] de la  mama a un recipiente estril. Luego, la leche materna extrada puede almacenarse en un refrigerador o Electrical engineercongelador. Algunos sacaleches son Birdie Riddlemanuales, Delaney Meigsmientras que otros son elctricos. Consulte a su asesor en lactancia qu tipo ser ms conveniente para usted. Los sacaleches se pueden comprar; sin embargo, algunos hospitales y grupos de apoyo a la lactancia materna alquilan Sports coachsacaleches mensualmente. Un asesor en lactancia puede ensearle cmo extraer W. R. Berkleyleche materna manualmente, en caso de que prefiera no usar un sacaleche.  CMO CUIDAR LAS MAMAS DURANTE LA LACTANCIA MATERNA Los pezones se secan, agrietan y duelen durante la Tour managerlactancia materna. Las siguientes recomendaciones pueden ayudarla a Pharmacologistmantener las TEPPCO Partnersmamas humectadas y sanas:  Careers information officervite usar jabn en los pezones.  Use un sostn de soporte. Aunque no son esenciales, las camisetas sin mangas o los sostenes especiales para Museum/gallery exhibitions officeramamantar estn diseados para acceder fcilmente a las mamas, para Museum/gallery exhibitions officeramamantar sin tener que quitarse todo el sostn o la camiseta. Evite usar sostenes con aro o sostenes muy ajustados.  Seque al aire sus pezones durante 3 a 4minutos despus de amamantar al beb.  Utilice solo apsitos de Haematologistalgodn en el sostn para Environmental health practitionerabsorber las prdidas de [place]woodleche. La prdida de un poco de Public Service Enterprise Groupleche materna entre las tomas es normal.  Utilice lanolina sobre los pezones luego de Museum/gallery exhibitions officeramamantar. La lanolina ayuda a mantener la humedad normal de la piel. Si Botswanausa lanolina pura, no tiene que lavarse los pezones antes de volver a Corporate treasureralimentar al beb. La lanolina pura no es txica para el beb. Adems, puede extraer Beazer Homesmanualmente algunas gotas de [place] materna y Engineer, maintenance (IT)masajear suavemente esa Winn-Dixieleche sobre los pezones, para que la Somertonleche se seque al aire. Durante las primeras  semanas despus de dar a luz, algunas mujeres pueden experimentar hinchazn en las mamas (congestin [place]mamaria). La congestin puede hacer que sienta las mamas pesadas, calientes y sensibles al tacto. El pico de la congestin ocurre dentro de los 3 a 5 das despus del [place]part[place]. Las siguientes recomendaciones pueden ayudarla a Paramedicaliviar la congestin:  Vace por completo las mamas al QUALCOMMamamantar o Environmental health practitionerextraer leche. Puede aplicar calor hmedo en las mamas (en la ducha o con toallas hmedas para manos) antes de Museum/gallery exhibitions officeramamantar o extraer WPS Resourcesleche. Esto aumenta la circulacin y [place] and the Grenadinesayuda a que la Grimeslandleche fluya. Si el beb no vaca por completo las [address] D[address]amas cuando lo [address]amamanta, extraiga la Kirvinleche restante despus de que haya finalizado.  Use un sostn ajustado (para amamantar o comn) o una camiseta sin mangas durante 1 o 2 das para indicar al cuerpo que disminuya ligeramente la produccin de Tajiqueleche.  Aplique compresas de hielo Yahoo! Incsobre las mamas, a menos que le resulte demasiado incmodo.  Asegrese de que el beb est prendido y se encuentre en la posicin correcta mientras lo alimenta. Si la congestin persiste luego de 48 horas o despus de seguir estas recomendaciones, comunquese con su mdico o un Holiday representativeasesor en lactancia. RECOMENDACIONES GENERALES PARA EL CUIDADO DE LA SALUD DURANTE LA LACTANCIA MATERNA  Consuma alimentos saludables. Alterne comidas y colaciones, y coma 3 de cada una por da. Dado que lo que come Danaher Corporationafecta la leche materna, es posible que algunas comidas hagan que su beb se vuelva ms irritable de lo habitual. Evite comer este tipo de alimentos si percibe que afectan de manera negativa al beb.  Beba leche, jugos de fruta y agua para Patent examinersatisfacer su sed (aproximadamente 10 vasos al Futures traderda).  Descanse con frecuencia, reljese y tome sus vitaminas prenatales para evitar la fatiga, el estrs y la anemia.  Contine con los autocontroles  de la mama.  Evite masticar y fumar tabaco.  Evite el consumo de alcohol y  drogas. Algunos medicamentos, que pueden ser perjudiciales para el beb, pueden pasar a travs de la Colgate Palmoliveleche materna. Es importante que consulte a su mdico antes de Medical sales representativetomar cualquier medicamento, incluidos todos los medicamentos recetados y de [place] Pasadenaventa libre, as como los suplementos vitamnicos y herbales. Puede quedar embarazada durante la lactancia. Si desea controlar la natalidad, consulte a su mdico cules son las opciones ms seguras para el beb. SOLICITE ATENCIN MDICA SI:   Usted siente que quiere dejar de Museum/gallery exhibitions officeramamantar o se siente frustrada con la lactancia.  Siente dolor en las mamas o en los pezones.  Sus pezones estn agrietados o Water quality scientistsangran.  Sus pechos estn irritados, sensibles o calientes.  Tiene un rea hinchada en cualquiera de las mamas.  Siente escalofros o fiebre.  Tiene nuseas o vmitos.  Presenta una secrecin de otro lquido distinto de la leche materna de los pezones.  Sus mamas no se llenan antes de Museum/gallery exhibitions officeramamantar al beb para el quinto da despus del [place]parto.  Se siente triste y deprimida.  El beb est demasiado somnoliento como para comer bien.  El beb tiene problemas para dormir.  Moja menos de 3 paales en 24 horas.  Defeca menos de 3 veces en 24 horas.  La piel del beb o la parte blanca de los ojos se vuelven amarillentas.  El beb no ha aumentado de Manns Choicepeso a los [address] das de [place]ida. SOLICITE ATENCIN MDICA DE INMEDIATO SI:   El beb est muy cansado Retail buyer(letargo) y no se quiere despertar para comer.  Le sube la fiebre sin causa. Document Released: 03/09/2005 Document Revised: 03/14/2013 [facility] Patient Information 2015 [place], [place]. This information is not intended to replace advice given to you by your health care provider. Make sure you discuss any questions you have with your health care provider.

## 2014-03-20 NOTE — Progress Notes (Signed)
Interpreter in to do DC teaching with patient. All questions answered.

## 2014-03-20 NOTE — Progress Notes (Signed)
I assisted Natalie,RN with explanation of care plan for Mom and Baby. Eda H Royal  Interpreter.

## 2014-03-20 NOTE — Progress Notes (Signed)
I assisted Clydie BraunKaren, [place] with questions and [place] CNM with discharge information. Eda H Royal  Interpreter.

## 2014-03-20 NOTE — Progress Notes (Signed)
I stopped by patient's room to check on her need, by Viria Alvarez, Interpreter °

## 2014-03-20 NOTE — Progress Notes (Signed)
I ordered patient's lunch.  Eda H Royal  Interpreter. °

## 2014-03-20 NOTE — Discharge Summary (Signed)
Obstetric Discharge Summary Reason for Admission: onset of labor Prenatal Procedures: NST and ultrasound Intrapartum Procedures: spontaneous vaginal delivery Postpartum Procedures: P.P. tubal ligation Complications-Operative and Postpartum: none HEMOGLOBIN  Date Value Ref Range Status  03/19/2014 12.1 12.0 - 15.0 g/dL Final   HCT  Date Value Ref Range Status  03/19/2014 36.7 36.0 - 46.0 % Final    Physical Exam:  General: alert, cooperative, appears older than stated age, no distress and moderately obese Lochia: appropriate Uterine Fundus: firm Incision: no significant drainage  Discharge Diagnoses: Term Pregnancy-delivered  Discharge Information: Date: 03/20/2014 Activity: pelvic rest Diet: routine Medications: PNV, Ibuprofen, Colace and Percocet Condition: stable Instructions: refer to practice specific booklet Discharge to: home Follow-up Information    [facility]   Specialty:  Obstetrics and Gynecology   Contact information:   [address] [place] [id number] [phone]      [facility].   Why:  As needed in emergencies   Contact information:   [address] [id number] [address]c [place] [id number] [phone]      Newborn Data: Live born female  Birth Weight: 8 lb 8.3 oz (3865 g) APGAR: 9, 9  Home with mother.  Shon Mansouri 03/20/2014, 11:40 AM

## 2014-03-20 NOTE — Addendum Note (Signed)
Addendum  created 03/20/14 0805 by Elbert Ewingsolleen S Mikeya Tomasetti, CRNA   Modules edited: Notes Section   Notes Section:  File: [id number]

## 2014-03-20 NOTE — Anesthesia Postprocedure Evaluation (Signed)
  Anesthesia Post-op Note  Patient: [name]  Procedure(s) Performed: Procedure(s): POST PARTUM TUBAL LIGATION (Bilateral)  Patient Location: PACU and Mother/Baby  Anesthesia Type:Spinal  Level of Consciousness: awake, alert  and oriented  Airway and Oxygen Therapy: Patient Spontanous Breathing  Post-op Pain: mild  Post-op Assessment: Post-op Vital signs reviewed, Patient's Cardiovascular Status Stable, Respiratory Function Stable, No signs of Nausea or vomiting, Adequate PO intake, Pain level controlled, No headache, No backache, No residual numbness and No residual motor weakness  Post-op Vital Signs: Reviewed and stable  Last Vitals:  Filed Vitals:   03/20/14 0655  BP: 94/59  Pulse: 61  Temp: 36.7 C  Resp: 18    Complications: No apparent anesthesia complications

## 2014-04-18 ENCOUNTER — Encounter: Payer: Self-pay | Admitting: Obstetrics & Gynecology

## 2014-04-18 ENCOUNTER — Ambulatory Visit (INDEPENDENT_AMBULATORY_CARE_PROVIDER_SITE_OTHER): Payer: BC Managed Care – PPO | Admitting: Obstetrics & Gynecology

## 2014-04-18 NOTE — Progress Notes (Signed)
    Subjective:     Amber Branch is a 34 y.o. [phone] female who presents for a postpartum visit. Patient is Spanish-speaking only, Spanish interpreter present for this encounter. She is 4 weeks postpartum following a spontaneous vaginal delivery.  I have fully reviewed the prenatal and intrapartum course. The delivery was at 38.2 gestational weeks. Anesthesia: none. Postpartum course has been uncomplicated. Baby's course has been uncomplicated. Baby is feeding by bottle. Bleeding no bleeding. Bowel function is normal. Bladder function is normal. Patient is not sexually active. Contraception method is tubal ligation. Postpartum depression screening: negative.  The following portions of the patient's history were reviewed and updated as appropriate: allergies, current medications, past family history, past medical history, past social history, past surgical history and problem list.   Normal pap and negative HRHPV in 10/02/13.  Review of Systems Pertinent items are noted in HPI.   Objective:    BP 112/73 mmHg  Pulse 55  Temp(Src) 97.8 F (36.6 C) (Oral)  Resp 20  Ht 5' (1.524 m)  Wt 174 lb 12.8 oz (79.289 kg)  BMI 34.14 kg/m2  Breastfeeding? No  General:  alert and no distress   Breasts:  deferred  Lungs: clear to auscultation bilaterally  Heart:  regular rate and rhythm  Abdomen: soft, non-tender; bowel sounds normal; no masses,  no organomegaly.  BTS incision is C/D/I, no erythema.  Pelvic:  not evaluated        Assessment:   Normal postpartum exam. Pap smear not done at today's visit.   Plan:   1. Contraception: tubal ligation 2. Follow up as needed.    [name], MD, FACOG Attending Obstetrician & Gynecologist Faculty Practice, [facility]

## 2015-03-03 IMAGING — US US OB FOLLOW-UP
1 series · 12 of 28 positions shown · non-contrast
Comparison: none

[Series 1: us ob follow-up · 0.30mm/px · 12 of 30 slices shown]
[im 2/30]
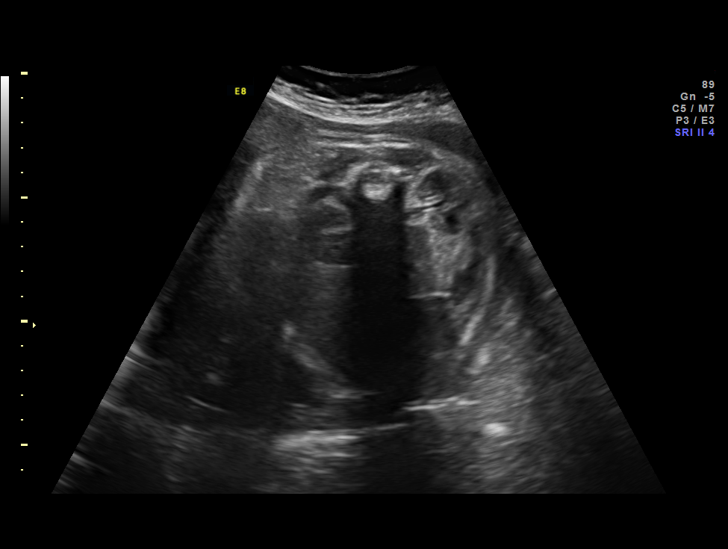
[im 4/30]
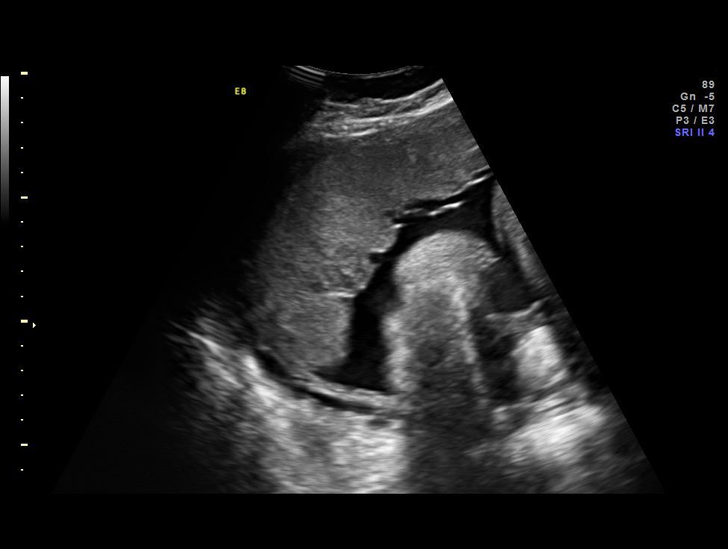
[im 6/30]
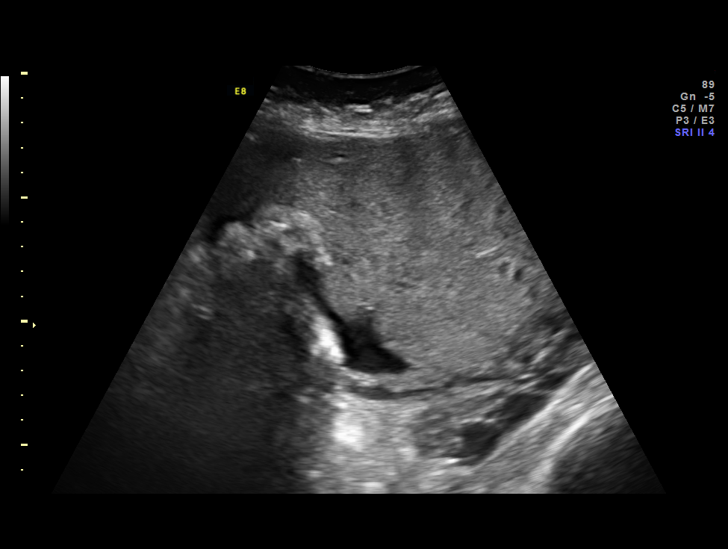
[im 9/30]
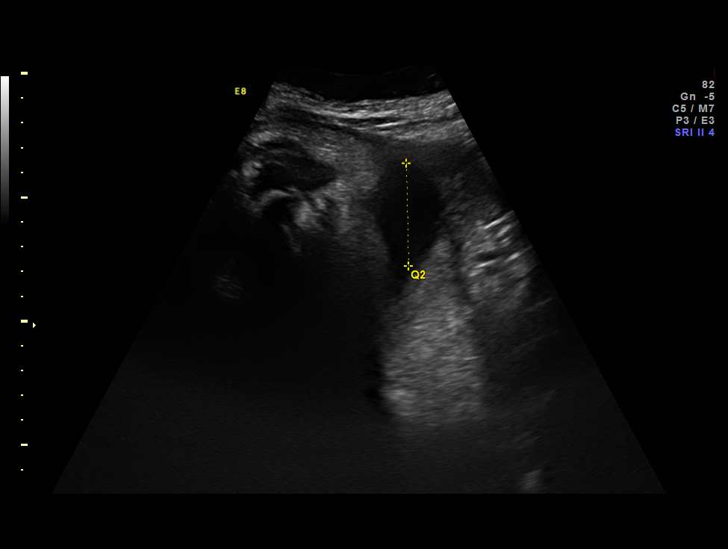
[im 11/30]
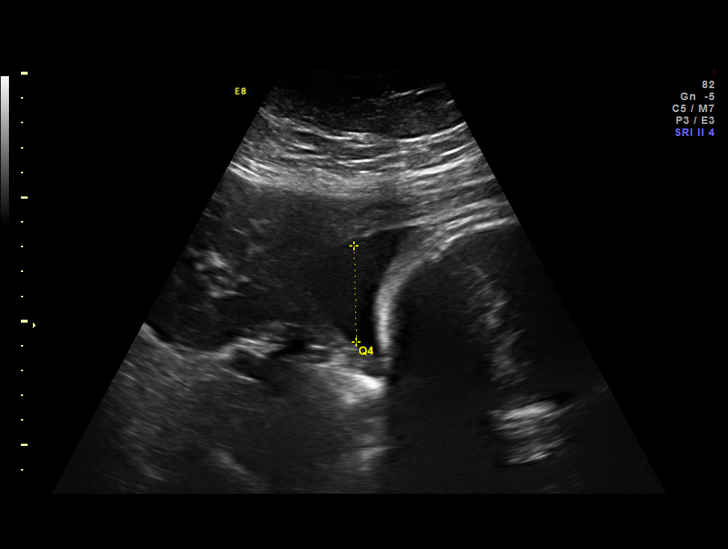
[im 13/30]
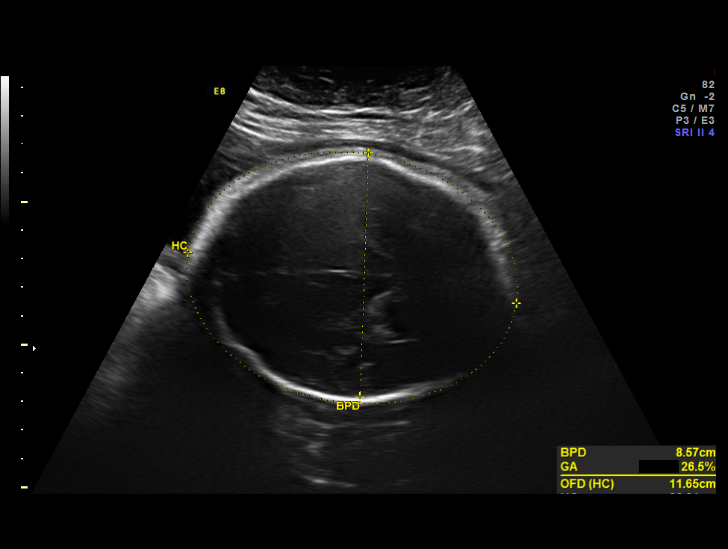
[im 17/30]
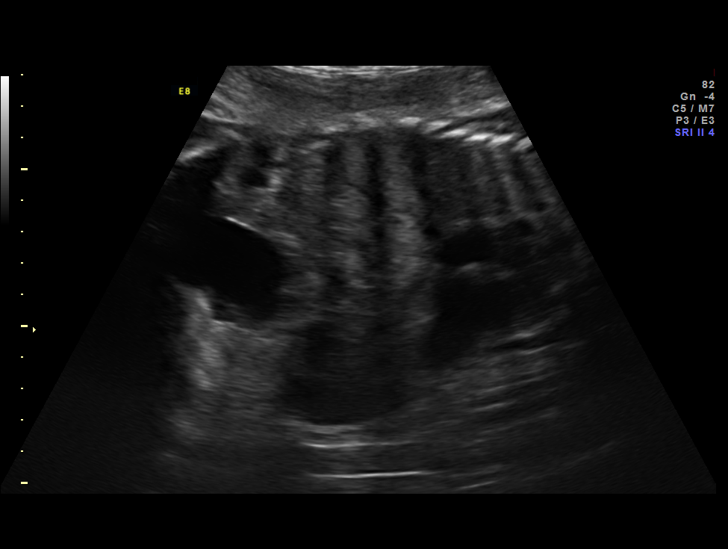
[im 19/30]
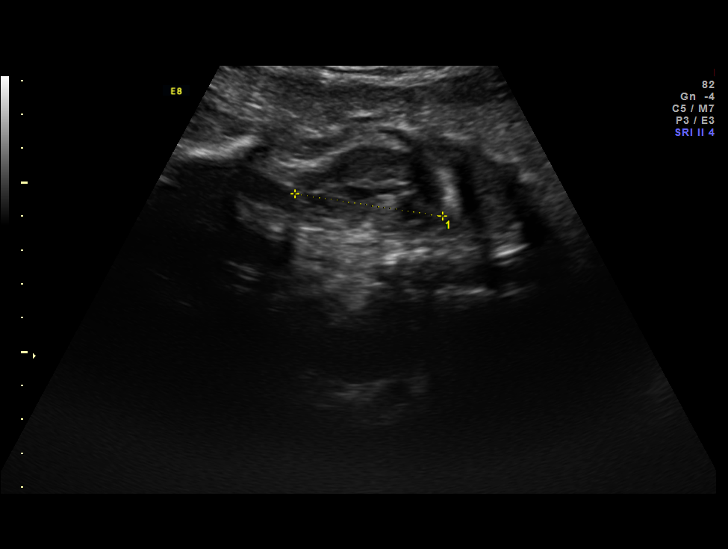
[im 21/30]
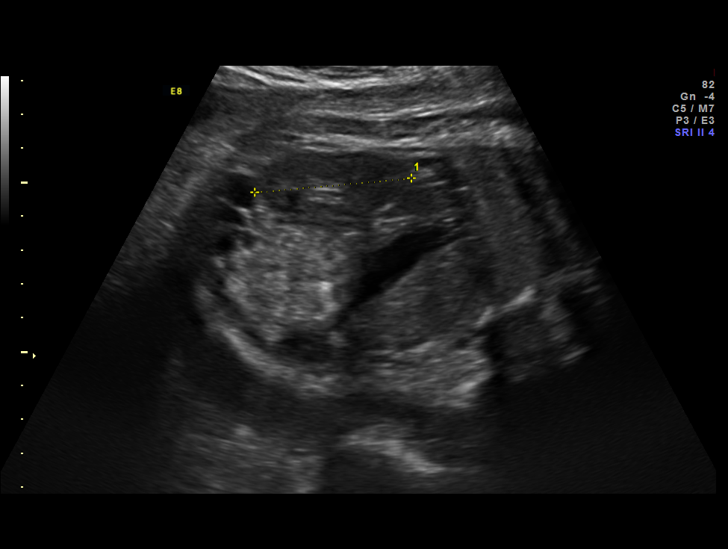
[im 24/30]
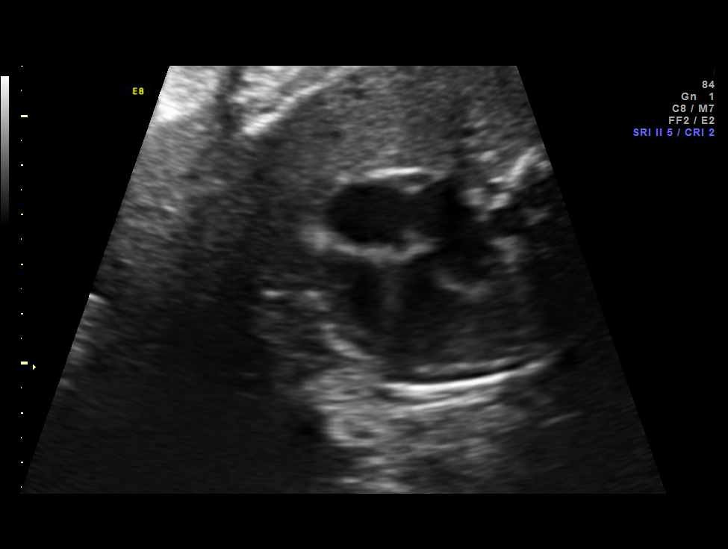
[im 26/30]
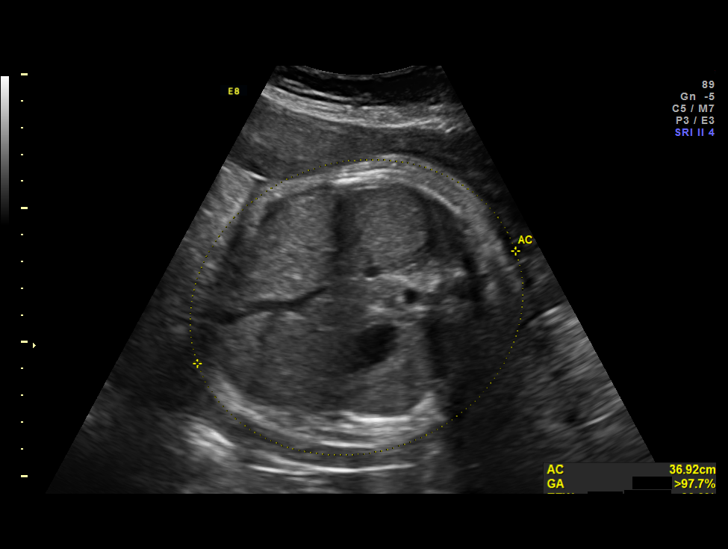
[im 28/30]
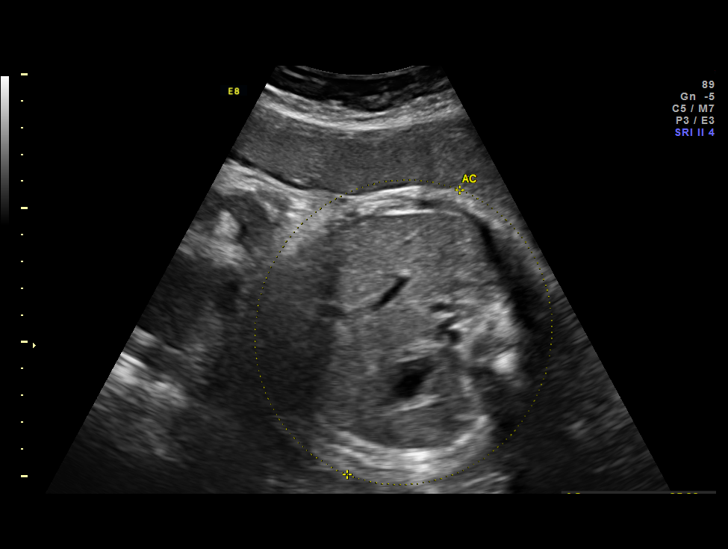

[12 of 28 positions shown; findings below may reference images not displayed]

OBSTETRICS REPORT
                      (Signed Final 02/28/2014 [DATE])

Service(s) Provided

 US OB FOLLOW UP                                       76816.1
Indications

 Poor obstetric history-Recurrent (habitual) abortion
 (3 consecutive ab's)
 Poor obstetric history: Previous gestational
 diabetes
 Maternal morbid obesity
 Abnormal biochemical screen (quad) for Trisomy
 21 ([DATE]) -declined testing
 Elevated Inhibin
 35 weeks gestation of pregnancy
 Gestational diabetes in childbirth, diet controlled
Fetal Evaluation

 Num Of Fetuses:    1
 Fetal Heart Rate:  165                          bpm
 Cardiac Activity:  Observed
 Presentation:      Cephalic
 Placenta:          Anterior, above cervical os
 P. Cord            Previously Visualized
 Insertion:

 Amniotic Fluid
 AFI FV:      Subjectively within normal limits
 AFI Sum:     16.1    cm       59  %Tile      Larg Pckt:   4.49  cm
 RUQ:   4.49    cm   RLQ:    3.88   cm    LUQ:   4.15    cm    LLQ:   3.58    cm
Biometry

 BPD:     85.2  mm     G. Age:  34w 2d                CI:          72.9  70 - 86
 OFD:    116.8  mm                                    FL/HC:       21.0  20.1 -

 HC:     321.9  mm     G. Age:  36w 3d       37   %   HC/AC:       0.88  0.93 -

 AC:       366  mm     G. Age:  40w 4d     > 97   %   FL/BPD:      79.2  71 - 87
 FL:      67.5  mm     G. Age:  34w 5d       23   %   FL/AC:       18.4  20 - 24
 HUM:     60.2  mm     G. Age:  35w 0d       51   %

 Est. FW:    7788   gm     7 lb 7 oz   > 90  %
Gestational Age
 LMP:           35w 4d        Date:  06/24/13                 EDD:   03/31/14
 U/S Today:     36w 4d                                        EDD:   03/24/14
 Best:          35w 4d     Det. By:  LMP  (06/24/13)          EDD:   03/31/14
Anatomy

 Cranium:          Previously seen        Aortic Arch:      Previously seen
 Fetal Cavum:      Previously seen        Ductal Arch:      Previously seen
 Ventricles:       Previously seen        Diaphragm:        Appears normal
 Choroid Plexus:   Previously seen        Stomach:          Appears normal, left
                                                            sided
 Cerebellum:       Previously seen        Abdomen:          Previously seen
 Posterior Fossa:  Previously seen        Abdominal Wall:   Previously seen
 Nuchal Fold:      Previously seen        Cord Vessels:     Previously seen
 Face:             Orbits and profile     Kidneys:          Appear normal
                   previously seen
 Lips:             Previously seen        Bladder:          Appears normal
 Heart:            Appears normal         Spine:            Previously seen
                   (4CH, axis, and
                   situs)
 RVOT:             Previously seen        Lower             Previously seen
                                          Extremities:
 LVOT:             Previously seen        Upper             Previously seen
                                          Extremities:

 Other:  Heels and 5th digit previously seen.. Fetus appears to be a female.
Cervix Uterus Adnexa

 Cervix:       Not visualized (advanced GA >62wks)
Impression

 Single IUP at 35w 4d
 Diet controlled GDM - declined 3-hr OGTT
 The estimated fetal weight today is > 90th %tile (3377g).  The
 AC measures > 97th %tile.
 Anterior placenta without previa
 Normal amniotic fluid volume - previously noted
 polyhydramnios appears to have resolved
Recommendations

 Given new finding of fetal macrosomia, recommend 2x
 weekly NSTs with weekly AFIs
 Would also consider delivery at 39 weeks given this finding
 At risk for LGA infant - follow labor curve closely.  Would
 avoid operative vaginal delivery if possible.

## 2015-06-24 ENCOUNTER — Ambulatory Visit (INDEPENDENT_AMBULATORY_CARE_PROVIDER_SITE_OTHER): Payer: BLUE CROSS/BLUE SHIELD | Admitting: Student

## 2015-06-24 ENCOUNTER — Encounter: Payer: Self-pay | Admitting: Student

## 2015-06-24 VITALS — BP 115/82 | HR 65 | Temp 97.6°F | Ht 60.0 in | Wt 189.0 lb

## 2015-06-24 DIAGNOSIS — M79604 Pain in right leg: Secondary | ICD-10-CM | POA: Diagnosis not present

## 2015-06-24 DIAGNOSIS — Z9884 Bariatric surgery status: Secondary | ICD-10-CM

## 2015-06-24 LAB — POCT GLYCOSYLATED HEMOGLOBIN (HGB A1C): Hemoglobin A1C: 5.2

## 2015-06-24 NOTE — Assessment & Plan Note (Signed)
Concern for continuing chronic right radiculopathy. However given progression of symptoms there is concerning for potential progression of back pathology. However patient denies back pain. - Will obtain xray lumbar spine

## 2015-06-24 NOTE — Patient Instructions (Signed)
Please bring the completed form back to the clinic An x-ray was ordered use should go to the hospital to obtain this If you have questions or concerns call the office at [phone]

## 2015-06-24 NOTE — Progress Notes (Signed)
   Subjective:    Patient ID: Amber Branch, female    DOB: [birth year], 35 y.o.   MRN: [id number]  Spanish interpreter used for entirety of the visit  CC: Physical exam  HPI: 35 year old female presenting for physical exam. She is also interested in bariatric surgery referral  Bariatric surgery - Patient reports she has tried multiple things for weight loss unsuccessfully. 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 - She she has previously been documented to have low back pain with radiation to the left leg. She denies left leg pain at this time - Patient reports pain has started to extend to the bottom of her right foot - She denies weakness, loss of bowel or bladder function - She denies back pain  Review of Systems ROS Per history of present illness otherwise she denies recent illnesses, fevers, nausea, vomiting, diarrhea, abdominal pain Past Medical, Surgical, Social, and Family History Reviewed & Updated per EMR.   Objective:  BP 115/82 mmHg  Pulse 65  Temp(Src) 97.6 F (36.4 C) (Oral)  Ht 5' (1.524 m)  Wt 189 lb (85.73 kg)  BMI 36.91 kg/m2 Vitals and nursing note reviewed  General: NAD Cardiac: RRR Respiratory: CTAB, normal effort Extremities: no edema or cyanosis. WWP. Neg straight left leg raise, pain to right thigh on straight leg raise,. 5/5 strength bilaterally Skin: warm and dry, no rashes noted Neuro: alert and oriented, no focal deficits   Assessment & Plan:    Right leg pain Concern for continuing chronic right radiculopathy. However given progression of symptoms there is concerning for potential progression of back pathology. However patient denies back pain. - Will obtain xray lumbar spine  Bariatric surgery status BMI is 36.9 making her a good candidate for bariatric surgery should she prove that she has been unable to lose  weight with multiple methods tried.  -BMP and A1c ordered today.  - Patient to fill out her portion of weight loss methods tried and failed and bring to clinic     Lavonn Maxcy A. Kennon RoundsHaney MD, MS Family Medicine Resident PGY-2 Pager [phone]

## 2015-06-24 NOTE — Assessment & Plan Note (Signed)
BMI is 36.9 making her a good candidate for bariatric surgery should she prove that she has been unable to lose weight with multiple methods tried.  -BMP and A1c ordered today.  - Patient to fill out her portion of weight loss methods tried and failed and bring to clinic

## 2015-06-25 LAB — BASIC METABOLIC PANEL
BUN: 11 mg/dL (ref 7–25)
CO2: 24 mmol/L (ref 20–31)
Calcium: 9.1 mg/dL (ref 8.6–10.2)
Chloride: 104 mmol/L (ref 98–110)
Creat: 0.38 mg/dL — ABNORMAL LOW (ref 0.50–1.10)
Glucose, Bld: 87 mg/dL (ref 65–99)
POTASSIUM: 4.1 mmol/L (ref 3.5–5.3)
SODIUM: 138 mmol/L (ref 135–146)

## 2015-07-22 ENCOUNTER — Other Ambulatory Visit (HOSPITAL_COMMUNITY): Payer: Self-pay | Admitting: General Surgery

## 2015-07-24 ENCOUNTER — Ambulatory Visit (HOSPITAL_COMMUNITY)
Admission: RE | Admit: 2015-07-24 | Discharge: 2015-07-24 | Disposition: A | Discharge status: Home / Self Care | Payer: BC Managed Care – PPO | Source: Ambulatory Visit | Attending: Family Medicine | Admitting: Family Medicine

## 2015-07-24 DIAGNOSIS — Z9884 Bariatric surgery status: Secondary | ICD-10-CM | POA: Insufficient documentation

## 2015-07-24 DIAGNOSIS — M5416 Radiculopathy, lumbar region: Secondary | ICD-10-CM | POA: Insufficient documentation

## 2015-07-31 ENCOUNTER — Ambulatory Visit (HOSPITAL_COMMUNITY)
Admission: RE | Admit: 2015-07-31 | Discharge: 2015-07-31 | Disposition: A | Discharge status: Home / Self Care | Payer: BLUE CROSS/BLUE SHIELD | Source: Ambulatory Visit | Attending: General Surgery | Admitting: General Surgery

## 2015-07-31 ENCOUNTER — Other Ambulatory Visit: Payer: Self-pay

## 2015-07-31 DIAGNOSIS — Z6835 Body mass index (BMI) 35.0-35.9, adult: Secondary | ICD-10-CM | POA: Insufficient documentation

## 2015-07-31 DIAGNOSIS — E669 Obesity, unspecified: Secondary | ICD-10-CM | POA: Diagnosis present

## 2015-08-22 ENCOUNTER — Encounter: Payer: BLUE CROSS/BLUE SHIELD | Attending: General Surgery | Admitting: Skilled Nursing Facility1

## 2015-08-22 ENCOUNTER — Encounter: Payer: Self-pay | Admitting: Skilled Nursing Facility1

## 2015-08-22 VITALS — Ht 60.0 in | Wt 194.0 lb

## 2015-08-22 DIAGNOSIS — Z01818 Encounter for other preprocedural examination: Secondary | ICD-10-CM | POA: Insufficient documentation

## 2015-08-22 DIAGNOSIS — E669 Obesity, unspecified: Secondary | ICD-10-CM

## 2015-08-22 NOTE — Progress Notes (Signed)
  Pre-Op Assessment Visit:  Pre-Operative Sleeve Gastrectomy Surgery  Medical Nutrition Therapy:  Appt start time: 1600   End time:  1700.  Patient was seen on 08/22/2015 for Pre-Operative Nutrition Assessment. Assessment and letter of approval faxed to [facility]ric [facility] Program coordinator on 08/22/2015.  Interpretor: Onalee Huaavid [phone]: telephonic interpretor. Pt states she skips breakfast almost every day.  Preferred Learning Style:   Auditory  Learning Readiness:   Ready Handouts given during visit include:  Pre-Op Goals Bariatric Surgery Protein Shakes During the appointment today the following Pre-Op Goals were reviewed with the patient: Maintain or lose weight as instructed by your surgeon Make healthy food choices Begin to limit portion sizes Limited concentrated sugars and fried foods Keep fat/sugar in the single digits per serving on   food labels Practice CHEWING your food  (aim for 30 chews per bite or until applesauce consistency) Practice not drinking 15 minutes before, during, and 30 minutes after each meal/snack Avoid all carbonated beverages  Avoid/limit caffeinated beverages  Avoid all sugar-sweetened beverages Consume 3 meals per day; eat every 3-5 hours Make a list of non-food related activities Aim for 64-100 ounces of FLUID daily  Aim for at least 60-80 grams of PROTEIN daily Look for a liquid protein source that contain ?15 g protein and ?5 g carbohydrate  (ex: shakes, drinks, shots)  Patient-Centered Goals: 10/10 specific/non-scale and confidence/importance scale 1-10  Demonstrated degree of understanding via:  Teach Back  Teaching Method Utilized:  Visual Auditory Hands on  Barriers to learning/adherence to lifestyle change: Spanish Speaking  Patient to call [facility] to enroll in Pre-Op and Post-Op Nutrition Education when surgery date is scheduled.

## 2015-09-23 ENCOUNTER — Ambulatory Visit: Payer: Self-pay | Admitting: Skilled Nursing Facility1

## 2015-09-25 ENCOUNTER — Ambulatory Visit: Payer: Self-pay | Admitting: Dietician

## 2015-09-25 ENCOUNTER — Encounter: Payer: Self-pay | Admitting: Dietician

## 2015-09-25 ENCOUNTER — Encounter: Payer: BLUE CROSS/BLUE SHIELD | Attending: General Surgery | Admitting: Dietician

## 2015-09-25 DIAGNOSIS — Z01818 Encounter for other preprocedural examination: Secondary | ICD-10-CM | POA: Diagnosis present

## 2015-09-25 NOTE — Patient Instructions (Signed)
-  Start thinking about the foods you will be eating after surgery (for example, eggs and beans with no tortilla)  -Keep drinking mainly water  -Keep working on chewing thoroughly  -Keep avoiding fried foods

## 2015-09-25 NOTE — Progress Notes (Signed)
Supervised Weight Loss:  Appt start time: 200 end time:  215  SWL visit 1:  Primary concerns today: Amber Branch returns today having maintained her weight since last visit. She reports feeling depressed about her weight. She has been avoiding soda and other sweet drinks. Has also been practicing chewing foods 30x per bite. Has been avoiding fried foods. She comes today with questions about the diet after surgery.     Weight: 194.5 lbs BMI: 38.1  [address] Spanish Language Interpreter Amber Branch ID# [phone] [id number]  Patient-Centered Goals: 10/10 specific/non-scale and confidence/importance scale 1-10  MEDICATIONS: see list  DIETARY INTAKE:  24-hr recall:  B (7:30 AM): shake with papaya, oatmeal, and almond milk   Snk ( AM) :  L (11 AM): taco with egg and beans or sausage  Snk ( PM):  D (4 PM): rice, chicken, and vegetables with salsa or soup  Snk ( PM): fruit or another shake   Beverages: water, water with lemon and cucumber  Recent physical activity: walking; limited due to leg pain radiating to hip  Estimated energy needs: 1600-1800 calories  Progress Towards Goal(s):  In progress.   Nutritional Diagnosis:  [place]-3.3 Overweight/obesity related to past poor dietary habits and physical inactivity as evidenced by patient in SWL for pending bariatric surgery following dietary guidelines for continued weight loss.     Intervention:  Nutrition counseling provided. Reviewed post op diet progression using food models.  Handouts given during visit include:  none  Monitoring/Evaluation:  Dietary intake, exercise, and body weight in 4 week(s).

## 2015-10-23 ENCOUNTER — Encounter: Payer: BLUE CROSS/BLUE SHIELD | Attending: General Surgery | Admitting: Skilled Nursing Facility1

## 2015-10-23 ENCOUNTER — Encounter: Payer: Self-pay | Admitting: Skilled Nursing Facility1

## 2015-10-23 DIAGNOSIS — E669 Obesity, unspecified: Secondary | ICD-10-CM

## 2015-10-23 DIAGNOSIS — Z01818 Encounter for other preprocedural examination: Secondary | ICD-10-CM | POA: Insufficient documentation

## 2015-10-23 NOTE — Progress Notes (Signed)
Supervised Weight Loss:  Appt start time: 200 end time:  215  SWL visit 2:  Primary concerns today: Amber Branch returns today having maintained her weight since last visit. She reports feeling depressed about her weight. She has had no soda. Has also been practicing chewing foods 30x per bite. Has been avoiding fried foods but still eats them about once a week. She comes today with questions about the diet after surgery in particular reaffirming the answer she got last week about fruit and vegetables because she states she could not believe it. Pt states she has been drinking one premier protein shake in the morning.    Interpretor: pacific Amber Branch [id number]  Weight: 194.7 lbs BMI: 38.1  Patient-Centered Goals: 10/10 specific/non-scale and confidence/importance scale 1-10  MEDICATIONS: see list  DIETARY INTAKE:  24-hr recall:  B (7:30 AM): shake with papaya, oatmeal, and almond milk   Snk ( AM) :  L (11 AM): taco with egg and beans or sausage  Snk ( PM):  D (4 PM): rice, chicken, and vegetables with salsa or soup  Snk ( PM): fruit or another shake   Beverages: water, water with lemon and cucumber  Recent physical activity: walking; limited due to leg pain radiating to hip  Estimated energy needs: 1600-1800 calories  Progress Towards Goal(s):  In progress.   Nutritional Diagnosis:  [place]-3.3 Overweight/obesity related to past poor dietary habits and physical inactivity as evidenced by patient in SWL for pending bariatric surgery following dietary guidelines for continued weight loss.     Intervention:  Nutrition counseling provided. Reviewed post op diet progression using food models.  Handouts given during visit include:  none  Monitoring/Evaluation:  Dietary intake, exercise, and body weight in 4 week(s).

## 2015-11-27 ENCOUNTER — Encounter: Payer: BLUE CROSS/BLUE SHIELD | Attending: General Surgery | Admitting: Skilled Nursing Facility1

## 2015-11-27 DIAGNOSIS — Z01818 Encounter for other preprocedural examination: Secondary | ICD-10-CM | POA: Insufficient documentation

## 2015-11-27 DIAGNOSIS — E669 Obesity, unspecified: Secondary | ICD-10-CM

## 2015-11-27 NOTE — Progress Notes (Signed)
Supervised Weight Loss:  Appt start time: 200 end time:  215  SWL visit 3:  Primary concerns today: Steward DroneBrenda returns today having lost 1 pound. Pt states Chewing is difficult but she is still trying. Pt states she has been drinking plenty of water and does not drink with meals.  Interpretor: pacific Tobi [place] [phone]  Weight: 193 lbs BMI: 38  Patient-Centered Goals: 10/10 specific/non-scale and confidence/importance scale 1-10  MEDICATIONS: see list  DIETARY INTAKE:  24-hr recall:  B (7:30 AM): shake with papaya, oatmeal, and almond milk   Snk ( AM) :  L (11 AM): taco with egg and beans or sausage  Snk ( PM):  D (4 PM): rice, chicken, and vegetables with salsa or soup  Snk ( PM): fruit or another shake   Beverages: water, water with lemon and cucumber  Recent physical activity: upper body workouts, core workouts, and dancing 4 days a week  Estimated energy needs: 1600-1800 calories  Progress Towards Goal(s):  In progress.   Nutritional Diagnosis:  [place]-3.3 Overweight/obesity related to past poor dietary habits and physical inactivity as evidenced by patient in SWL for pending bariatric surgery following dietary guidelines for continued weight loss.     Intervention:  Nutrition counseling provided. Reviewed post op diet progression using food models.  Handouts given during visit include:  none  Monitoring/Evaluation:  Dietary intake, exercise, and body weight in 4 week(s).

## 2015-12-24 ENCOUNTER — Ambulatory Visit (INDEPENDENT_AMBULATORY_CARE_PROVIDER_SITE_OTHER): Payer: BLUE CROSS/BLUE SHIELD | Admitting: Student

## 2015-12-24 VITALS — BP 113/59 | HR 76 | Temp 98.1°F | Ht 60.0 in | Wt 194.8 lb

## 2015-12-24 DIAGNOSIS — L659 Nonscarring hair loss, unspecified: Secondary | ICD-10-CM

## 2015-12-24 DIAGNOSIS — E781 Pure hyperglyceridemia: Secondary | ICD-10-CM

## 2015-12-24 DIAGNOSIS — R519 Headache, unspecified: Secondary | ICD-10-CM

## 2015-12-24 DIAGNOSIS — R51 Headache: Secondary | ICD-10-CM

## 2015-12-24 DIAGNOSIS — E782 Mixed hyperlipidemia: Secondary | ICD-10-CM | POA: Diagnosis not present

## 2015-12-24 NOTE — Patient Instructions (Addendum)
It was great seeing you today! We have addressed the following issues today  1. Concern about triglyceride: We will check your blood for triglycerides and thyroid levels today.  2. Headache: I recommend frequent small meals with plenty of fluids such as water. I also recommend minimizing the use of pain medication such as Advil. Exercise and good quality sleep could also help with headache.     If we did any lab work today, and the results require attention, either me or my nurse will get in touch with you. If everything is normal, you will get a letter in mail. If you don't hear from us in two weeks, please give us a call. Otherwise, I look forward to talking with you again at our next visit. If you have any questions or concerns before then, please call the clinic at [phone].  Please bring all your medications to every doctors visit   Sign up for My Chart to have easy access to your labs results, and communication with your Primary care physician.    Please check-out at the front desk before leaving the clinic.   Take Care,    Cefaleas de rebote por consumo excesivo de analgsicos (Analgesic Rebound Headaches) Una cefalea de rebote por consumo excesivo de analgsicos es aquella que regresa despus de que desaparece el efecto de un medicamento para Chief Technology Officerel dolor (analgsico) que se tom para tratar Chief Technology Officerel dolor de cabeza inicial. Las personas que sufren tensin, migraas o cefaleas en brote corren riesgo de tener cefaleas de rebote. Cualquier tipo de dolor de cabeza primario puede regresar como una cefalea de rebote si se toman analgsicos regularmente ms de tres veces por semana. Si el ciclo de las cefaleas de rebote [place], estas se convierten en dolores de cabeza diarios crnicos.  CAUSAS Los analgsicos que frecuentemente se asocian con este problema incluyen aquellos comunes de venta Llewellyn Parklibre, como aspirina, ibuprofeno, paracetamol, antisinusticos y otros frmacos que contienen cafena.  Los analgsicos narcticos tambin son Neomia Dearuna causa frecuente de las cefaleas de rebote.  SIGNOS Y SNTOMAS Los sntomas de las cefaleas de rebote son los mismos que los del Production designer, theatre/television/filmprimer dolor de Turkmenistancabeza. Los sntomas de tipos especficos de cefaleas incluyen lo siguiente: Cefalea tensional.  Presin alrededor de la cabeza.  Dolor "sordo" en la cabeza.  Dolor que siente sobre la frente y los lados de la cabeza.  Sensibilidad en los msculos de la cabeza, el cuello y los hombros. Cefalea migraosa  Dolor pulstil e intermitente en uno o ambos lados de la cabeza.  Dolor intenso que dificulta las actividades cotidianas.  Dolor que empeora con la actividad fsica.  Nuseas, vmitos o ambos.  Dolor ante la exposicin a luces brillantes, ruidos fuertes o aromas intensos.  Sensibilidad general a las luces brillantes, a los ruidos fuertes o a los aromas intensos.  Cambios visuales.  Adormecimiento de uno o ConocoPhillipsambos brazos. Cefaleas en brotes  Dolor intenso que comienza en un ojo, alrededor de un ojo o en la sien.  Enrojecimiento del ojo del mismo lado del dolor.  Prpados cados o hinchados.  Dolor en un lado de la cabeza.  Nuseas.  Secrecin nasal.  Piel del rostro sudorosa y plida.  Agitacin. DIAGNSTICO  Las cefaleas de rebote por consumo excesivo de analgsicos se diagnostican mediante la revisin de los antecedentes mdicos que incluye la naturaleza de las cefaleas iniciales as como el tipo de analgsicos que ha estado tomando para tratarlas y la frecuencia con que los toma. TRATAMIENTO Normalmente, la interrupcin  del consumo frecuente del analgsico reducir la frecuencia de los episodios de rebote. Al principio, esto puede [employer] cefaleas, pero, con el tiempo, el dolor debe volverse ms controlable, menos frecuente y [employer]  Consultar a un especialista en cefaleas puede ser de ayuda. Tal vez este profesional pueda ayudarlo a Chief Operating Officer las cefaleas y comprobar  que estas no tengan otra causa. Los mtodos alternativos de alivio del estrs, como la acupuntura, el asesoramiento, la biorregulacin y los masajes tambin pueden ayudar. Hable con el mdico respecto de los tratamientos alternativos que podran ser adecuados para su caso. INSTRUCCIONES PARA EL CUIDADO EN EL HOGAR Suspender el consumo habitual de analgsicos puede ser difcil. Siga cuidadosamente las indicaciones del mdico. Concurra a todas las citas. Evite los factores desencadenantes que se sabe le causan las cefaleas primarias. SOLICITE ATENCIN MDICA SI: Sigue teniendo cefaleas despus de Education officer, environmental los tratamientos que el mdico recomend. SOLICITE ATENCIN MDICA DE INMEDIATO SI:  Tiene nuevas cefaleas.  Tiene cefaleas que son diferentes de las que tuvo en el pasado.  Tiene adormecimiento u hormigueos en los brazos o las piernas.  Tiene cambios en la visin o en el habla. ASEGRESE DE QUE:  Comprende estas instrucciones.  Controlar el estado del [place].  Solicitar ayuda de inmediato si el nio no mejora o si empeora.   Esta informacin no tiene Theme park manager el consejo del mdico. Asegrese de hacerle al mdico cualquier pregunta que tenga.   Document Released: 06/05/2008 Document Revised: 03/30/2014 Elsevier Interactive Patient Education [employer]

## 2015-12-24 NOTE — Assessment & Plan Note (Addendum)
Likely tension headache. Could also be rebound. Recommended minimizing use of NSAIDs. Correlation with meals also suggest postprandial hypoglycemia. Recommended frequent small meals with plenty of water. She also appears to be under stress caring for her 2 young children during the this ecounter. She has no one to care for children.  Ordered TSH but patient refused blood draw on her way out.

## 2015-12-24 NOTE — Progress Notes (Signed)
   Subjective:    Patient ID: Amber Branch is a 35 y.o. old female. Video interpreter with ID number [phone] was used for this encounter  HPI #"High triglyceride": she had lipid panel done at her bariatric clinic (Dr. Andrey CampanileWilson) about three months ago. She was told her triglyceride was high and she was worried about that. She does not remember how high the numbers were. She says Dr. Andrey CampanileWilson has seen the lab results to her PCP. I wasn't able to see those results under media tab. She had history of elevated triglyceride to 630 about 3 years ago. Repeat lab about a week later showed triglyceride of 185.   #Headache: headache usually after eating. She also feels sleepy. Pain is global. She describes the pain as sharp. Pain intensity 7/10 at its worst. No pain now. She uses Advil twice a day for the last one month. Denies vision issues.  Denies weakness. Reports numbness in both arms occassionally.  PMH: reviewed  Review of Systems Per HPI Objective:   Vitals:   12/24/15 1204  BP: (!) 113/59  Pulse: 76  Temp: 98.1 F (36.7 C)  TempSrc: Oral  Weight: 194 lb 12.8 oz (88.4 kg)  Height: 5' (1.524 m)    GEN: appears obese, appears to be under stress caring for her 2 young children during the exam CVS: RRR, normal s1 and s2, no murmurs RESP: no increased work of breathing, good air movement bilaterally, no crackles or wheeze GI: Bowel sounds present and normal, soft, non-tender,non-distended NEURO: alert and oriented appropriately, no gross defecits  PSYCH: continuously distracted by her 2 young children during this encounter.     Assessment & Plan:  Hypertriglyceridemia Order lipid panel today. However, patient declined the blood draw after she left the room. A1c is 5.2 about 6 months ago.  Headache Likely tension headache. Could also be rebound. Recommended minimizing use of NSAIDs. Correlation with meals also suggest postprandial hypoglycemia. Recommended frequent small meals with  plenty of water. She also appears to be under stress caring for her 2 young children during the this ecounter. She has no one to care for children.  Ordered TSH but patient refused blood draw on her way out.

## 2015-12-24 NOTE — Assessment & Plan Note (Signed)
Order lipid panel today. However, patient declined the blood draw after she left the room. A1c is 5.2 about 6 months ago.

## 2015-12-26 ENCOUNTER — Encounter: Payer: BLUE CROSS/BLUE SHIELD | Attending: General Surgery | Admitting: Skilled Nursing Facility1

## 2015-12-26 ENCOUNTER — Encounter: Payer: Self-pay | Admitting: Skilled Nursing Facility1

## 2015-12-26 DIAGNOSIS — Z01818 Encounter for other preprocedural examination: Secondary | ICD-10-CM | POA: Diagnosis present

## 2015-12-26 DIAGNOSIS — E6609 Other obesity due to excess calories: Secondary | ICD-10-CM

## 2015-12-26 NOTE — Progress Notes (Signed)
Supervised Weight Loss:  Appt start time: 200 end time:  215  SWL visit 3:  Primary concerns today: Steward DroneBrenda returns today having gained 3 pounds.Pt states she has been very stressed. Pt states her husband is in the construction business and she is in charge of the paperwork and they are going through an audit right now. Pt states she has not been eating breakfast.  Pt states her 35 year old son is her main support system.  Interpretor: pacific calina [phone]  Weight: 196.6 lbs BMI:   Patient-Centered Goals: 10/10 specific/non-scale and confidence/importance scale 1-10  MEDICATIONS: see list  DIETARY INTAKE:  24-hr recall:  B (7:30 AM): shake with papaya, oatmeal, and almond milk   Snk ( AM) :  L (11 AM):chicken vegetables and rice Snk ( PM):  D (4 PM): rice, chicken, and vegetables with salsa or soup  Snk ( PM): fruit or another shake   Beverages: water, water with lemon and cucumber  Recent physical activity: upper body workouts, core workouts, and dancing 4 days a week  Estimated energy needs: 1600-1800 calories  Progress Towards Goal(s):  In progress.   Nutritional Diagnosis:  [place]-3.3 Overweight/obesity related to past poor dietary habits and physical inactivity as evidenced by patient in SWL for pending bariatric surgery following dietary guidelines for continued weight loss.     Intervention:  Nutrition counseling provided. Reviewed post op diet progression using food models.  Handouts given during visit include:  none  Monitoring/Evaluation:  Dietary intake, exercise, and body weight in 4 week(s).

## 2016-01-23 ENCOUNTER — Encounter: Payer: BLUE CROSS/BLUE SHIELD | Attending: General Surgery | Admitting: Skilled Nursing Facility1

## 2016-01-23 ENCOUNTER — Encounter: Payer: Self-pay | Admitting: Skilled Nursing Facility1

## 2016-01-23 DIAGNOSIS — Z01818 Encounter for other preprocedural examination: Secondary | ICD-10-CM | POA: Insufficient documentation

## 2016-01-23 DIAGNOSIS — E6609 Other obesity due to excess calories: Secondary | ICD-10-CM

## 2016-01-23 NOTE — Progress Notes (Signed)
Supervised Weight Loss:  Appt start time: 200 end time:  215  SWL visit 3:  Primary concerns today: Steward DroneBrenda returns today having maintained her wt. Pt states this is her last appointment of SWL. Pt states she is under less stress than she was before. Pt states she has been following all of the pre-op goals and now finds soda too sweet.     Interpretor: pacific   Weight: 196.6 lbs BMI: 38.40  Patient-Centered Goals: 10/10 specific/non-scale and confidence/importance scale 1-10  MEDICATIONS: see list  DIETARY INTAKE:  24-hr recall:  B (7:30 AM): shake only Snk ( AM) :  L (11 AM):chicken vegetables and rice Snk ( PM):  D (4 PM): rice, chicken, and vegetables with salsa or soup  Snk ( PM): fruit or another shake   Beverages: water, water with lemon and cucumber  Recent physical activity: upper body workouts, core workouts, and dancing 4 days a week  Estimated energy needs: 1600-1800 calories  Progress Towards Goal(s):  In progress.   Nutritional Diagnosis:  [place]-3.3 Overweight/obesity related to past poor dietary habits and physical inactivity as evidenced by patient in SWL for pending bariatric surgery following dietary guidelines for continued weight loss.     Intervention:  Nutrition counseling provided. Reviewed post op diet progression using food models.  Handouts given during visit include:  none  Monitoring/Evaluation:  Dietary intake, exercise, and body weight in 4 week(s).

## 2016-02-20 ENCOUNTER — Encounter (HOSPITAL_BASED_OUTPATIENT_CLINIC_OR_DEPARTMENT_OTHER): Payer: Self-pay

## 2016-02-20 DIAGNOSIS — G4733 Obstructive sleep apnea (adult) (pediatric): Secondary | ICD-10-CM

## 2016-03-11 ENCOUNTER — Encounter (HOSPITAL_BASED_OUTPATIENT_CLINIC_OR_DEPARTMENT_OTHER): Payer: BLUE CROSS/BLUE SHIELD

## 2016-05-22 ENCOUNTER — Emergency Department (HOSPITAL_COMMUNITY): Payer: BLUE CROSS/BLUE SHIELD

## 2016-05-22 ENCOUNTER — Emergency Department (HOSPITAL_COMMUNITY)
Admission: EM | Admit: 2016-05-22 | Discharge: 2016-05-23 | Disposition: A | Discharge status: Home / Self Care | Payer: BLUE CROSS/BLUE SHIELD | Attending: Emergency Medicine | Admitting: Emergency Medicine

## 2016-05-22 ENCOUNTER — Encounter (HOSPITAL_COMMUNITY): Payer: Self-pay | Admitting: Emergency Medicine

## 2016-05-22 DIAGNOSIS — J069 Acute upper respiratory infection, unspecified: Secondary | ICD-10-CM | POA: Insufficient documentation

## 2016-05-22 DIAGNOSIS — Z79899 Other long term (current) drug therapy: Secondary | ICD-10-CM | POA: Insufficient documentation

## 2016-05-22 DIAGNOSIS — J40 Bronchitis, not specified as acute or chronic: Secondary | ICD-10-CM

## 2016-05-22 LAB — I-STAT CG4 LACTIC ACID, ED: Lactic Acid, Venous: 2.5 mmol/L (ref 0.5–1.9)

## 2016-05-22 LAB — CBC WITH DIFFERENTIAL/PLATELET
BASOS ABS: 0 10*3/uL (ref 0.0–0.1)
BASOS PCT: 0 %
Eosinophils Absolute: 0.1 10*3/uL (ref 0.0–0.7)
Eosinophils Relative: 2 %
HEMATOCRIT: 38 % (ref 36.0–46.0)
Hemoglobin: 12.7 g/dL (ref 12.0–15.0)
Lymphocytes Relative: 24 %
Lymphs Abs: 1.3 10*3/uL (ref 0.7–4.0)
MCH: 29.9 pg (ref 26.0–34.0)
MCHC: 33.4 g/dL (ref 30.0–36.0)
MCV: 89.4 fL (ref 78.0–100.0)
Monocytes Absolute: 0.3 10*3/uL (ref 0.1–1.0)
Monocytes Relative: 6 %
NEUTROS ABS: 3.7 10*3/uL (ref 1.7–7.7)
Neutrophils Relative %: 68 %
Platelets: 200 10*3/uL (ref 150–400)
RBC: 4.25 MIL/uL (ref 3.87–5.11)
RDW: 14.4 % (ref 11.5–15.5)
WBC: 5.5 10*3/uL (ref 4.0–10.5)

## 2016-05-22 LAB — URINALYSIS, ROUTINE W REFLEX MICROSCOPIC
Bilirubin Urine: NEGATIVE
Glucose, UA: NEGATIVE mg/dL
Hgb urine dipstick: NEGATIVE
Ketones, ur: NEGATIVE mg/dL
LEUKOCYTES UA: NEGATIVE
NITRITE: NEGATIVE
Protein, ur: NEGATIVE mg/dL
Specific Gravity, Urine: 1.011 (ref 1.005–1.030)
pH: 6 (ref 5.0–8.0)

## 2016-05-22 LAB — COMPREHENSIVE METABOLIC PANEL
ALT: 49 U/L (ref 14–54)
ANION GAP: 9 (ref 5–15)
AST: 47 U/L — ABNORMAL HIGH (ref 15–41)
Albumin: 4 g/dL (ref 3.5–5.0)
Alkaline Phosphatase: 64 U/L (ref 38–126)
BUN: 7 mg/dL (ref 6–20)
CO2: 26 mmol/L (ref 22–32)
Calcium: 9.1 mg/dL (ref 8.9–10.3)
Chloride: 100 mmol/L — ABNORMAL LOW (ref 101–111)
Creatinine, Ser: 0.48 mg/dL (ref 0.44–1.00)
GFR calc Af Amer: >60 mL/min (ref >60)
Glucose, Bld: 120 mg/dL — ABNORMAL HIGH (ref 65–99)
Potassium: 4.2 mmol/L (ref 3.5–5.1)
Sodium: 135 mmol/L (ref 135–145)
TOTAL PROTEIN: 6.8 g/dL (ref 6.5–8.1)
Total Bilirubin: 0.6 mg/dL (ref 0.3–1.2)

## 2016-05-22 LAB — POC URINE PREG, ED: PREG TEST UR: NEGATIVE

## 2016-05-22 MED ORDER — SODIUM CHLORIDE 0.9 % IV BOLUS (SEPSIS)
2000.0000 mL | Freq: Once | INTRAVENOUS | Status: AC
  Administered 2016-05-23: 2000 mL via INTRAVENOUS

## 2016-05-22 NOTE — ED Triage Notes (Signed)
Pt to ED c/o flu-like symptoms (R ear pain, sore throat, fevers/chills, cough, congestion, runny nose, and headache) x 3 days. Pt reports taking Tylenol without relief.

## 2016-05-23 LAB — I-STAT CG4 LACTIC ACID, ED: Lactic Acid, Venous: 2.14 mmol/L (ref 0.5–1.9)

## 2016-05-23 MED ORDER — ACETAMINOPHEN 500 MG PO TABS
1000.0000 mg | ORAL_TABLET | Freq: Once | ORAL | Status: AC
  Administered 2016-05-23: 1000 mg via ORAL
  Filled 2016-05-23: qty 2

## 2016-05-23 MED ORDER — OSELTAMIVIR PHOSPHATE 75 MG PO CAPS: 75.0000 mg | ORAL_CAPSULE | Freq: Two times a day (BID) | ORAL | 0 refills | Status: DC

## 2016-05-23 MED ORDER — GUAIFENESIN-CODEINE 100-10 MG/5ML PO SOLN: 5.0000 mL | Freq: Four times a day (QID) | ORAL | 0 refills | Status: DC | PRN

## 2016-05-23 NOTE — Discharge Instructions (Signed)
I am treating you for influenza. Please take the medication as I have prescribed them. Alternate between tylenol and advil every 3 hours for your fever. Do not drive while taking the cough medicine. Follow these instructions at home: Take over-the-counter and prescription medicines only as told by your health care provider. Use a cool mist humidifier to add humidity to the air in your home. This can make breathing easier. Rest as needed. Drink enough fluid to keep your urine clear or pale yellow. Cover your mouth and nose when you cough or sneeze. Wash your hands with soap and water often, especially after you cough or sneeze. If soap and water are not available, use hand sanitizer. Stay home from work or school as told by your health care provider. Unless you are visiting your health care provider, try to avoid leaving home until your fever has been gone for 24 hours without the use of medicine. Keep all follow-up visits as told by your health care provider. This is important. Contact a health care provider if: You develop new symptoms. You have: Chest pain. Diarrhea. A fever. Your cough gets worse. You produce more mucus. You feel nauseous or you vomit. Get help right away if: You develop shortness of breath or difficulty breathing. Your skin or nails turn a bluish color. You have severe pain or stiffness in your neck. You develop a sudden headache or sudden pain in your face or ear. You cannot stop vomiting.

## 2016-05-23 NOTE — ED Notes (Signed)
Pt departed in NAD.  

## 2016-05-23 NOTE — ED Notes (Signed)
Pt given water & sandwich meal.

## 2016-05-23 NOTE — ED Provider Notes (Signed)
[facility]-EMERGENCY DEPT Provider Note   CSN: [id number] Arrival date & time: 05/22/16  2202     History   Chief Complaint Chief Complaint  Patient presents with  . Cough  . Fever    HPI Amber Branch is a 36 y.o. female who presents with cc of flu-like sxs. There is a language barrier and translation is used.     Amber Branch is a 36 y.o. female who presents for evaluation of influenza like symptoms. Symptoms include chest congestion, chest pain during cough, chills, headache, myalgias and productive cough and have been present for 4 days. She has tried to alleviate the symptoms with acetaminophen with minimal relief. High risk factors for influenza complications: none. Her daughter has had the same sxs this week but is improving.      Influenza  Presenting symptoms: cough, fatigue, headache and myalgias   Presenting symptoms: no diarrhea, no fever, no nausea, no rhinorrhea, no shortness of breath and no sore throat   Cough:    Cough characteristics:  Productive   Sputum characteristics:  Nondescript   Severity:  Moderate Severity:  Moderate Onset quality:  Gradual Duration:  4 days Progression:  Unchanged Associated symptoms: chills, decreased appetite and decreased physical activity   Associated symptoms: no ear pain, no mental status change, no congestion, no neck stiffness and no syncope     Past Medical History:  Diagnosis Date  . Gestational diabetes   . History of multiple spontaneous abortions   . No pertinent past medical history     Patient Active Problem List   Diagnosis Date Noted  . Headache 12/24/2015  . Right leg pain 06/24/2015  . Bariatric surgery status 06/24/2015  . Vaginal delivery 03/20/2014  . Status post tubal ligation at time of delivery, current hospitalization 03/20/2014  . Labor, precipitous 03/19/2014  . Maternal morbid obesity in third trimester, antepartum (HCC)   . Fetal macrosomia   . Right tennis elbow 06/20/2013  .  Insomnia 06/20/2013  . Dysthymia 04/28/2013  . 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 Low back pain radiating to left leg 12/09/2012  . Hypertriglyceridemia 01/09/2011    Past Surgical History:  Procedure Laterality Date  . NO PAST SURGERIES    . TUBAL LIGATION Bilateral 03/19/2014   Procedure: POST PARTUM TUBAL LIGATION;  Surgeon: Levie Heritage, DO;  Location: [facility];  Service: Gynecology;  Laterality: Bilateral;    OB History    Gravida Para Term Preterm AB Living   9 5 5   4 5    SAB TAB Ectopic Multiple Live Births   4     0 5       Home Medications    Prior to Admission medications   Medication Sig Start Date End Date Taking? Authorizing Provider  ibuprofen (ADVIL,MOTRIN) 600 MG tablet Take 1 tablet (600 mg total) by mouth every 6 (six) hours as needed for cramping. 03/20/14   Dorathy Kinsman, CNM  oxyCODONE-acetaminophen (PERCOCET/ROXICET) 5-325 MG per tablet Take 1-2 tablets by mouth every 4 (four) hours as needed (for pain scale less than 7). Patient not taking: Reported on 04/18/2014 03/20/14   Dorathy Kinsman, CNM  Prenatal Vit-Fe Fumarate-FA (PRENATAL MULTIVITAMIN) TABS tablet Take 1 tablet by mouth at bedtime. 10/02/13   Adam Phenix, MD    Family History Family History  Problem Relation Age of Onset  . Diabetes Mother   . Anesthesia problems Neg Hx   . Hypotension Neg Hx   . Malignant hyperthermia Neg Hx   .  Pseudochol deficiency Neg Hx   . Other Neg Hx   . Alcohol abuse Neg Hx   . Arthritis Neg Hx   . Asthma Neg Hx   . Birth defects Neg Hx   . Cancer Neg Hx   . COPD Neg Hx   . Depression Neg Hx   . Drug abuse Neg Hx   . Early death Neg Hx   . Hearing loss Neg Hx   . Heart disease Neg Hx   . Hyperlipidemia Neg Hx   . Hypertension Neg Hx   . Kidney disease Neg Hx   . Learning disabilities Neg Hx   . Mental illness Neg Hx   . Mental retardation Neg Hx   . Miscarriages / Stillbirths Neg Hx   . Stroke Neg Hx   . Vision loss Neg Hx     Social  History Social History  Substance Use Topics  . Smoking status: Never Smoker  . Smokeless tobacco: Never Used  . Alcohol use No     Allergies   Codeine   Review of Systems Review of Systems  Constitutional: Positive for chills, decreased appetite and fatigue. Negative for fever.  HENT: Negative for congestion, ear pain, rhinorrhea and sore throat.   Respiratory: Positive for cough. Negative for shortness of breath.   Gastrointestinal: Negative for diarrhea and nausea.  Musculoskeletal: Positive for myalgias. Negative for neck stiffness.  Neurological: Positive for headaches.  Ten systems reviewed and are negative for acute change, except as noted in the HPI.     Physical Exam Updated Vital Signs BP 122/80 (BP Location: Left Arm)   Pulse 110   Temp (S) 101.8 F (38.8 C) (Oral)   Resp 22   LMP 05/03/2016 (Exact Date)   SpO2 100%   Physical Exam  Constitutional: She is oriented to person, place, and time. She appears well-developed and well-nourished. She appears ill. No distress.  HENT:  Head: Normocephalic and atraumatic.  Eyes: Conjunctivae and EOM are normal. Pupils are equal, round, and reactive to light. No scleral icterus.  Neck: Normal range of motion.  Cardiovascular: Normal rate, regular rhythm and normal heart sounds.  Exam reveals no gallop and no friction rub.   No murmur heard. Pulmonary/Chest: Effort normal and breath sounds normal. No respiratory distress.  Abdominal: Soft. Bowel sounds are normal. She exhibits no distension and no mass. There is no tenderness. There is no guarding.  Neurological: She is alert and oriented to person, place, and time.  Skin: Skin is warm and dry. She is not diaphoretic.  Nursing note and vitals reviewed.    ED Treatments / Results  Labs (all labs ordered are listed, but only abnormal results are displayed) Labs Reviewed  COMPREHENSIVE METABOLIC PANEL - Abnormal; Notable for the following:       Result Value    Chloride 100 (*)    Glucose, Bld 120 (*)    AST 47 (*)    All other components within normal limits  URINALYSIS, ROUTINE W REFLEX MICROSCOPIC - Abnormal; Notable for the following:    Color, Urine STRAW (*)    All other components within normal limits  I-STAT CG4 LACTIC ACID, ED - Abnormal; Notable for the following:    Lactic Acid, Venous 2.50 (*)    All other components within normal limits  CBC WITH DIFFERENTIAL/PLATELET  POC URINE PREG, ED    EKG  EKG Interpretation None       Radiology Dg Chest 2 View  Result Date: 05/22/2016  CLINICAL DATA:  Flu like symptoms, sore throat, fever and chills, cough and right ear pain. EXAM: CHEST  2 VIEW COMPARISON:  07/31/2015 FINDINGS: The heart size and mediastinal contours are within normal limits. Both lungs are clear. Mild bilateral interstitial prominence is noted which may reflect bronchitic change. The visualized skeletal structures are unremarkable. IMPRESSION: Mild interstitial prominence bilaterally which may reflect bronchitic change. Electronically Signed   By: Tollie Eth M.D.   On: 05/22/2016 23:03    Procedures Procedures (including critical care time)  Medications Ordered in ED Medications  sodium chloride 0.9 % bolus 2,000 mL (2,000 mLs Intravenous New Bag/Given 05/23/16 0006)     Initial Impression / Assessment and Plan / ED Course  I have reviewed the triage vital signs and the nursing notes.  Pertinent labs & imaging results that were available during my care of the patient were reviewed by me and considered in my medical decision making (see chart for details).     The patient with symptoms of URI or influenza-like illness. Patient appears well. She appears as if she has a URI. Lactic acid. Orders placed prior to my evaluation and had I had initial decision on orders, I would not have placed lactic acid is on this patient as she is very well-appearing with a fever and tachycardia. She does not have any overt source of  bacterial infection, no pneumonia, no urinary tract infection. Her lactic acid is trending downward. Discussed the case with Dr. Judd Lien who feels patient is safe for discharge.  Final Clinical Impressions(s) / ED Diagnoses   Final diagnoses:  None    New Prescriptions New Prescriptions   No medications on file     Arthor Captain, PA-C 05/23/16 1610    Geoffery Lyons, MD 05/24/16 [phone]

## 2016-05-23 NOTE — ED Notes (Signed)
ED Provider at bedside. 

## 2016-06-09 ENCOUNTER — Other Ambulatory Visit: Payer: Self-pay | Admitting: Family Medicine

## 2018-02-21 ENCOUNTER — Other Ambulatory Visit: Payer: Self-pay

## 2018-02-21 ENCOUNTER — Encounter (HOSPITAL_COMMUNITY): Payer: Self-pay

## 2018-02-21 ENCOUNTER — Ambulatory Visit (HOSPITAL_COMMUNITY)
Admission: EM | Admit: 2018-02-21 | Discharge: 2018-02-21 | Disposition: A | Discharge status: Home / Self Care | Payer: Self-pay | Attending: Family Medicine | Admitting: Family Medicine

## 2018-02-21 ENCOUNTER — Ambulatory Visit (INDEPENDENT_AMBULATORY_CARE_PROVIDER_SITE_OTHER): Payer: Self-pay

## 2018-02-21 DIAGNOSIS — M25561 Pain in right knee: Secondary | ICD-10-CM

## 2018-02-21 MED ORDER — IBUPROFEN 800 MG PO TABS: 800.0000 mg | ORAL_TABLET | Freq: Three times a day (TID) | ORAL | 0 refills | Status: DC | PRN

## 2018-02-21 NOTE — Discharge Instructions (Signed)
Take ibuprofen three times a day with food Do knee exercises See your PCP if you fail to improve

## 2018-02-21 NOTE — ED Triage Notes (Signed)
Pt cc she has pain in her right knee for 1 month now.

## 2018-02-21 NOTE — ED Notes (Signed)
Bed: [facility]06 Expected date:  Expected time:  Means of arrival:  Comments: Hold for 3 pm appointment please

## 2018-02-21 NOTE — ED Provider Notes (Signed)
[facility]    CSN: [id number] Arrival date & time: 02/21/18  1508     History   Chief Complaint Chief Complaint  Patient presents with  . Appointment    300 pm  . Knee Pain    HPI Amber Branch is a 37 y.o. female.   HPI  Patient is seen with the assistance of a Engineer, structural. She has had knee pain on the right for about 1 month.  No trauma.  No accident.  No injury.  No new exercise program. She states anytime she has prolonged standing or walking she has pain around the inside of her knee.  She has pain going up and down stairs.  She cannot squat.  No buckling or locking.  No crepitus or noise.  No swelling or effusion.  No warmth.  No history of arthritis.  She is taken Tylenol with no effect.  Past Medical History:  Diagnosis Date  . Gestational diabetes   . History of multiple spontaneous abortions   . No pertinent past medical history     Patient Active Problem List   Diagnosis Date Noted  . Headache 12/24/2015  . Right leg pain 06/24/2015  . Bariatric surgery status 06/24/2015  . Vaginal delivery 03/20/2014  . Status post tubal ligation at time of delivery, current hospitalization 03/20/2014  . Labor, precipitous 03/19/2014  . Maternal morbid obesity in third trimester, antepartum (HCC)   . Fetal macrosomia   . Right tennis elbow 06/20/2013  . Insomnia 06/20/2013  . Dysthymia 04/28/2013  . 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 Low back pain radiating to left leg 12/09/2012  . Hypertriglyceridemia 01/09/2011    Past Surgical History:  Procedure Laterality Date  . NO PAST SURGERIES    . TUBAL LIGATION Bilateral 03/19/2014   Procedure: POST PARTUM TUBAL LIGATION;  Surgeon: Levie Heritage, DO;  Location: [facility];  Service: Gynecology;  Laterality: Bilateral;    OB History    Gravida  9   Para  5   Term  5   Preterm      AB  4   Living  5     SAB  4   TAB      Ectopic      Multiple  0   Live Births  5             Home Medications    Prior to Admission medications   Medication Sig Start Date End Date Taking? Authorizing Provider  ibuprofen (ADVIL,MOTRIN) 800 MG tablet Take 1 tablet (800 mg total) by mouth every 8 (eight) hours as needed for moderate pain. 02/21/18   Eustace Moore, MD    Family History Family History  Problem Relation Age of Onset  . Diabetes Mother   . Anesthesia problems Neg Hx   . Hypotension Neg Hx   . Malignant hyperthermia Neg Hx   . Pseudochol deficiency Neg Hx   . Other Neg Hx   . Alcohol abuse Neg Hx   . Arthritis Neg Hx   . Asthma Neg Hx   . Birth defects Neg Hx   . Cancer Neg Hx   . COPD Neg Hx   . Depression Neg Hx   . Drug abuse Neg Hx   . Early death Neg Hx   . Hearing loss Neg Hx   . Heart disease Neg Hx   . Hyperlipidemia Neg Hx   . Hypertension Neg Hx   . Kidney disease Neg Hx   .  Learning disabilities Neg Hx   . Mental illness Neg Hx   . Mental retardation Neg Hx   . Miscarriages / Stillbirths Neg Hx   . Stroke Neg Hx   . Vision loss Neg Hx     Social History Social History   Tobacco Use  . Smoking status: Never Smoker  . Smokeless tobacco: Never Used  Substance Use Topics  . Alcohol use: No  . Drug use: No     Allergies   Codeine   Review of Systems Review of Systems  Constitutional: Negative for chills and fever.  HENT: Negative for ear pain and sore throat.   Eyes: Negative for pain and visual disturbance.  Respiratory: Negative for cough and shortness of breath.   Cardiovascular: Negative for chest pain and palpitations.  Gastrointestinal: Negative for abdominal pain and vomiting.  Genitourinary: Negative for dysuria and hematuria.  Musculoskeletal: Positive for arthralgias and gait problem. Negative for back pain.  Skin: Negative for color change and rash.  Neurological: Negative for seizures and syncope.  All other systems reviewed and are negative.    Physical Exam Triage Vital Signs ED Triage Vitals   Enc Vitals Group     BP 02/21/18 1546 112/71     Pulse Rate 02/21/18 1546 86     Resp 02/21/18 1546 16     Temp 02/21/18 1546 97.7 F (36.5 C)     Temp Source 02/21/18 1546 Oral     SpO2 02/21/18 1546 100 %     Weight 02/21/18 1545 173 lb (78.5 kg)     Height --      Head Circumference --      Peak Flow --      Pain Score 02/21/18 1545 7     Pain Loc --      Pain Edu? --      Excl. in GC? --    No data found.  Updated Vital Signs BP 112/71 (BP Location: Left Arm)   Pulse 86   Temp 97.7 F (36.5 C) (Oral)   Resp 16   Wt 78.5 kg   LMP 01/26/2018   SpO2 100%   BMI 33.79 kg/m       Physical Exam  Constitutional: She appears well-developed and well-nourished. No distress.  HENT:  Head: Normocephalic and atraumatic.  Mouth/Throat: Oropharynx is clear and moist.  Eyes: Pupils are equal, round, and reactive to light. Conjunctivae are normal.  Neck: Normal range of motion.  Cardiovascular: Normal rate.  Pulmonary/Chest: Effort normal. No respiratory distress.  Abdominal: Soft. She exhibits no distension.  Musculoskeletal: Normal range of motion. She exhibits no edema.  Tenderness of the medial joint line.  Full range of motion. No instability.  No patellofemoral grind testing.  No effusion.  No warmth.  X-rays are negative.  Neurological: She is alert.  Skin: Skin is warm and dry.  Psychiatric: She has a normal mood and affect. Her behavior is normal.     UC Treatments / Results  Labs (all labs ordered are listed, but only abnormal results are displayed) Labs Reviewed - No data to display  EKG None  Radiology Dg Knee Ap/lat W/sunrise Right  Result Date: 02/21/2018 CLINICAL DATA:  Medial knee pain for 2 months, no known injury, initial encounter EXAM: RIGHT KNEE 3 VIEWS COMPARISON:  None. FINDINGS: No evidence of fracture, dislocation, or joint effusion. No evidence of arthropathy or other focal bone abnormality. Soft tissues are unremarkable. IMPRESSION: No  acute abnormality noted. Electronically Signed  By: Alcide Clever M.D.   On: 02/21/2018 16:40    Procedures Procedures (including critical care time)  Medications Ordered in UC Medications - No data to display  Initial Impression / Assessment and Plan / UC Course  I have reviewed the triage vital signs and the nursing notes.  Pertinent labs & imaging results that were available during my care of the patient were reviewed by me and considered in my medical decision making (see chart for details).    Arthralgia of joint unclear etiology.  Possibly early arthritis.  Will treat with ibuprofen, rest, ice.  Add exercises as tolerated.  Follow-up with PCP if not improved Final Clinical Impressions(s) / UC Diagnoses   Final diagnoses:  Acute pain of right knee     Discharge Instructions     Take ibuprofen three times a day with food Do knee exercises See your PCP if you fail to improve   ED Prescriptions    Medication Sig Dispense Auth. Provider   ibuprofen (ADVIL,MOTRIN) 800 MG tablet Take 1 tablet (800 mg total) by mouth every 8 (eight) hours as needed for moderate pain. 90 tablet Eustace Moore, MD     Controlled Substance Prescriptions Ong Controlled Substance Registry consulted? Not Applicable   Eustace Moore, MD 02/21/18 [id number]

## 2019-03-16 ENCOUNTER — Ambulatory Visit (HOSPITAL_COMMUNITY)
Admission: EM | Admit: 2019-03-16 | Discharge: 2019-03-16 | Disposition: A | Discharge status: Home / Self Care | Payer: Self-pay | Attending: Urgent Care | Admitting: Urgent Care

## 2019-03-16 ENCOUNTER — Other Ambulatory Visit: Payer: Self-pay

## 2019-03-16 ENCOUNTER — Encounter (HOSPITAL_COMMUNITY): Payer: Self-pay | Admitting: Emergency Medicine

## 2019-03-16 DIAGNOSIS — R519 Headache, unspecified: Secondary | ICD-10-CM

## 2019-03-16 DIAGNOSIS — R05 Cough: Secondary | ICD-10-CM

## 2019-03-16 DIAGNOSIS — R058 Other specified cough: Secondary | ICD-10-CM

## 2019-03-16 DIAGNOSIS — R0789 Other chest pain: Secondary | ICD-10-CM

## 2019-03-16 DIAGNOSIS — R0602 Shortness of breath: Secondary | ICD-10-CM

## 2019-03-16 DIAGNOSIS — J069 Acute upper respiratory infection, unspecified: Secondary | ICD-10-CM

## 2019-03-16 DIAGNOSIS — B9789 Other viral agents as the cause of diseases classified elsewhere: Secondary | ICD-10-CM

## 2019-03-16 DIAGNOSIS — M549 Dorsalgia, unspecified: Secondary | ICD-10-CM

## 2019-03-16 MED ORDER — PREDNISONE 20 MG PO TABS: ORAL_TABLET | ORAL | 0 refills | Status: DC

## 2019-03-16 MED ORDER — BENZONATATE 100 MG PO CAPS: 100.0000 mg | ORAL_CAPSULE | Freq: Three times a day (TID) | ORAL | 0 refills | Status: DC | PRN

## 2019-03-16 MED ORDER — PROMETHAZINE-DM 6.25-15 MG/5ML PO SYRP: 5.0000 mL | ORAL_SOLUTION | Freq: Every evening | ORAL | 0 refills | Status: DC | PRN

## 2019-03-16 NOTE — ED Provider Notes (Signed)
[facility]   MRN: [id number] DOB: Nov 13, 1980  Subjective:   Murna Backer is a 38 y.o. female presenting for 5 day history of acute onset moderate worsening productive cough that elicits shob, mid-sternal chest pain, back pain and head pain. Has tried guaifenesin, aspirin with minimal relief. Patient tested negative for both rapid and pcr COVID tests. Denies smoking cigarettes. Denies hx of asthma.   No current facility-administered medications for this encounter.  Current Outpatient Medications:  .  ibuprofen (ADVIL,MOTRIN) 800 MG tablet, Take 1 tablet (800 mg total) by mouth every 8 (eight) hours as needed for moderate pain., Disp: 90 tablet, Rfl: 0   Allergies  Allergen Reactions  . Codeine Rash    Can take tylenol with codeine and percocet    Past Medical History:  Diagnosis Date  . Gestational diabetes   . History of multiple spontaneous abortions   . No pertinent past medical history      Past Surgical History:  Procedure Laterality Date  . NO PAST SURGERIES    . TUBAL LIGATION Bilateral 03/19/2014   Procedure: POST PARTUM TUBAL LIGATION;  Surgeon: Levie Heritage, DO;  Location: [facility];  Service: Gynecology;  Laterality: Bilateral;    Family History  Problem Relation Age of Onset  . Diabetes Mother   . Anesthesia problems Neg Hx   . Hypotension Neg Hx   . Malignant hyperthermia Neg Hx   . Pseudochol deficiency Neg Hx   . Other Neg Hx   . Alcohol abuse Neg Hx   . Arthritis Neg Hx   . Asthma Neg Hx   . Birth defects Neg Hx   . Cancer Neg Hx   . COPD Neg Hx   . Depression Neg Hx   . Drug abuse Neg Hx   . Early death Neg Hx   . Hearing loss Neg Hx   . Heart disease Neg Hx   . Hyperlipidemia Neg Hx   . Hypertension Neg Hx   . Kidney disease Neg Hx   . Learning disabilities Neg Hx   . Mental illness Neg Hx   . Mental retardation Neg Hx   . Miscarriages / Stillbirths Neg Hx   . Stroke Neg Hx   . Vision loss Neg Hx     Social History    Tobacco Use  . Smoking status: Never Smoker  . Smokeless tobacco: Never Used  Substance Use Topics  . Alcohol use: No  . Drug use: No    Review of Systems  Constitutional: Positive for malaise/fatigue. Negative for fever.  HENT: Negative for congestion, ear pain, sinus pain and sore throat.   Eyes: Negative for discharge and redness.  Respiratory: Positive for cough and shortness of breath. Negative for hemoptysis and wheezing.   Cardiovascular: Positive for chest pain.  Gastrointestinal: Negative for abdominal pain, diarrhea, nausea and vomiting.  Genitourinary: Negative for dysuria, flank pain and hematuria.  Musculoskeletal: Negative for myalgias.  Skin: Negative for rash.  Neurological: Negative for dizziness, weakness and headaches.  Psychiatric/Behavioral: Negative for depression and substance abuse.     Objective:   Vitals: BP (!) 141/99 (BP Location: Right Arm)   Pulse 96   Temp 98.7 F (37.1 C) (Oral)   Resp (!) 22   LMP 03/15/2019   SpO2 96%   Physical Exam Constitutional:      General: She is not in acute distress.    Appearance: Normal appearance. She is well-developed. She is not ill-appearing, toxic-appearing or diaphoretic.  HENT:  Head: Normocephalic and atraumatic.     Right Ear: Tympanic membrane and ear canal normal. No drainage or tenderness. No middle ear effusion. Tympanic membrane is not erythematous.     Left Ear: Tympanic membrane and ear canal normal. No drainage or tenderness.  No middle ear effusion. Tympanic membrane is not erythematous.     Nose: Nose normal. No congestion or rhinorrhea.     Mouth/Throat:     Mouth: Mucous membranes are moist. No oral lesions.     Pharynx: Oropharynx is clear. No pharyngeal swelling, oropharyngeal exudate, posterior oropharyngeal erythema or uvula swelling.     Tonsils: No tonsillar exudate or tonsillar abscesses.  Eyes:     Extraocular Movements: Extraocular movements intact.     Right eye: Normal  extraocular motion.     Left eye: Normal extraocular motion.     Conjunctiva/sclera: Conjunctivae normal.     Pupils: Pupils are equal, round, and reactive to light.  Cardiovascular:     Rate and Rhythm: Normal rate and regular rhythm.     Pulses: Normal pulses.     Heart sounds: Normal heart sounds. No murmur. No friction rub. No gallop.   Pulmonary:     Effort: Pulmonary effort is normal. No respiratory distress.     Breath sounds: Normal breath sounds. No stridor. No wheezing, rhonchi or rales.  Musculoskeletal:     Cervical back: Normal range of motion and neck supple.  Lymphadenopathy:     Cervical: No cervical adenopathy.  Skin:    General: Skin is warm and dry.     Findings: No rash.  Neurological:     General: No focal deficit present.     Mental Status: She is alert and oriented to person, place, and time.  Psychiatric:        Mood and Affect: Mood normal.        Behavior: Behavior normal.        Thought Content: Thought content normal.     Assessment and Plan :   1. Productive cough   2. Atypical chest pain   3. Shortness of breath   4. Upper back pain   5. Nonintractable headache, unspecified chronicity pattern, unspecified headache type   6. Viral URI with cough     Will use prednisone course, cough suppression medications for viral URI, bronchitis, severe cough.  Physical exam findings reassuring and therefore will hold off on obtaining chest x-ray.  Patient had COVID-19 testing already through the health department and therefore does not warrant a recheck on this. Counseled patient on potential for adverse effects with medications prescribed/recommended today, ER and return-to-clinic precautions discussed, patient verbalized understanding.    Jaynee Eagles, PA-C 03/16/19 1144

## 2019-03-16 NOTE — ED Triage Notes (Signed)
Pt here for cold sx onset 5 days associated w/prod cough, chest pain d/t cough, headache, SOB when coughing  Had COVID testing done on Tuesday and resulted negative today  A&O x4... NAD.Marland Kitchen. ambulatory

## 2019-03-16 NOTE — Discharge Instructions (Addendum)
Para el dolor de garganta o tos puede usar un t de miel. Use 3 cucharaditas de miel con jugo exprimido de [employer]. Coloque trozos de [name] en 1/2-1 taza de agua y caliente sobre la estufa. Luego mezcle los ingredientes y repita cada 4 horas. Tome Tylenol 500mg -650mg  cada 6 horas para fiebre, dolor de cuerpo/cabeza/pecho. Hidrata muy bien con al menos 2 litros de agua al dia. Coma comidas ligeras como sopas para [employer] electrolitos y coma frutas, verduras. Comience un antihistamnico como Zyrtec (cetirizina) 10mg  al dia. Puede usar pseudoefedrina (Sudafed) de venta libre para el goteo posnasal, congestin a una dosis de 60 mg cada 8 horas o cada 12 horas. Use el jarabe por la noche para su tos y las capsulas durante el dia.

## 2019-05-03 ENCOUNTER — Ambulatory Visit (INDEPENDENT_AMBULATORY_CARE_PROVIDER_SITE_OTHER): Payer: Self-pay | Admitting: Family Medicine

## 2019-05-03 ENCOUNTER — Other Ambulatory Visit: Payer: Self-pay

## 2019-05-03 ENCOUNTER — Encounter: Payer: Self-pay | Admitting: Family Medicine

## 2019-05-03 ENCOUNTER — Other Ambulatory Visit (HOSPITAL_COMMUNITY)
Admission: RE | Admit: 2019-05-03 | Discharge: 2019-05-03 | Disposition: A | Discharge status: Home / Self Care | Payer: Self-pay | Source: Ambulatory Visit | Attending: Family Medicine | Admitting: Family Medicine

## 2019-05-03 VITALS — BP 124/60 | HR 92 | Wt 196.6 lb

## 2019-05-03 DIAGNOSIS — Z01419 Encounter for gynecological examination (general) (routine) without abnormal findings: Secondary | ICD-10-CM | POA: Insufficient documentation

## 2019-05-03 NOTE — Patient Instructions (Signed)
Thank you for coming to see me today. It was a pleasure! Today we talked about:   We will call you with your results or send a letter if they are normal.   I do not feel anything worrisome going on with your breast.  Please continue to take Tylenol and ibuprofen as needed for the pain.  If your pain continues in the next few weeks then do not hesitate to call our office and we will schedule an ultrasound at that time as discussed.  Please follow-up with me in 1 year or sooner as needed.  If you have any questions or concerns, please do not hesitate to call the office at [phone].  Take Care,   Amber Laynie Espy, DO  Cuidados preventivos entre os 21-39 anos, mulheres Preventive Care 8-12 Years Old, Female Cuidados preventivos se referem a consultas com seu mdico e a escolhas de estilo de vida que podem promover sade e bem-estar. Isso inclui:  Um exame fsico anual. Tambm  chamado check-up anual.  Consultas regulares ao dentista e exames oftalmolgicos.  Imunizaes.  Triagem para certas doenas.  Escolhas saudveis de estilo de vida, como manter uma dieta saudvel, fazer exerccios regularmente, no usar drogas ou produtos que contenham nicotina e tabaco e limitar o consumo de lcool. O que posso esperar de minhas consultas preventivas? Exame fsico Seu mdico verificar sua:  Altura e peso. Isso pode ser usado para calcular o IMC (ndice de massa corporal), que diz se voc est com um peso saudvel.  Corky Sox cardaca e presso arterial.  Pele, para ver se h manchas com alteraes. Aconselhamento Seu mdico tambm poder fazer perguntas sobre:  O uso de tabaco, lcool e drogas.  O bem-estar emocional.  O bem-estar em casa e nos relacionamentos.  A atividade sexual.  Os hbitos alimentares.  O trabalho e o ambiente de trabalho.  O mtodo anticoncepcional.  O ciclo menstrual.  O histrico de gravidez. Quais imunizaes eu preciso tomar?  Vacina  contra influenza (gripe)  Essa vacina  recomendada todos os anos. Vacina contra ttano, difteria e coqueluche (vacina Tdap)  Voc poder precisar de um reforo da Td a cada 10 anos. Vacina contra catapora (varicela)  Voc poder precisar dessa vacina se ainda no tiver sido vacinada. Vacina contra o papilomavrus humano (HPV)  Se recomendado pelo seu mdico, voc poder precisar de trs doses ao longo de 6 meses. Vacina contra sarampo, rubola e caxumba (MMR)  Voc poder precisar de pelo menos uma dose da MMR. Voc tambm poder precisar de uma segunda dose. Vacina meningoccica conjugada (MenACWY)  Uma dose  recomendada caso voc tenha 19-21 anos de idade e seja estudante universitrio de primeiro ano vivendo em repblica ou caso apresente um de diversos quadros clnicos. Voc tambm poder precisar de doses de reforo adicionais. Vacina conjugada pneumoccica (PCV13)  Voc poder precisar dessa vacina se sofrer de certos quadros clnicos e ainda no tiver [employer]. Vacina pneumoccica polissacardica (PPSV23)  Voc poder precisar de uma ou duas doses caso fume cigarros ou sofra de certos quadros clnicos. Vacina contra hepatite A  Voc poder precisar dessa vacina se apresentar certos quadros clnicos ou viajar para ou trabalhar em lugares nos quais pode ser exposto  hepatite A. Vacina contra hepatite B  Voc poder precisar dessa vacina se apresentar certos quadros clnicos ou viajar para ou trabalhar em lugares nos quais pode ser exposto  hepatite B. Vacina contra o Haemophilus influenzae tipo b (Hib)  Voc poder precisar dela se sofrer de  certos quadros clnicos. Voc pode tomar vacinas em doses individuais ou diversas vacinas juntas em uma nica dose (vacinas combinadas). Converse com seu mdico sobre os riscos e benefcios das vacinas combinadas. Quais testes eu preciso fazer?  Exames de sangue  Nveis de lipdeos e colesterol. Eles podero ser verificados a  cada 5 anos comeando aos 20 anos de idade.  Exame de hepatite C.  Exame de hepatite B. Exames preventivos  Exame preventivo de diabetes. Isso  feito verificando-se o acar no sangue (glicose) depois de voc no comer por algum tempo (jejum).  Exame para doenas sexualmente transmissveis (DSTs).  Exame de preveno do cncer baseado em BRCA. Esse exame poder ser realizado caso voc tenha histrico de cncer de mama, de ovrio, das tubas uterinas ou de peritnio.  Exame plvico e de Papanicolau. Isso poder ser [employer] a cada 3 anos a [employer] 21 anos de idade. Comeando aos 30 anos de idade, esses exames podero ser feitos a cada 5 anos caso voc realize um exame de Papanicolau junto com um teste de HPV. Discuta com seu mdico seus resultados, opes de tratamento e, se necessrio, a necessidade de [employer]. Siga essas instrues em casa: Alimentos e bebidas   Mantenha uma dieta que inclua frutas e verduras frescas, gros integrais, alimentos, fontes magras de protena e produtos lcteos pobres em gordura.  Tome suplementos de vitaminas e minerais de acordo com as recomendaes do seu mdico.  No consuma lcool se: ? Seu mdico disser para voc no beber. ? Estiver Gordy Clement, puder estar grvida ou planejar engravidar.  Se voc consome lcool: ? Limite seu consumo a 0-1 dose por dia. ? Observe quanto lcool sua bebida contm. Nos EUA, um drinque equivale a uma lata de cerveja de 12 oz (355 ml), uma taa de vinho de 5 oz (148 ml) ou uma dose de bebida destilada de 1 oz (44 ml). Estilo de vida  Faa o cuidado dirio dos seus dentes e gengivas.  Permanea ativa. Exercite-se por pelo menos 30 minutos em 5 dias da semana ou [place].  No use nenhum produto que contenha nicotina ou tabaco, como cigarros tradicionais, cigarros eletrnicos e fumo de Higher education careers adviser. Caso precise de ajuda para parar de fumar, fale com seu mdico.  Se voc for sexualmente ativa, faa sexo seguro. Use um  preservativo ou outra forma de controle de natalidade (contracepo), para evitar a Occupational hygienist e DSTs (doenas sexualmente transmissveis). Caso planeje engravidar, faa uma consulta pr-conceptiva com seu mdico. O que vem a seguir?  Visite seu mdico uma vez por ano para uma consulta de check-up.  Pergunte ao seu mdico com que frequncia seus olhos e dentes devem ser examinados.  Mantenha todas assuas vacinas em dia. Estas informaes no se destinam a substituir as recomendaes de seu mdico. No deixe de discutir quaisquer dvidas com seu mdico. Document Revised: 12/03/2017 Document Reviewed: 12/03/2017 Elsevier Patient Education  2020 [employer].

## 2019-05-03 NOTE — Progress Notes (Signed)
Date of Visit: 05/03/2019   HPI:  Patient presents today for a well woman exam. Visit completed with virtual interpreter present.  Concerns today:  Pain in R breast: For 1-2 months, no bleeding or discharge noted. Never had this before. Does not feel any lumps or bumps. Not currently breast-feeding and has had tubal ligation so no concern for pregnancy at this time.  Patient states that the pain is always in the lower portion of her breast around 7:00.  She has not felt any lumps or masses on home exam.  She reports that it hurts anytime it is touched such as when she hugs her daughter or is putting on or taking off her bra.  She has not started any new exercise routines. Periods: 05/04/2019 Contraception: tubal ligation 2015 Pelvic symptoms: none Sexual activity: With her regular partner STD Screening: will perform today Pap smear status: will perform today Exercise: Does not exercise regularly Smoking: Never Alcohol: does not drink Drugs: never Mood: no concerns today  ROS: See HPI  PMFSH:  Cancers in family: no family hx of breats cancer or others.   PHYSICAL EXAM: BP 124/60   Pulse 92   Wt 196 lb 9.6 oz (89.2 kg)   LMP 04/03/2019   SpO2 97%   BMI 38.40 kg/m  Gen: NAD, pleasant, cooperative HEENT: NCAT, PERRL Lungs: NWOB Breasts: breasts appear normal, no suspicious masses, no skin or nipple changes or axillary nodes, symmetric fibrous changes in both upper outer quadrants. Abdomen: soft, nontender to palpation Neuro: grossly nonfocal, speech normal GU: normal appearing external genitalia without lesions. Vagina is moist with white discharge. Cervix normal in appearance. No cervical motion tenderness or tenderness on bimanual exam. No adnexal masses.  Exam performed with chaperone  ASSESSMENT/PLAN:  # Health maintenance:   -STD screening: Performed GC/chlamydia today, patient does not desire blood test -pap smear: Performed today, last Pap smear 10/02/2013 was  normal -mammogram: n/a -lipid screening: will perform today -DEXA: n/a -immunizations: Declined influenza -handout given on health maintenance topics  #Right breast pain: Exam grossly normal today. Patient low risk given no family history of breast cancer as well as she is a non-smoker and does not drink any alcohol.  No red flags such as unintentional weight loss, night sweats. Discussed options with patient including a possible screening ultrasound.  However she does not currently have insurance and given that she has low risk believe that it is reasonable for her to wait a few weeks to see if the pain improves with Tylenol and ibuprofen.  If the pain does not improve and she is to call the office and I will schedule her for an ultrasound of the breast to rule out any other possible etiologies.  FOLLOW UP: Patient to follow-up in 1 year for annual exam or sooner if breast pain continues.  [place] Caroleen Stoermer, DO PGY-3, [facility]

## 2019-05-04 LAB — LIPID PANEL
Chol/HDL Ratio: 6.3 ratio — ABNORMAL HIGH (ref 0.0–4.4)
Cholesterol, Total: 252 mg/dL — ABNORMAL HIGH (ref 100–199)
HDL: 40 mg/dL (ref >39)
LDL Chol Calc (NIH): 104 mg/dL — ABNORMAL HIGH (ref 0–99)
Triglycerides: 629 mg/dL (ref 0–149)
VLDL Cholesterol Cal: 108 mg/dL — ABNORMAL HIGH (ref 5–40)

## 2019-05-08 LAB — CYTOLOGY - PAP
Chlamydia: NEGATIVE
Comment: NEGATIVE
Comment: NEGATIVE
Comment: NORMAL
Diagnosis: NEGATIVE
High risk HPV: NEGATIVE
Neisseria Gonorrhea: NEGATIVE

## 2019-05-10 NOTE — Progress Notes (Signed)
Would you let Amber Branch know that her Pap smear results were normal?  She was also found to be negative for chlamydia and gonorrhea.  Her next Pap smear should be in 5 years.  Thank you

## 2019-05-21 ENCOUNTER — Encounter: Payer: Self-pay | Admitting: Family Medicine

## 2019-06-29 ENCOUNTER — Ambulatory Visit: Payer: Self-pay | Attending: Internal Medicine

## 2019-06-29 DIAGNOSIS — Z23 Encounter for immunization: Secondary | ICD-10-CM

## 2019-06-29 NOTE — Progress Notes (Signed)
   Covid-19 Vaccination Clinic  Name:  Amber Branch    MRN: [id number] DOB: 1980-11-20  06/29/2019  Ms. Amber Branch was observed post Covid-19 immunization for 15 minutes without incident. She was provided with Vaccine Information Sheet and instruction to access the V-Safe system.   Ms. Amber Branch was instructed to call 911 with any severe reactions post vaccine: Marland Kitchen Difficulty breathing  . Swelling of face and throat  . A fast heartbeat  . A bad rash all over body  . Dizziness and weakness   Immunizations Administered    Name Date Dose VIS Date Route   Pfizer COVID-19 Vaccine 06/29/2019 11:49 AM 0.3 mL 03/03/2019 Intramuscular   Manufacturer: [employer], [employer]   Lot: [id number]   NDC: [address]-1

## 2019-07-24 ENCOUNTER — Ambulatory Visit: Payer: Self-pay | Attending: Internal Medicine

## 2019-07-24 DIAGNOSIS — Z23 Encounter for immunization: Secondary | ICD-10-CM

## 2019-07-24 NOTE — Progress Notes (Signed)
   Covid-19 Vaccination Clinic  Name:  Amber Branch    MRN: [id number] DOB: 03-05-1981  07/24/2019  [name] was observed post Covid-19 immunization for 15 minutes without incident. She was provided with Vaccine Information Sheet and instruction to access the V-Safe system.   [name] was instructed to call 911 with any severe reactions post vaccine: Marland Kitchen Difficulty breathing  . Swelling of face and throat  . A fast heartbeat  . A bad rash all over body  . Dizziness and weakness   Immunizations Administered    Name Date Dose VIS Date Route   Pfizer COVID-19 Vaccine 07/24/2019 11:41 AM 0.3 mL 05/17/2018 Intramuscular   Manufacturer: [employer], [employer]   Lot: [id number]   NDC: [address]-8

## 2020-12-31 ENCOUNTER — Ambulatory Visit (HOSPITAL_COMMUNITY)
Admission: EM | Admit: 2020-12-31 | Discharge: 2020-12-31 | Disposition: A | Discharge status: Home / Self Care | Payer: Self-pay | Attending: Urgent Care | Admitting: Urgent Care

## 2020-12-31 ENCOUNTER — Encounter (HOSPITAL_COMMUNITY): Payer: Self-pay

## 2020-12-31 ENCOUNTER — Other Ambulatory Visit: Payer: Self-pay

## 2020-12-31 DIAGNOSIS — R0981 Nasal congestion: Secondary | ICD-10-CM | POA: Insufficient documentation

## 2020-12-31 DIAGNOSIS — J029 Acute pharyngitis, unspecified: Secondary | ICD-10-CM | POA: Insufficient documentation

## 2020-12-31 DIAGNOSIS — Z885 Allergy status to narcotic agent status: Secondary | ICD-10-CM | POA: Insufficient documentation

## 2020-12-31 DIAGNOSIS — J069 Acute upper respiratory infection, unspecified: Secondary | ICD-10-CM | POA: Insufficient documentation

## 2020-12-31 DIAGNOSIS — Z20822 Contact with and (suspected) exposure to covid-19: Secondary | ICD-10-CM | POA: Insufficient documentation

## 2020-12-31 HISTORY — DX: Bronchitis, not specified as acute or chronic: J40

## 2020-12-31 LAB — POCT RAPID STREP A, ED / UC: Streptococcus, Group A Screen (Direct): NEGATIVE

## 2020-12-31 MED ORDER — PROMETHAZINE-DM 6.25-15 MG/5ML PO SYRP: 5.0000 mL | ORAL_SOLUTION | Freq: Every evening | ORAL | 0 refills | Status: DC | PRN

## 2020-12-31 MED ORDER — CETIRIZINE HCL 10 MG PO TABS: 10.0000 mg | ORAL_TABLET | Freq: Every day | ORAL | 0 refills | Status: AC

## 2020-12-31 MED ORDER — PSEUDOEPHEDRINE HCL 60 MG PO TABS: 60.0000 mg | ORAL_TABLET | Freq: Three times a day (TID) | ORAL | 0 refills | Status: DC | PRN

## 2020-12-31 MED ORDER — BENZONATATE 100 MG PO CAPS: 100.0000 mg | ORAL_CAPSULE | Freq: Three times a day (TID) | ORAL | 0 refills | Status: DC | PRN

## 2020-12-31 NOTE — ED Provider Notes (Signed)
[facility]   MRN: [id number] DOB: [birth year]  Subjective:   Amber Branch is a 40 y.o. female presenting for 2 day history of sneezing, bilateral ear itching, sinus congestion, throat pain, cough. Has been getting Mucinex, Tylenol at home. No chest pain, shob, wheezing, body aches, chills. Multiple sick contacts at home. Wants a COVID and strep test.   No current facility-administered medications for this encounter. No current outpatient medications on file.   Allergies  Allergen Reactions   Codeine Rash    Can take tylenol with codeine and percocet    Past Medical History:  Diagnosis Date   Bronchitis    Gestational diabetes    History of multiple spontaneous abortions    No pertinent past medical history      Past Surgical History:  Procedure Laterality Date   NO PAST SURGERIES     TUBAL LIGATION Bilateral 03/19/2014   Procedure: POST PARTUM TUBAL LIGATION;  Surgeon: Levie Heritage, DO;  Location: [facility];  Service: Gynecology;  Laterality: Bilateral;    Family History  Problem Relation Age of Onset   Diabetes Mother    Anesthesia problems Neg Hx    Hypotension Neg Hx    Malignant hyperthermia Neg Hx    Pseudochol deficiency Neg Hx    Other Neg Hx    Alcohol abuse Neg Hx    Arthritis Neg Hx    Asthma Neg Hx    Birth defects Neg Hx    Cancer Neg Hx    COPD Neg Hx    Depression Neg Hx    Drug abuse Neg Hx    Early death Neg Hx    Hearing loss Neg Hx    Heart disease Neg Hx    Hyperlipidemia Neg Hx    Hypertension Neg Hx    Kidney disease Neg Hx    Learning disabilities Neg Hx    Mental illness Neg Hx    Mental retardation Neg Hx    Miscarriages / Stillbirths Neg Hx    Stroke Neg Hx    Vision loss Neg Hx     Social History   Tobacco Use   Smoking status: Never   Smokeless tobacco: Never  Substance Use Topics   Alcohol use: No   Drug use: No    ROS   Objective:   Vitals: BP 116/76 (BP Location: Left Arm)   Pulse  (!) 107   Temp 97.8 F (36.6 C) (Oral)   Resp 18   LMP 12/16/2020 (Exact Date)   SpO2 97%   Physical Exam Constitutional:      General: She is not in acute distress.    Appearance: Normal appearance. She is well-developed. She is not ill-appearing, toxic-appearing or diaphoretic.  HENT:     Head: Normocephalic and atraumatic.     Right Ear: Tympanic membrane and ear canal normal. No drainage or tenderness. No middle ear effusion. Tympanic membrane is not erythematous.     Left Ear: Tympanic membrane and ear canal normal. No drainage or tenderness.  No middle ear effusion. Tympanic membrane is not erythematous.     Nose: Nose normal. No congestion or rhinorrhea.     Mouth/Throat:     Mouth: Mucous membranes are moist. No oral lesions.     Pharynx: Oropharynx is clear. No pharyngeal swelling, oropharyngeal exudate, posterior oropharyngeal erythema or uvula swelling.     Tonsils: No tonsillar exudate or tonsillar abscesses.  Eyes:     Extraocular Movements: Extraocular  movements intact.     Right eye: Normal extraocular motion.     Left eye: Normal extraocular motion.     Conjunctiva/sclera: Conjunctivae normal.     Pupils: Pupils are equal, round, and reactive to light.  Cardiovascular:     Rate and Rhythm: Normal rate and regular rhythm.     Pulses: Normal pulses.     Heart sounds: Normal heart sounds. No murmur heard.   No friction rub. No gallop.  Pulmonary:     Effort: Pulmonary effort is normal. No respiratory distress.     Breath sounds: Normal breath sounds. No stridor. No wheezing, rhonchi or rales.  Musculoskeletal:     Cervical back: Normal range of motion and neck supple.  Lymphadenopathy:     Cervical: No cervical adenopathy.  Skin:    General: Skin is warm and dry.     Findings: No rash.  Neurological:     General: No focal deficit present.     Mental Status: She is alert and oriented to person, place, and time.  Psychiatric:        Mood and Affect: Mood  normal.        Behavior: Behavior normal.        Thought Content: Thought content normal.   Results for orders placed or performed during the hospital encounter of 12/31/20 (from the past 24 hour(s))  POCT Rapid Strep A     Status: None   Collection Time: 12/31/20  4:41 PM  Result Value Ref Range   Streptococcus, Group A Screen (Direct) NEGATIVE NEGATIVE    Assessment and Plan :   PDMP not reviewed this encounter.  1. Viral URI with cough   2. Nasal congestion      Will manage for viral illness such as viral URI, viral syndrome, viral rhinitis, COVID-19, viral pharyngitis. Counseled patient on nature of COVID-19 including modes of transmission, diagnostic testing, management and supportive care.  Offered scripts for symptomatic relief. COVID 19 and strep culture are pending. Counseled patient on potential for adverse effects with medications prescribed/recommended today, ER and return-to-clinic precautions discussed, patient verbalized understanding.     Wallis Bamberg, PA-C 12/31/20 1707

## 2020-12-31 NOTE — ED Triage Notes (Signed)
Pt presents with itching in the ears, headache, sneezing and sore throat X 2 days. Mom states she had Tylenol for relief.

## 2021-01-01 LAB — SARS CORONAVIRUS 2 (TAT 6-24 HRS): SARS Coronavirus 2: NEGATIVE

## 2021-01-03 LAB — CULTURE, GROUP A STREP (THRC)

## 2021-01-16 ENCOUNTER — Other Ambulatory Visit: Payer: Self-pay

## 2021-01-16 ENCOUNTER — Ambulatory Visit: Payer: Self-pay | Admitting: *Deleted

## 2021-01-16 VITALS — BP 120/74 | Wt 196.2 lb

## 2021-01-16 DIAGNOSIS — Z1239 Encounter for other screening for malignant neoplasm of breast: Secondary | ICD-10-CM

## 2021-01-16 NOTE — Patient Instructions (Signed)
Explained breast self awareness with Elba Barman. Patient did not need a Pap smear today due to last Pap smear and HPV typing was 05/03/2019. Let her know BCCCP will cover Pap smears and HPV typing every 5 years unless has a history of abnormal Pap smears. Referred patient to [facility] for a right breast biopsy per recommendation. Appointment scheduled Thursday, January 23, 2021 at 1345. Patient aware of appointment and will be there. Isabella Cruz-Toledo verbalized understanding.  Debie Ashline, Kathaleen Maser, RN 9:50 AM

## 2021-01-16 NOTE — Progress Notes (Signed)
Amber Branch is a 40 y.o. female who presents to [facility] clinic today with no complaints. Patient referred to [facility] by [facility] due to patient had a screening mammogram completed 12/28/2020 that a right breast ultrasound was recommended for follow-up. Patient had the right breast ultrasound completed 01/09/2021 and a right breast biopsy is recommended for follow-up. Results will be scanned into Epic.   Pap Smear: Pap smear not completed today. Last Pap smear was 05/03/2019 at [facility] clinic and was normal with negative HPV. Per patient has no history of an abnormal Pap smear. Last Pap smear result is available in Epic.   Physical exam: Breasts Breasts symmetrical. No skin abnormalities bilateral breasts. No nipple retraction bilateral breasts. No nipple discharge bilateral breasts. No lymphadenopathy. No lumps palpated bilateral breasts. No complaints of pain or tenderness on exam.      Pelvic/Bimanual Pap is not indicated today per BCCCP guidelines.   Smoking History: Patient has never smoked.   Patient Navigation: Patient education provided. Access to services provided for patient through BCCCP program. Alene Mires Spanish interpreter from [facility] provided.    Breast and Cervical Cancer Risk Assessment: Patient does not have family history of breast cancer, known genetic mutations, or radiation treatment to the chest before age 4. Patient does not have history of cervical dysplasia, immunocompromised, or DES exposure in-utero.  Risk Assessment     Risk Scores       01/16/2021   Last edited by: Narda Rutherford, LPN   5-year risk: 0.3 %   Lifetime risk: 6.4 %            A: BCCCP exam without pap smear No complaints.  P: Referred patient to [facility] for a right breast biopsy per recommendation. Appointment scheduled Thursday, January 23, 2021 at 1345.  Priscille Heidelberg, RN 01/16/2021 9:49 AM

## 2021-01-24 ENCOUNTER — Encounter: Payer: Self-pay | Admitting: Family Medicine

## 2021-01-24 ENCOUNTER — Other Ambulatory Visit: Payer: Self-pay | Admitting: Family Medicine

## 2021-01-24 DIAGNOSIS — N63 Unspecified lump in unspecified breast: Secondary | ICD-10-CM | POA: Insufficient documentation

## 2021-01-24 DIAGNOSIS — N6341 Unspecified lump in right breast, subareolar: Secondary | ICD-10-CM

## 2022-01-07 ENCOUNTER — Other Ambulatory Visit: Payer: Self-pay

## 2022-01-07 DIAGNOSIS — Z1231 Encounter for screening mammogram for malignant neoplasm of breast: Secondary | ICD-10-CM

## 2022-02-10 ENCOUNTER — Ambulatory Visit: Payer: Self-pay | Admitting: *Deleted

## 2022-02-10 VITALS — BP 115/74 | Wt 197.0 lb

## 2022-02-10 DIAGNOSIS — M79621 Pain in right upper arm: Secondary | ICD-10-CM

## 2022-02-10 DIAGNOSIS — Z1239 Encounter for other screening for malignant neoplasm of breast: Secondary | ICD-10-CM

## 2022-02-10 NOTE — Patient Instructions (Signed)
Explained breast self awareness with Amber Branch. Patient did not need a Pap smear today due to last Pap smear and HPV typing was 05/03/2019. Let her know BCCCP will cover Pap smears and HPV typing every 5 years unless has a history of abnormal Pap smears. Referred patient to [facility] for a diagnostic mammogram. Appointment scheduled Thursday, February 19, 2022 at 0945. Patient aware of appointment and will be there. Amber Branch verbalized understanding.  Anisah Kuck, Kathaleen Maser, RN 2:08 PM

## 2022-02-10 NOTE — Progress Notes (Signed)
[name] is a 41 y.o. female who presents to [facility] clinic today with complaint of right axillary pain x 4 months that comes and goes. Patient states is worse when she wears her bra. Patient rates the pain at a 6-7 out of 10.    Pap Smear: Pap smear not completed today. Last Pap smear was 05/03/2019 at [facility] clinic and was normal with negative HPV. Per patient has no history of an abnormal Pap smear. Last Pap smear result is available in Epic.    Physical exam: Breasts Breasts symmetrical. No skin abnormalities bilateral breasts. No nipple retraction bilateral breasts. No nipple discharge bilateral breasts. No lymphadenopathy. No lumps palpated bilateral breasts. Complaints of right axillary tenderness on exam.  Pelvic/Bimanual Pap is not indicated today per BCCCP guidelines.   Smoking History: Patient has never smoked.    Patient Navigation: Patient education provided. Access to services provided for patient through [facility] program. Spanish interpreter Viviana Simpler from [facility] provided.   Breast and Cervical Cancer Risk Assessment: Patient does not have family history of breast cancer, known genetic mutations, or radiation treatment to the chest before age 36. Patient does not have history of cervical dysplasia, immunocompromised, or DES exposure in-utero.   Risk Scores as of 02/10/2022     Amber Branch           5-year 0.6 %   Lifetime 9.79 %   This patient is [place] but has no documented birth country, so the [place] model used data from [place] patients to calculate their risk score. Document a birth country in the Demographics activity for a more accurate score.         Last calculated by Caprice Red, CMA on 02/10/2022 at  2:11 PM       A: BCCCP exam without pap smear Complaint of right axillary pain.  P: Referred patient to [facility] for a diagnostic mammogram. Appointment scheduled Thursday, February 19, 2022 at  0945.  Priscille Heidelberg, RN 02/10/2022 2:08 PM

## 2022-03-05 ENCOUNTER — Encounter: Payer: Self-pay | Admitting: Student

## 2022-03-05 ENCOUNTER — Ambulatory Visit (INDEPENDENT_AMBULATORY_CARE_PROVIDER_SITE_OTHER): Payer: Self-pay | Admitting: Student

## 2022-03-05 VITALS — BP 114/82 | HR 76 | Ht 60.0 in | Wt 198.4 lb

## 2022-03-05 DIAGNOSIS — E782 Mixed hyperlipidemia: Secondary | ICD-10-CM

## 2022-03-05 DIAGNOSIS — M25511 Pain in right shoulder: Secondary | ICD-10-CM

## 2022-03-05 DIAGNOSIS — Z131 Encounter for screening for diabetes mellitus: Secondary | ICD-10-CM

## 2022-03-05 DIAGNOSIS — G8929 Other chronic pain: Secondary | ICD-10-CM

## 2022-03-05 DIAGNOSIS — Z1159 Encounter for screening for other viral diseases: Secondary | ICD-10-CM

## 2022-03-05 LAB — POCT GLYCOSYLATED HEMOGLOBIN (HGB A1C): Hemoglobin A1C: 5.4 % (ref 4.0–5.6)

## 2022-03-05 NOTE — Assessment & Plan Note (Addendum)
~  5 months. No injury or trauma to shoulder. Exam limited to need for annual examination. Exam suggestive of rotator cuff sprain v. Impingement. Will treat symptomatically with OTC tylenol/ibuprofen and have patient return for more thorough exam.  -Follow-up at next availability

## 2022-03-05 NOTE — Progress Notes (Signed)
    SUBJECTIVE:   Chief compliant/HPI: annual examination  Amber Branch is a 41 y.o. who presents today for an annual exam.   Annual Wellness Would like lab work today, including Hep C screening. Pap due next year. Not currently taking medication. No issues aside from right shoulder pain and a lesion on her right foot.  Right shoulder  Pain started 5 months ago. No trauma. Not painful at rest. Pain with abduction and external rotation. Tylenol helps, but not completely.   PMH: Right tennis elbow, hypertriglyceridemia  PSH: Tubal ligation  OBJECTIVE:   BP 114/82   Pulse 76   Ht 5' (1.524 m)   Wt 198 lb 6 oz (90 kg)   LMP 03/02/2022 (Exact Date)   SpO2 99%   BMI 38.74 kg/m    Last LMP: Monday (03/05/2022)  General: NAD, well-appearing Cardio: RRR, no MRG Resp: CTAB, normal wob on RA MSK: Full passive and active ROM. No point tenderness. Pain with empty-can and resisted abduction. Pain with Neer, no pain with cross body adduction test. Extremities: Non erythematous, non-draining, not warm lesion on dorsum of right foot.   ASSESSMENT/PLAN:   Annual Examination  See AVS for age appropriate recommendations.   Blood pressure reviewed and at goal.  Considered the following items based upon USPSTF recommendations: -Diabetes screening: ordered -Hepatitis C screening: Ordered -Screening for elevated cholesterol: ordered -Ordered  BMP for renal function. -Cervical cancer screening: Pap due next year -Follow up in 1 year or sooner if indicated.   Right shoulder pain ~5 months. No injury or trauma to shoulder. Exam limited to need for annual examination. Exam suggestive of rotator cuff sprain v. Impingement. Will treat symptomatically with OTC tylenol/ibuprofen and have patient return for more thorough exam.  -Follow-up at next availability  Right foot lesion Patient reported lesion after visit was completed. Briefly saw a healing, no erythematous, non draining and not  warm lesion on dorsum of right foot. -Follow-up at next availability  Tiffany Kocher, DO [facility]

## 2022-03-05 NOTE — Patient Instructions (Signed)
It was great to see you! Thank you for allowing me to participate in your care!   I recommend that you always bring your medications to each appointment as this makes it easy to ensure we are on the correct medications and helps [place] not miss when refills are needed.  Our plans for today:  - Continue to take tylenol, you may also take ibuprofen for pain. Recommend using ice. - Please make appointment for shoulder and foot evaluation  We are checking some labs today, I will call you if they are abnormal will send you a MyChart message or a letter if they are normal.  If you do not hear about your labs in the next 2 weeks please let us know.  Take care and seek immediate care sooner if you develop any concerns. Please remember to show up 15 minutes before your scheduled appointment time!  Tiffany Kocher, DO [facility]

## 2022-03-06 LAB — BASIC METABOLIC PANEL
BUN/Creatinine Ratio: 26 — ABNORMAL HIGH (ref 9–23)
BUN: 12 mg/dL (ref 6–24)
CO2: 21 mmol/L (ref 20–29)
Calcium: 8.9 mg/dL (ref 8.7–10.2)
Chloride: 104 mmol/L (ref 96–106)
Creatinine, Ser: 0.47 mg/dL — ABNORMAL LOW (ref 0.57–1.00)
Glucose: 94 mg/dL (ref 70–99)
Potassium: 4.3 mmol/L (ref 3.5–5.2)
Sodium: 138 mmol/L (ref 134–144)
eGFR: 123 mL/min/{1.73_m2} (ref >59)

## 2022-03-06 LAB — LIPID PANEL
Chol/HDL Ratio: 5.5 ratio — ABNORMAL HIGH (ref 0.0–4.4)
Cholesterol, Total: 209 mg/dL — ABNORMAL HIGH (ref 100–199)
HDL: 38 mg/dL — ABNORMAL LOW (ref >39)
LDL Chol Calc (NIH): 117 mg/dL — ABNORMAL HIGH (ref 0–99)
Triglycerides: 308 mg/dL — ABNORMAL HIGH (ref 0–149)
VLDL Cholesterol Cal: 54 mg/dL — ABNORMAL HIGH (ref 5–40)

## 2022-03-06 LAB — HEPATITIS C ANTIBODY: Hep C Virus Ab: NONREACTIVE

## 2022-04-01 ENCOUNTER — Ambulatory Visit (INDEPENDENT_AMBULATORY_CARE_PROVIDER_SITE_OTHER): Payer: Self-pay | Admitting: Student

## 2022-04-01 ENCOUNTER — Encounter: Payer: Self-pay | Admitting: Student

## 2022-04-01 VITALS — BP 118/70 | HR 76 | Wt 205.1 lb

## 2022-04-01 DIAGNOSIS — L280 Lichen simplex chronicus: Secondary | ICD-10-CM

## 2022-04-01 DIAGNOSIS — R350 Frequency of micturition: Secondary | ICD-10-CM | POA: Insufficient documentation

## 2022-04-01 LAB — POCT URINALYSIS DIP (MANUAL ENTRY)
Bilirubin, UA: NEGATIVE
Blood, UA: NEGATIVE
Glucose, UA: NEGATIVE mg/dL
Ketones, POC UA: NEGATIVE mg/dL
Leukocytes, UA: NEGATIVE
Nitrite, UA: NEGATIVE
Protein Ur, POC: NEGATIVE mg/dL
Spec Grav, UA: 1.025 (ref 1.010–1.025)
Urobilinogen, UA: 0.2 E.U./dL
pH, UA: 5.5 (ref 5.0–8.0)

## 2022-04-01 MED ORDER — TRIAMCINOLONE ACETONIDE 0.1 % EX OINT: 1.0000 | TOPICAL_OINTMENT | Freq: Two times a day (BID) | CUTANEOUS | 1 refills | Status: DC

## 2022-04-01 NOTE — Patient Instructions (Addendum)
It was great to see you! Thank you for allowing me to participate in your care!   I recommend that you always bring your medications to each appointment as this makes it easy to ensure we are on the correct medications and helps [place] not miss when refills are needed.  Our plans for today:  - I have prescribed triamcinolone cream. Please use twice daily, until lesion is resolved. If not improved in 2 weeks, please stop the medication and make appointment to be seen. - At this time you do not have a urinary tract infection. I recommend restricting fluid intake 2 hours before bed to prevent accidents overnight. Continue to exercise frequently.   Nuestros planes para hoy: - Le he recetado crema de triamcinolona. selo dos veces al da hasta que se resuelva la lesin. Si no mejora en 2 semanas, suspenda el medicamento y programe una cita para ser atendido. - En este momento no tienes infeccin del tracto urinario. Recomiendo restringir la ingesta de lquidos 2 horas antes de acostarse para evitar accidentes durante la noche. Contine haciendo ejercicio con frecuencia.  Take care and seek immediate care sooner if you develop any concerns. Please remember to show up 15 minutes before your scheduled appointment time!  Leslie Dales, DO [facility]

## 2022-04-01 NOTE — Progress Notes (Signed)
    SUBJECTIVE:   CHIEF COMPLAINT / HPI:   Frequent urination 1 month ago, noticed she will urinate when she sneezes. Sometimes she cannot make to bathroom on time. No pain. Similar problem a year ago, reports taking OTC pills that improved her symptoms. Sexually active with one partner, but tubal ligation makes pregnancy less likely. 5 children, all SVD.   Lesion of right foot Lesion on foot present for "long time" it is pruritic. Not painful. Not improving, but the same. Has tried neosporin, no other treatment. No other lesion on body.   PERTINENT  PMH / PSH: Dyshidrotic eczema  OBJECTIVE:   BP 118/70   Pulse 76   Wt 205 lb 2 oz (93 kg)   LMP 03/02/2022 (Exact Date)   SpO2 100%   BMI 40.06 kg/m    General: NAD, well-appearing, well-nourished Respiratory: No respiratory distress, breathing comfortably, able to speak in full sentences Psych: Appropriate affect and mood Skin: Warm and dry, no lesions except for dorsum of right foot.  Hyperpigmented scaly lesion.  See below.   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  Hyperpigmented and scaly.  Leading differential is lichen simplex.  Differential still includes nummular eczema, contact dermatitis, psoriasis/other autoimmune process, melanoma.  Will trial course of treatment and close observation. - Triamcinolone 0.1% twice daily - If does not improve or worsens, could consider referral to our Derm clinic for biopsy  Frequent urination UA negative for infection.  Based on HPI suspect this is likely stress incontinence.  Differential includes overactive bladder and overflow incontinence although both unlikely at this time based on symptoms.  Unfortunately patient is without insurance, making urology referral difficult.  We discussed extensively the diagnosis of stress incontinence and options.  Patient opted to attempt weight loss and fluid restriction in the evenings  and before events. - Trial lifestyle modifications - Consider urology referral    Amber Branch, [name]

## 2022-04-01 NOTE — Assessment & Plan Note (Addendum)
UA negative for infection.  Based on HPI suspect this is likely stress incontinence.  Differential includes overactive bladder and overflow incontinence although both unlikely at this time based on symptoms.  Unfortunately patient is without insurance, making urology referral difficult.  We discussed extensively the diagnosis of stress incontinence and options.  Patient opted to attempt weight loss and fluid restriction in the evenings and before events. - Trial lifestyle modifications - Consider urology referral

## 2022-04-01 NOTE — Assessment & Plan Note (Signed)
Lesion present for many months, pruritic and patient scratches often.  Hyperpigmented and scaly.  Leading differential is lichen simplex.  Differential still includes nummular eczema, contact dermatitis, psoriasis/other autoimmune process, melanoma.  Will trial course of treatment and close observation. - Triamcinolone 0.1% twice daily - If does not improve or worsens, could consider referral to our Derm clinic for biopsy

## 2022-04-10 ENCOUNTER — Encounter (HOSPITAL_COMMUNITY): Payer: Self-pay | Admitting: *Deleted

## 2022-04-10 ENCOUNTER — Ambulatory Visit (HOSPITAL_COMMUNITY)
Admission: EM | Admit: 2022-04-10 | Discharge: 2022-04-10 | Disposition: A | Discharge status: Home / Self Care | Payer: Self-pay | Attending: Internal Medicine | Admitting: Internal Medicine

## 2022-04-10 DIAGNOSIS — S46911A Strain of unspecified muscle, fascia and tendon at shoulder and upper arm level, right arm, initial encounter: Secondary | ICD-10-CM

## 2022-04-10 MED ORDER — BACLOFEN 10 MG PO TABS: 10.0000 mg | ORAL_TABLET | Freq: Three times a day (TID) | ORAL | 0 refills | Status: DC

## 2022-04-10 NOTE — ED Triage Notes (Signed)
Pt states she has right shoulder pain x 1 week she is taking IBU with some relief but she doesn't recall any injury or knowledge of why she has th pain.

## 2022-04-10 NOTE — Discharge Instructions (Signed)
Your pain is likely due to a muscle strain which will improve on its own with time.   - Take ibuprofen 800mg with food every 8 hours as needed for pain and inflammation. Do not take any other NSAID containing medicine when taking ibuprofen.   - You may also take the prescribed muscle relaxer as directed as needed for muscle aches/spasm.  Do not take this medication and drive or drink alcohol as it can make you sleepy.  Mainly use this medicine at nighttime as needed. - Apply heat 20 minutes on then 20 minutes off and perform gentle range of motion exercises to the area of greatest pain to prevent muscle stiffness and provide further pain relief.   Red flag symptoms to watch out for are numbness/tingling to the legs, weakness, loss of bowel/bladder control, and/or worsening pain that does not respond well to medicines. Follow-up with your primary care provider or return to urgent care if your symptoms do not improve in the next 3 to 4 days with medications and interventions recommended today. If your symptoms are severe (red flag), please go to the emergency room.  I hope you feel better!  

## 2022-04-10 NOTE — ED Provider Notes (Signed)
[facility]    CSN: [id number] Arrival date & time: 04/10/22  6761      History   Chief Complaint Chief Complaint  Patient presents with   Shoulder Pain    HPI Amber Branch is a 42 y.o. female.   Patient presents urgent care for evaluation of right-sided shoulder pain and right-sided neck pain that started approximately 1 week ago.  She cannot identify any triggering injury or fall that cause pain.  Patient does not work.  States pain is worse with movement of the right upper extremity and the right neck.  Denies numbness and tingling to the bilateral upper extremities, decreased strength to the upper extremities, history of neck/right arm/right shoulder injuries, headache, fever/chills, viral URI symptoms, chest pain, shortness of breath, and warmth, swelling, and/or redness to the shoulder.  She has been using ibuprofen 800 mg every 8 hours to help with pain and states that this has helped slightly, however once the medicine wears off the pain returns and is significant.  States the pain wakes her up out of her sleep at night.  Last dose of ibuprofen was 3 AM this morning (over 8 hours ago).    Shoulder Pain   Past Medical History:  Diagnosis Date   Bronchitis    Gestational diabetes    History of multiple spontaneous abortions    No pertinent past medical history     Patient Active Problem List   Diagnosis Date Noted   Frequent urination 04/01/2022   Lichen simplex 04/01/2022   Right shoulder pain 03/05/2022   Breast mass in female 01/24/2021   Headache 12/24/2015   Right leg pain 06/24/2015   Bariatric surgery status 06/24/2015   Vaginal delivery 03/20/2014   Status post tubal ligation at time of delivery, current hospitalization 03/20/2014   Labor, precipitous 03/19/2014   Maternal morbid obesity in third trimester, antepartum (HCC)    Fetal macrosomia    Right tennis elbow 06/20/2013   Insomnia 06/20/2013   Dysthymia 04/28/2013   Dyshidrotic  eczema 12/23/2012   Low back pain radiating to left leg 12/09/2012   Hypertriglyceridemia 01/09/2011    Past Surgical History:  Procedure Laterality Date   NO PAST SURGERIES     TUBAL LIGATION Bilateral 03/19/2014   Procedure: POST PARTUM TUBAL LIGATION;  Surgeon: Levie Heritage, DO;  Location: [facility];  Service: Gynecology;  Laterality: Bilateral;    OB History     Gravida  9   Para  5   Term  5   Preterm      AB  4   Living  5      SAB  4   IAB      Ectopic      Multiple  0   Live Births  5            Home Medications    Prior to Admission medications   Medication Sig Start Date End Date Taking? Authorizing Provider  baclofen (LIORESAL) 10 MG tablet Take 1 tablet (10 mg total) by mouth 3 (three) times daily. 04/10/22  Yes Carlisle Beers, FNP  benzonatate (TESSALON) 100 MG capsule Take 1-2 capsules (100-200 mg total) by mouth 3 (three) times daily as needed for cough. Patient not taking: Reported on 02/10/2022 12/31/20   Wallis Bamberg, PA-C  cetirizine (ZYRTEC ALLERGY) 10 MG tablet Take 1 tablet (10 mg total) by mouth daily. Patient not taking: Reported on 02/10/2022 12/31/20   Wallis Bamberg, PA-C  triamcinolone ointment (  KENALOG) 0.1 % Apply 1 Application topically 2 (two) times daily. 04/01/22   Leslie Dales, DO    Family History Family History  Problem Relation Age of Onset   Diabetes Mother    Hypertension Father    Diabetes Brother    Anesthesia problems Neg Hx    Hypotension Neg Hx    Malignant hyperthermia Neg Hx    Pseudochol deficiency Neg Hx    Other Neg Hx    Alcohol abuse Neg Hx    Arthritis Neg Hx    Asthma Neg Hx    Birth defects Neg Hx    Cancer Neg Hx    COPD Neg Hx    Depression Neg Hx    Drug abuse Neg Hx    Early death Neg Hx    Hearing loss Neg Hx    Heart disease Neg Hx    Hyperlipidemia Neg Hx    Kidney disease Neg Hx    Learning disabilities Neg Hx    Mental illness Neg Hx    Mental retardation Neg Hx     Miscarriages / Stillbirths Neg Hx    Stroke Neg Hx    Vision loss Neg Hx     Social History Social History   Tobacco Use   Smoking status: Never   Smokeless tobacco: Never  Vaping Use   Vaping Use: Never used  Substance Use Topics   Alcohol use: No   Drug use: No     Allergies   Codeine   Review of Systems Review of Systems Per HPI  Physical Exam Triage Vital Signs ED Triage Vitals  Enc Vitals Group     BP 04/10/22 1114 125/74     Pulse Rate 04/10/22 1114 66     Resp 04/10/22 1114 18     Temp 04/10/22 1114 97.8 F (36.6 C)     Temp Source 04/10/22 1114 Oral     SpO2 04/10/22 1114 96 %     Weight --      Height --      Head Circumference --      Peak Flow --      Pain Score 04/10/22 1113 9     Pain Loc --      Pain Edu? --      Excl. in [place]? --    No data found.  Updated Vital Signs BP 125/74 (BP Location: Right Arm)   Pulse 66   Temp 97.8 F (36.6 C) (Oral)   Resp 18   LMP 04/01/2022 (Approximate)   SpO2 96%   Visual Acuity Right Eye Distance:   Left Eye Distance:   Bilateral Distance:    Right Eye Near:   Left Eye Near:    Bilateral Near:     Physical Exam Vitals and nursing note reviewed.  Constitutional:      Appearance: She is not ill-appearing or toxic-appearing.  HENT:     Head: Normocephalic and atraumatic.     Right Ear: Hearing and external ear normal.     Left Ear: Hearing and external ear normal.     Nose: Nose normal.     Mouth/Throat:     Lips: Pink.  Eyes:     General: Lids are normal. Vision grossly intact. Gaze aligned appropriately.     Extraocular Movements: Extraocular movements intact.     Conjunctiva/sclera: Conjunctivae normal.  Neck:     Trachea: Trachea and phonation normal.     Comments: TTP to the right cervical paraspinals  range of motion to the neck is normal. Cardiovascular:     Rate and Rhythm: Normal rate and regular rhythm.     Heart sounds: Normal heart sounds, S1 normal and S2 normal.  Pulmonary:      Effort: Pulmonary effort is normal. No respiratory distress.     Breath sounds: Normal breath sounds and air entry.  Musculoskeletal:     Right shoulder: Tenderness present. No swelling, deformity, effusion, laceration or crepitus. Decreased range of motion. Normal strength. Normal pulse.     Cervical back: Normal range of motion and neck supple. No edema, erythema, signs of trauma, rigidity, torticollis or crepitus. Pain with movement and muscular tenderness present. No spinous process tenderness. Normal range of motion.     Comments: Decreased range of motion at the shoulder joint of the right upper extremity.  No obvious deformities, signs of injury, warmth, swelling, or redness to the right upper extremity/neck.  She is tender to palpation at the generalized right trapezius muscle as well as the right shoulder joint.  Strength and sensation intact to bilateral upper extremities.  +2 bilateral radial pulses palpable.  Skin:    General: Skin is warm and dry.     Capillary Refill: Capillary refill takes less than 2 seconds.     Findings: No rash.  Neurological:     General: No focal deficit present.     Mental Status: She is alert and oriented to person, place, and time. Mental status is at baseline.     Cranial Nerves: No dysarthria or facial asymmetry.  Psychiatric:        Mood and Affect: Mood normal.        Speech: Speech normal.        Behavior: Behavior normal.        Thought Content: Thought content normal.        Judgment: Judgment normal.      UC Treatments / Results  Labs (all labs ordered are listed, but only abnormal results are displayed) Labs Reviewed - No data to display  EKG   Radiology No results found.  Procedures Procedures (including critical care time)  Medications Ordered in UC Medications - No data to display  Initial Impression / Assessment and Plan / UC Course  I have reviewed the triage vital signs and the nursing notes.  Pertinent labs &  imaging results that were available during my care of the patient were reviewed by me and considered in my medical decision making (see chart for details).  1.  Strain of right shoulder  Presentation is consistent with acute muscle strain of the right trapezius that will likely resolve with rest, fluids, as needed use of ibuprofen and muscle relaxer, heat, and gentle range of motion exercises. May take ibuprofen 800 mg every 8 hours and baclofen muscle relaxer every 8 hours as needed for muscle spasm. Drowsiness precautions regarding muscle relaxer use discussed. Heat and gentle ROM exercises discussed. Deferred imaging today based on stable musculoskeletal exam findings and hemodynamically stable vital signs. Walking referral given to orthopedic provider should symptoms fail to improve in the next 1-2 weeks.   Discussed physical exam and available lab work findings in clinic with patient.  Counseled patient regarding appropriate use of medications and potential side effects for all medications recommended or prescribed today. Discussed red flag signs and symptoms of worsening condition,when to call the PCP office, return to urgent care, and when to seek higher level of care in the emergency department. Patient verbalizes  understanding and agreement with plan. All questions answered. Patient discharged in stable condition.    Final Clinical Impressions(s) / UC Diagnoses   Final diagnoses:  Strain of right shoulder, initial encounter     Discharge Instructions      Your pain is likely due to a muscle strain which will improve on its own with time.   - Take ibuprofen 800mg  with food every 8 hours as needed for pain and inflammation. Do not take any other NSAID containing medicine when taking ibuprofen.   - You may also take the prescribed muscle relaxer as directed as needed for muscle aches/spasm.  Do not take this medication and drive or drink alcohol as it can make you sleepy.  Mainly use this  medicine at nighttime as needed. - Apply heat 20 minutes on then 20 minutes off and perform gentle range of motion exercises to the area of greatest pain to prevent muscle stiffness and provide further pain relief.   Red flag symptoms to watch out for are numbness/tingling to the legs, weakness, loss of bowel/bladder control, and/or worsening pain that does not respond well to medicines. Follow-up with your primary care provider or return to urgent care if your symptoms do not improve in the next 3 to 4 days with medications and interventions recommended today. If your symptoms are severe (red flag), please go to the emergency room.  I hope you feel better!    ED Prescriptions     Medication Sig Dispense Auth. Provider   baclofen (LIORESAL) 10 MG tablet Take 1 tablet (10 mg total) by mouth 3 (three) times daily. 42 each Talbot Grumbling, FNP      PDMP not reviewed this encounter.   Talbot Grumbling, [place] 04/10/22 1158

## 2022-09-17 ENCOUNTER — Emergency Department (HOSPITAL_COMMUNITY): Payer: Self-pay

## 2022-09-17 ENCOUNTER — Emergency Department (HOSPITAL_COMMUNITY)
Admission: EM | Admit: 2022-09-17 | Discharge: 2022-09-17 | Disposition: A | Discharge status: Home / Self Care | Payer: Self-pay | Attending: Emergency Medicine | Admitting: Emergency Medicine

## 2022-09-17 ENCOUNTER — Other Ambulatory Visit: Payer: Self-pay

## 2022-09-17 DIAGNOSIS — N132 Hydronephrosis with renal and ureteral calculous obstruction: Secondary | ICD-10-CM | POA: Insufficient documentation

## 2022-09-17 DIAGNOSIS — N200 Calculus of kidney: Secondary | ICD-10-CM

## 2022-09-17 LAB — CBC
HCT: 37.3 % (ref 36.0–46.0)
Hemoglobin: 12.3 g/dL (ref 12.0–15.0)
MCH: 29.1 pg (ref 26.0–34.0)
MCHC: 33 g/dL (ref 30.0–36.0)
MCV: 88.4 fL (ref 80.0–100.0)
Platelets: 244 10*3/uL (ref 150–400)
RBC: 4.22 MIL/uL (ref 3.87–5.11)
RDW: 13.2 % (ref 11.5–15.5)
WBC: 5.6 10*3/uL (ref 4.0–10.5)
nRBC: 0 % (ref 0.0–0.2)

## 2022-09-17 LAB — COMPREHENSIVE METABOLIC PANEL
ALT: 25 U/L (ref 0–44)
AST: 26 U/L (ref 15–41)
Albumin: 3.6 g/dL (ref 3.5–5.0)
Alkaline Phosphatase: 68 U/L (ref 38–126)
Anion gap: 9 (ref 5–15)
BUN: 9 mg/dL (ref 6–20)
CO2: 22 mmol/L (ref 22–32)
Calcium: 8.6 mg/dL — ABNORMAL LOW (ref 8.9–10.3)
Chloride: 105 mmol/L (ref 98–111)
Creatinine, Ser: 0.59 mg/dL (ref 0.44–1.00)
GFR, Estimated: >60 mL/min (ref >60)
Glucose, Bld: 134 mg/dL — ABNORMAL HIGH (ref 70–99)
Potassium: 3.9 mmol/L (ref 3.5–5.1)
Sodium: 136 mmol/L (ref 135–145)
Total Bilirubin: 0.4 mg/dL (ref 0.3–1.2)
Total Protein: 6.9 g/dL (ref 6.5–8.1)

## 2022-09-17 LAB — URINALYSIS, ROUTINE W REFLEX MICROSCOPIC
Bilirubin Urine: NEGATIVE
Glucose, UA: NEGATIVE mg/dL
Ketones, ur: NEGATIVE mg/dL
Leukocytes,Ua: NEGATIVE
Nitrite: NEGATIVE
Protein, ur: NEGATIVE mg/dL
RBC / HPF: >50 RBC/hpf (ref 0–5)
Specific Gravity, Urine: 1.017 (ref 1.005–1.030)
pH: 5 (ref 5.0–8.0)

## 2022-09-17 LAB — LIPASE, BLOOD: Lipase: 29 U/L (ref 11–51)

## 2022-09-17 LAB — PREGNANCY, URINE: Preg Test, Ur: NEGATIVE

## 2022-09-17 MED ORDER — KETOROLAC TROMETHAMINE 30 MG/ML IJ SOLN
15.0000 mg | Freq: Once | INTRAMUSCULAR | Status: AC
  Administered 2022-09-17: 15 mg via INTRAVENOUS
  Filled 2022-09-17: qty 1

## 2022-09-17 MED ORDER — ONDANSETRON HCL 4 MG PO TABS: 4.0000 mg | ORAL_TABLET | Freq: Three times a day (TID) | ORAL | 0 refills | Status: DC | PRN

## 2022-09-17 MED ORDER — IOHEXOL 350 MG/ML SOLN
75.0000 mL | Freq: Once | INTRAVENOUS | Status: AC | PRN
  Administered 2022-09-17: 75 mL via INTRAVENOUS

## 2022-09-17 MED ORDER — ONDANSETRON HCL 4 MG/2ML IJ SOLN
4.0000 mg | Freq: Once | INTRAMUSCULAR | Status: AC
  Administered 2022-09-17: 4 mg via INTRAVENOUS
  Filled 2022-09-17: qty 2

## 2022-09-17 MED ORDER — HYDROCODONE-ACETAMINOPHEN 5-325 MG PO TABS
1.0000 | ORAL_TABLET | Freq: Once | ORAL | Status: AC
  Administered 2022-09-17: 1 via ORAL
  Filled 2022-09-17: qty 1

## 2022-09-17 MED ORDER — HYDROCODONE-ACETAMINOPHEN 5-325 MG PO TABS: 1.0000 | ORAL_TABLET | Freq: Four times a day (QID) | ORAL | 0 refills | Status: DC | PRN

## 2022-09-17 NOTE — ED Provider Notes (Signed)
[place] EMERGENCY DEPARTMENT AT [facility] Provider Note   CSN: [id number] Arrival date & time: 09/17/22  1607     History  No chief complaint on file.   Amber Branch is a 42 y.o. female.  Patient is a 42 year old female with no significant medical problems who is presenting today with sudden onset of left-sided abdominal pain at 1 PM today.  She reports that she was feeling fine until 1 PM when the pain started.  She was not doing anything specific when the pain started but has not resolved since it started.  It is in the left side of the abdomen but does not radiate or go into her back.  She denies any nausea, vomiting, diarrhea or urinary symptoms.  She has never had any abdominal surgeries and denies ever having pain like this in the past.  Nothing she does makes the pain any better and she has not tried taking anything at home.  Eating, deep breaths or movement did not seem to affect the pain.  She reports her last menses is currently on right now but is starting to finish  The history is provided by the patient. The history is limited by a language barrier. A language interpreter was used.       Home Medications Prior to Admission medications   Medication Sig Start Date End Date Taking? Authorizing Provider  HYDROcodone-acetaminophen (NORCO/VICODIN) 5-325 MG tablet Take 1 tablet by mouth every 6 (six) hours as needed for severe pain. 09/17/22  Yes Gwyneth Sprout, MD  ondansetron (ZOFRAN) 4 MG tablet Take 1 tablet (4 mg total) by mouth every 8 (eight) hours as needed for nausea or vomiting. 09/17/22  Yes Gwyneth Sprout, MD  baclofen (LIORESAL) 10 MG tablet Take 1 tablet (10 mg total) by mouth 3 (three) times daily. 04/10/22   Carlisle Beers, FNP  benzonatate (TESSALON) 100 MG capsule Take 1-2 capsules (100-200 mg total) by mouth 3 (three) times daily as needed for cough. Patient not taking: Reported on 02/10/2022 12/31/20   Wallis Bamberg, PA-C   cetirizine (ZYRTEC ALLERGY) 10 MG tablet Take 1 tablet (10 mg total) by mouth daily. Patient not taking: Reported on 02/10/2022 12/31/20   Wallis Bamberg, PA-C  triamcinolone ointment (KENALOG) 0.1 % Apply 1 Application topically 2 (two) times daily. 04/01/22   Tiffany Kocher, DO      Allergies    Codeine    Review of Systems   Review of Systems  Physical Exam Updated Vital Signs BP 139/83 (BP Location: Right Arm)   Pulse 89   Temp 98.7 F (37.1 C) (Oral)   Resp 18   LMP 09/17/2022 (Exact Date)   SpO2 96%  Physical Exam Vitals and nursing note reviewed.  Constitutional:      General: She is not in acute distress.    Appearance: She is well-developed.  HENT:     Head: Normocephalic and atraumatic.  Eyes:     Pupils: Pupils are equal, round, and reactive to light.  Cardiovascular:     Rate and Rhythm: Normal rate and regular rhythm.     Heart sounds: Normal heart sounds. No murmur heard.    No friction rub.  Pulmonary:     Effort: Pulmonary effort is normal.     Breath sounds: Normal breath sounds. No wheezing or rales.  Abdominal:     General: Bowel sounds are normal. There is no distension.     Palpations: Abdomen is soft.     Tenderness: There  is abdominal tenderness in the left upper quadrant and left lower quadrant. There is no guarding or rebound.  Musculoskeletal:        General: No tenderness. Normal range of motion.     Comments: No edema  Skin:    General: Skin is warm and dry.     Findings: No rash.  Neurological:     Mental Status: She is alert and oriented to person, place, and time.     Cranial Nerves: No cranial nerve deficit.  Psychiatric:        Behavior: Behavior normal.     ED Results / Procedures / Treatments   Labs (all labs ordered are listed, but only abnormal results are displayed) Labs Reviewed  COMPREHENSIVE METABOLIC PANEL - Abnormal; Notable for the following components:      Result Value   Glucose, Bld 134 (*)    Calcium 8.6 (*)     All other components within normal limits  URINALYSIS, ROUTINE W REFLEX MICROSCOPIC - Abnormal; Notable for the following components:   APPearance HAZY (*)    Hgb urine dipstick LARGE (*)    Bacteria, UA RARE (*)    All other components within normal limits  LIPASE, BLOOD  CBC  PREGNANCY, URINE  HCG, SERUM, QUALITATIVE    EKG None  Radiology CT ABDOMEN PELVIS W CONTRAST  Result Date: 09/17/2022 CLINICAL DATA:  Left-sided abdominal pain. EXAM: CT ABDOMEN AND PELVIS WITH CONTRAST TECHNIQUE: Multidetector CT imaging of the abdomen and pelvis was performed using the standard protocol following bolus administration of intravenous contrast. RADIATION DOSE REDUCTION: This exam was performed according to the departmental dose-optimization program which includes automated exposure control, adjustment of the mA and/or kV according to patient size and/or use of iterative reconstruction technique. CONTRAST:  75mL OMNIPAQUE IOHEXOL 350 MG/ML SOLN COMPARISON:  None Available. FINDINGS: Lower chest: No acute abnormality. Hepatobiliary: There is diffuse fatty infiltration of the liver parenchyma. No focal liver abnormality is seen. No gallstones, gallbladder wall thickening, or biliary dilatation. Pancreas: Unremarkable. No pancreatic ductal dilatation or surrounding inflammatory changes. Spleen: Normal in size without focal abnormality. Adrenals/Urinary Tract: Adrenal glands are unremarkable. Kidneys are normal in size, without focal lesions. A 2 mm obstructing renal calculus is seen within the proximal left ureter, with mild left-sided hydronephrosis and hydroureter. Bladder is unremarkable. Stomach/Bowel: Stomach is within normal limits. Appendix appears normal. No evidence of bowel wall thickening, distention, or inflammatory changes. Vascular/Lymphatic: No significant vascular findings are present. No enlarged abdominal or pelvic lymph nodes. Reproductive: Bilateral tubal ligation clips are seen. The  uterus and bilateral adnexa are otherwise unremarkable. Other: No abdominal wall hernia or abnormality. No abdominopelvic ascites. Musculoskeletal: No acute or significant osseous findings. IMPRESSION: 1. 2 mm obstructing renal calculus within the proximal left ureter. 2. Fatty liver. Electronically Signed   By: Aram Candela M.D.   On: 09/17/2022 22:16    Procedures Procedures    Medications Ordered in ED Medications  ondansetron (ZOFRAN) injection 4 mg (has no administration in time range)  HYDROcodone-acetaminophen (NORCO/VICODIN) 5-325 MG per tablet 1 tablet (has no administration in time range)  ketorolac (TORADOL) 30 MG/ML injection 15 mg (15 mg Intravenous Given 09/17/22 2054)  iohexol (OMNIPAQUE) 350 MG/ML injection 75 mL (75 mLs Intravenous Contrast Given 09/17/22 2202)    ED Course/ Medical Decision Making/ A&P                             Medical  Decision Making Amount and/or Complexity of Data Reviewed Labs: ordered. Decision-making details documented in ED Course. Radiology: ordered and independent interpretation performed. Decision-making details documented in ED Course.  Risk Prescription drug management.   Pt presenting today with a complaint that caries a high risk for morbidity and mortality.  Here today with sudden onset of abdominal pain.  Concern for renal stone, ischemia, perforation, diverticulitis.  Lower suspicion for ovarian pathology or ectopic pregnancy.  Patient's symptoms are not consistent with pancreatitis.  I independently interpreted patient's labs and CBC, CMP and lipase are all within normal limits.  UA does have hemoglobin but unsure if that is related to her being on her menses at this time or possibility for renal stone.  Urine pregnancy neg. Patient given pain control.  Will image to ensure no acute pathology.  10:39 PM I have independently visualized and interpreted pt's images today.  CT of abd/pelvis with signs of left sided kidney stone.    Radiology reports a 2 mm obstructing renal calculus within the proximal left ureter.  Findings were discussed with the patient and her husband.  Pain has improved after Toradol.  Will discharge home with pain control and return precautions.          Final Clinical Impression(s) / ED Diagnoses Final diagnoses:  Kidney stone    Rx / DC Orders ED Discharge Orders          Ordered    HYDROcodone-acetaminophen (NORCO/VICODIN) 5-325 MG tablet  Every 6 hours PRN        09/17/22 2239    ondansetron (ZOFRAN) 4 MG tablet  Every 8 hours PRN        09/17/22 2239              Gwyneth Sprout, MD 09/17/22 2239

## 2022-09-17 NOTE — ED Triage Notes (Signed)
Patient reports L sided abdominal pain since 1300 today. Denies n/v/d. Pain has remained constant since onset.

## 2022-09-17 NOTE — Discharge Instructions (Addendum)
If you develop uncontrolled pain, persistent vomiting or any fever you should return to the emergency room.  This kidney stone should pass on its own however if it is not passed by Monday call the specialist for evaluation.  Once the stone makes it into your bladder the pain should go away.  You are given a prescription for pain medication that you can use as needed.

## 2023-04-15 ENCOUNTER — Ambulatory Visit: Payer: Self-pay | Admitting: *Deleted

## 2023-04-15 VITALS — BP 113/78 | Wt 208.0 lb

## 2023-04-15 DIAGNOSIS — Z1239 Encounter for other screening for malignant neoplasm of breast: Secondary | ICD-10-CM

## 2023-04-15 NOTE — Progress Notes (Signed)
Ms. Amber Branch is a 43 y.o. female who presents to [facility] clinic today with no complaints.    Pap Smear: Pap smear not completed today. Last Pap smear was 05/03/2019 at [facility] clinic and was normal with negative HPV. Per patient has no history of an abnormal Pap smear. Last Pap smear result is available in Epic.    Physical exam: Breasts Breasts symmetrical. No skin abnormalities bilateral breasts. No nipple retraction bilateral breasts. No nipple discharge bilateral breasts. No lymphadenopathy. No lumps palpated bilateral breasts. No complaints of pain or tenderness on exam.      Pelvic/Bimanual Pap is not indicated today per BCCCP guidelines.    Smoking History: Patient has never smoked.   Patient Navigation: Patient education provided. Access to services provided for patient through [facility] program. Spanish interpreter Natale Lay from [facility] provided.   Breast and Cervical Cancer Risk Assessment: Patient does not have family history of breast cancer, known genetic mutations, or radiation treatment to the chest before age 53. Patient does not have history of cervical dysplasia, immunocompromised, or DES exposure in-utero.  Risk Scores as of Encounter on 04/15/2023     [name]           5-year 0.53%   Lifetime 9.22%            Last calculated by Caprice Red, CMA on 04/15/2023 at  9:54 AM        A: BCCCP exam without pap smear No complaints.  P: Referred patient to [facility] for a screening mammogram. Appointment scheduled Thursday, April 15, 2023 at 1130.  Priscille Heidelberg, RN 04/15/2023 10:09 AM

## 2023-04-15 NOTE — Patient Instructions (Signed)
Explained breast self awareness with Amber Branch. Patient did not need a Pap smear today due to last Pap smear and HPV Typing was 05/03/2019. Let her know BCCCP will cover Pap smears and HPV typing every 5 years unless has a history of abnormal Pap smears. Referred patient to [facility] for a screening mammogram. Appointment scheduled Thursday, April 15, 2023 at 1130. Patient aware of appointment and will be there. Amber Branch verbalized understanding.  Amber Branch, Amber Maser, RN 10:09 AM

## 2023-12-03 ENCOUNTER — Ambulatory Visit (INDEPENDENT_AMBULATORY_CARE_PROVIDER_SITE_OTHER): Payer: Self-pay | Admitting: Family Medicine

## 2023-12-03 ENCOUNTER — Encounter: Payer: Self-pay | Admitting: Family Medicine

## 2023-12-03 VITALS — BP 119/80 | HR 76 | Ht 60.0 in | Wt 204.4 lb

## 2023-12-03 DIAGNOSIS — R109 Unspecified abdominal pain: Secondary | ICD-10-CM

## 2023-12-03 DIAGNOSIS — Z0000 Encounter for general adult medical examination without abnormal findings: Secondary | ICD-10-CM

## 2023-12-03 DIAGNOSIS — G5601 Carpal tunnel syndrome, right upper limb: Secondary | ICD-10-CM

## 2023-12-03 DIAGNOSIS — M25511 Pain in right shoulder: Secondary | ICD-10-CM

## 2023-12-03 LAB — POCT URINALYSIS DIP (MANUAL ENTRY)
Bilirubin, UA: NEGATIVE
Blood, UA: NEGATIVE
Glucose, UA: NEGATIVE mg/dL
Ketones, POC UA: NEGATIVE mg/dL
Leukocytes, UA: NEGATIVE
Nitrite, UA: NEGATIVE
Protein Ur, POC: NEGATIVE mg/dL
Spec Grav, UA: 1.02 (ref 1.010–1.025)
Urobilinogen, UA: 0.2 U/dL
pH, UA: 5 (ref 5.0–8.0)

## 2023-12-03 LAB — POCT UA - MICROSCOPIC ONLY: RBC, Urine, Miroscopic: NONE SEEN (ref 0–2)

## 2023-12-03 LAB — POCT GLYCOSYLATED HEMOGLOBIN (HGB A1C): Hemoglobin A1C: 5.5 % (ref 4.0–5.6)

## 2023-12-03 MED ORDER — DICLOFENAC SODIUM 1 % EX GEL: 2.0000 g | Freq: Four times a day (QID) | CUTANEOUS | 1 refills | Status: AC | PRN

## 2023-12-03 NOTE — Patient Instructions (Addendum)
 Gracias por visitar la clnica hoy y permitirnos participar en su atencin.  1. Hoy le tomaremos sus anlisis de laboratorio y [employer].  2. El entumecimiento de su mano derecha podra deberse al sndrome del tnel carpiano. Por favor, use una muequera para Teacher, English as a foreign language en posicin neutra.  3. Pruebe a usar gel Voltaren  en el hombro derecho para Engineer, materials.  4. Siga con el buen veda haciendo ejercicio y ignacia dieta saludable!  Si sus sntomas no mejoran en las prximas semanas, programe una cita con su mdico de cabecera.  No dude en contactarnos si tiene alguna pregunta o inquietud. Estamos aqu para ayudarle!  Dra. Damien Cassis [facility] [phone] __________  Thank you for visiting clinic today and allowing us  to participate in your care!  We are collecting lab work today and will let you know of the results.   2. Your right hand numbness may be from carpal tunnel syndrome. Please try wearing a wrist brace to help keep your wrist in a neutral position.   3. Try using voltaren  gel over your right shoulder area for pain relief.   4. Keep up the great work with exercise and healthy diet!   If your symptoms are not improving over the next few weeks, please schedule an appointment to see your PCP.   Reach out any time with any questions or concerns you may have - we are here for you!  Damien Cassis, MD [facility] [phone]   Therapy and Counseling Resources Most providers on this list will take Medicaid. Patients with commercial insurance or Medicare should contact their insurance company to get a list of in network providers.  [facility]:Psychiatry and Counseling [address] [place], [place] [address] [phone]  [facility]  [address] [place], [place] [address]      [phone]  [facility] [address]  [place], [place]  [address] [phone]  [facility] [address], [address]  [place], [place] [address]       [phone]     [facility] (virtual only) [phone]  Janit Griffins [facility] [address] [place], [place] [phone]  [facility]:  [address] [place] [phone]  [facility]:  [address] [place] [phone]  [facility] (under & uninsured) [address]   [place] [phone]    [email or web address]    [facility] [address]  [place]    [phone]  [facility] -[address]     Phone:  [phone]     [facility]-  [address]  [phone]   [facility] [address]      [place], [place] [phone]  [facility]: [address] [place], [place], [place]  [phone]   [facility] (Spanish therapist) [email or web address]  [address] [place]  [address]   [place] [address]    [phone]    [facility]   [address],  [place], [place]  [phone]   [facility]  [address] [place]  [phone]  [facility]  [address] [place], [place]        [phone]  Open Access/Walk In Clinic under & uninsured  [facility]  [address] [place], [place] [phone] Crisis [phone]  [facility] [address],  (Spanish)   [address] , [place]: [phone]) 8:30 - 12; 1 - 2:30  [facility] [employer],  [address], [place]    [phone]):8:30 - 12; 2 - 3PM  [facility] [employer],  [address]  [place]; [phone]):   Mon - Fri 8 AM - 5 PM  [facility] [address] [place]  MWF 12:30 to 3:00 or call to schedule an appointment   [phone]  Specific Provider options Psychology Today  [email or web address]  click on find a therapist  enter your zip code left side and select or tailor a therapist for your specific need.   [facility] Provider Directory [email or web address]  (Medicaid)   Follow all drop down to find a provider  Social Support program [facility] [place] [phone] or [employer] [address], [place], [place] Recovery support and educational   24- Hour Availability:   [facility]  [address] [place], [place] [phone] Crisis [phone]  [facility] [phone]  [place] [facility]  [phone]   [facility]  [phone] (after hours)  Therapeutic Alternative/Mobile Crisis   [phone]  [place]  National Suicide Hotline  7322189881 MERRILYN)  Call 911 or go to emergency room  [facility]  [phone]);  [place] and [employer]  [phone]); [place], [place], [place], [place], [place], [place], [place]

## 2023-12-03 NOTE — Assessment & Plan Note (Signed)
 Exam most consistent with soft tissue strain/overuse, low concern for acute bony changes.  - Voltaren  gel PRN

## 2023-12-03 NOTE — Progress Notes (Signed)
    SUBJECTIVE:   Chief compliant/HPI: annual examination  Amber Branch is a 43 y.o. who presents today for an annual exam.   Current Concerns:  LLQ abdominal pain/flank  - a year ago, found to have kidney stone - no surgery, hasn't seen a passed stone  - has had strong pain intermittently ever since  - occurs when eating or drinking, happens most days  - no urinary sx - no dysuria, no urgency/frequency, hematuria  - no recent fevers  - regular BM daily/every other day - no consitpation  - no bloody or black stools   R hand numbness  -When driving or holding phone too long, R hand gets numb -Ongoing 4 months   R shoulder pain  -On going 1 year -Pain occurs when using/straining    OBJECTIVE:   BP 119/80   Pulse 76   Ht 5' (1.524 m)   Wt 204 lb 6 oz (92.7 kg)   LMP 11/11/2023   SpO2 99%   BMI 39.91 kg/m   General: Well-appearing. Resting comfortably in room. CV: Normal S1/S2. No extra heart sounds. Warm and well-perfused. Pulm: Breathing comfortably on room air. CTAB. No increased WOB. Abd: Soft, non-tender, non-distended. No CVA tenderness.  Skin:  Warm, dry. Psych: Pleasant and appropriate.  MSK: Positive Phalen test on R hand.  Mild discomfort to palpation of R trapezius, nontender to palpation of R shoulder. Normal ROM of bilateral upper extremities.   ASSESSMENT/PLAN:   Assessment & Plan Flank pain Unclear etiology. Benign exam today. Vitals stable. Chronicity unlikely for ongoing renal stone. Location less likely gallbladder etiology.  - UA, CMP ordered  Carpal tunnel syndrome of right wrist History and exam most consistent with carpal tunnel syndrome.  - Discussed keeping wrist in neutral position, wearing brace as needed Right shoulder pain, unspecified chronicity Exam most consistent with soft tissue strain/overuse, low concern for acute bony changes.  - Voltaren  gel PRN  Annual Examination  See AVS for age appropriate recommendations.   PHQ  score 2 - low concern for depression.  Blood pressure reviewed and at goal.  The patient is s/p BTL for contraception.   Considered the following items based upon USPSTF recommendations: Diabetes screening: ordered Screening for elevated cholesterol: ordered HIV testing: negative in 2015 Hepatitis C: negative in 2023  Discussed family history: No known FMH of breast, ovarian, uterine, or colon cancer. Cervical cancer screening: Last pap in 04/2019 - normal, negative HPV Breast cancer screening: Last mammogram 03/2023 - normal   Follow up in 1 year or sooner if indicated.  MyChart Activation: Already signed up  Damien Cassis, MD [facility]

## 2023-12-04 LAB — COMPREHENSIVE METABOLIC PANEL WITH GFR
ALT: 19 IU/L (ref 0–32)
AST: 19 IU/L (ref 0–40)
Albumin: 4.5 g/dL (ref 3.9–4.9)
Alkaline Phosphatase: 88 IU/L (ref 44–121)
BUN/Creatinine Ratio: 22 (ref 9–23)
BUN: 11 mg/dL (ref 6–24)
Bilirubin Total: 0.4 mg/dL (ref 0.0–1.2)
CO2: 19 mmol/L — ABNORMAL LOW (ref 20–29)
Calcium: 9.3 mg/dL (ref 8.7–10.2)
Chloride: 100 mmol/L (ref 96–106)
Creatinine, Ser: 0.5 mg/dL — ABNORMAL LOW (ref 0.57–1.00)
Globulin, Total: 2.6 g/dL (ref 1.5–4.5)
Glucose: 98 mg/dL (ref 70–99)
Potassium: 4.5 mmol/L (ref 3.5–5.2)
Sodium: 138 mmol/L (ref 134–144)
Total Protein: 7.1 g/dL (ref 6.0–8.5)
eGFR: 119 mL/min/1.73 (ref >59)

## 2023-12-04 LAB — LIPID PANEL
Chol/HDL Ratio: 6.1 ratio — ABNORMAL HIGH (ref 0.0–4.4)
Cholesterol, Total: 225 mg/dL — ABNORMAL HIGH (ref 100–199)
HDL: 37 mg/dL — ABNORMAL LOW (ref >39)
LDL Chol Calc (NIH): 118 mg/dL — ABNORMAL HIGH (ref 0–99)
Triglycerides: 400 mg/dL — ABNORMAL HIGH (ref 0–149)
VLDL Cholesterol Cal: 70 mg/dL — ABNORMAL HIGH (ref 5–40)

## 2023-12-06 ENCOUNTER — Ambulatory Visit: Payer: Self-pay | Admitting: Family Medicine

## 2023-12-06 NOTE — Telephone Encounter (Signed)
 Patient calls nurse line. She states that she just missed a call from provider.   Appears that provider may have been trying to call to discuss recent lab results.   Will forward to provider. Please let me know if I can relay message to patient.   Requesting returned call at [phone].  Thanks.   Chiquita JAYSON English, RN
